# Patient Record
Sex: Male | Born: 1960 | Race: White | Hispanic: No | Marital: Married | Smoking: Former smoker
Health system: Southern US, Community
[De-identification: ages and names within clinical notes are randomized; demographics above are authoritative.]

## PROBLEM LIST (undated history)

## (undated) DIAGNOSIS — Z9889 Other specified postprocedural states: Secondary | ICD-10-CM

## (undated) DIAGNOSIS — L309 Dermatitis, unspecified: Secondary | ICD-10-CM

## (undated) DIAGNOSIS — L409 Psoriasis, unspecified: Secondary | ICD-10-CM

## (undated) DIAGNOSIS — F419 Anxiety disorder, unspecified: Secondary | ICD-10-CM

## (undated) DIAGNOSIS — R112 Nausea with vomiting, unspecified: Secondary | ICD-10-CM

## (undated) DIAGNOSIS — E785 Hyperlipidemia, unspecified: Secondary | ICD-10-CM

## (undated) DIAGNOSIS — K219 Gastro-esophageal reflux disease without esophagitis: Secondary | ICD-10-CM

## (undated) DIAGNOSIS — E119 Type 2 diabetes mellitus without complications: Secondary | ICD-10-CM

## (undated) DIAGNOSIS — E1165 Type 2 diabetes mellitus with hyperglycemia: Secondary | ICD-10-CM

## (undated) DIAGNOSIS — T7840XA Allergy, unspecified, initial encounter: Secondary | ICD-10-CM

## (undated) DIAGNOSIS — J9819 Other pulmonary collapse: Secondary | ICD-10-CM

## (undated) DIAGNOSIS — Z8619 Personal history of other infectious and parasitic diseases: Secondary | ICD-10-CM

## (undated) DIAGNOSIS — M1990 Unspecified osteoarthritis, unspecified site: Secondary | ICD-10-CM

## (undated) DIAGNOSIS — E039 Hypothyroidism, unspecified: Secondary | ICD-10-CM

## (undated) DIAGNOSIS — R011 Cardiac murmur, unspecified: Secondary | ICD-10-CM

## (undated) HISTORY — PX: COLONOSCOPY W/ POLYPECTOMY: SHX1380

## (undated) HISTORY — PX: CYST REMOVAL HAND: SHX6279

## (undated) HISTORY — DX: Dermatitis, unspecified: L30.9

## (undated) HISTORY — PX: SHOULDER ARTHROSCOPY WITH SUBACROMIAL DECOMPRESSION: SHX5684

## (undated) HISTORY — PX: JOINT REPLACEMENT: SHX530

## (undated) HISTORY — PX: INCISION AND DRAINAGE PERIRECTAL ABSCESS: SHX1804

## (undated) HISTORY — DX: Type 2 diabetes mellitus without complications: E11.9

## (undated) HISTORY — PX: WISDOM TOOTH EXTRACTION: SHX21

## (undated) HISTORY — PX: CHEST TUBE INSERTION: SHX231

## (undated) HISTORY — PX: SHOULDER ARTHROSCOPY W/ LABRAL REPAIR: SHX2399

## (undated) HISTORY — DX: Allergy, unspecified, initial encounter: T78.40XA

## (undated) HISTORY — DX: Type 2 diabetes mellitus with hyperglycemia: E11.65

## (undated) HISTORY — DX: Personal history of other infectious and parasitic diseases: Z86.19

## (undated) HISTORY — PX: OTHER SURGICAL HISTORY: SHX169

---

## 2001-01-12 HISTORY — PX: SHOULDER ARTHROSCOPY WITH ROTATOR CUFF REPAIR: SHX5685

## 2007-01-13 HISTORY — PX: ELBOW SURGERY: SHX618

## 2009-01-12 HISTORY — PX: COLONOSCOPY: SHX174

## 2009-01-12 HISTORY — PX: ROTATOR CUFF REPAIR: SHX139

## 2014-01-25 ENCOUNTER — Ambulatory Visit: Payer: BLUE CROSS/BLUE SHIELD | Attending: Orthopaedic Surgery | Admitting: Physical Therapy

## 2014-01-25 DIAGNOSIS — M545 Low back pain: Secondary | ICD-10-CM | POA: Diagnosis not present

## 2014-01-25 DIAGNOSIS — M79604 Pain in right leg: Secondary | ICD-10-CM | POA: Diagnosis not present

## 2014-01-25 DIAGNOSIS — M479 Spondylosis, unspecified: Secondary | ICD-10-CM | POA: Diagnosis not present

## 2014-01-25 DIAGNOSIS — Q057 Lumbar spina bifida without hydrocephalus: Secondary | ICD-10-CM | POA: Diagnosis not present

## 2014-01-26 ENCOUNTER — Ambulatory Visit: Payer: BLUE CROSS/BLUE SHIELD | Admitting: Physical Therapy

## 2014-01-26 DIAGNOSIS — M545 Low back pain: Secondary | ICD-10-CM | POA: Diagnosis not present

## 2014-02-09 ENCOUNTER — Ambulatory Visit: Payer: BLUE CROSS/BLUE SHIELD | Admitting: Physical Therapy

## 2014-02-09 DIAGNOSIS — M545 Low back pain: Secondary | ICD-10-CM | POA: Diagnosis not present

## 2014-02-16 ENCOUNTER — Ambulatory Visit: Payer: BLUE CROSS/BLUE SHIELD | Attending: Orthopaedic Surgery | Admitting: Physical Therapy

## 2014-02-16 DIAGNOSIS — M545 Low back pain: Secondary | ICD-10-CM | POA: Insufficient documentation

## 2014-02-16 DIAGNOSIS — M79604 Pain in right leg: Secondary | ICD-10-CM | POA: Insufficient documentation

## 2014-02-16 DIAGNOSIS — M479 Spondylosis, unspecified: Secondary | ICD-10-CM | POA: Insufficient documentation

## 2014-02-16 DIAGNOSIS — Q057 Lumbar spina bifida without hydrocephalus: Secondary | ICD-10-CM | POA: Insufficient documentation

## 2014-02-19 ENCOUNTER — Ambulatory Visit: Payer: BLUE CROSS/BLUE SHIELD | Admitting: Physical Therapy

## 2014-02-20 ENCOUNTER — Ambulatory Visit: Payer: BLUE CROSS/BLUE SHIELD | Admitting: Physical Therapy

## 2014-02-20 DIAGNOSIS — M545 Low back pain: Secondary | ICD-10-CM | POA: Diagnosis not present

## 2014-02-23 ENCOUNTER — Encounter: Payer: BLUE CROSS/BLUE SHIELD | Admitting: Physical Therapy

## 2014-03-02 ENCOUNTER — Ambulatory Visit: Payer: BLUE CROSS/BLUE SHIELD | Admitting: Physical Therapy

## 2014-03-02 ENCOUNTER — Encounter: Payer: Self-pay | Admitting: Physical Therapy

## 2014-03-02 DIAGNOSIS — M545 Low back pain: Secondary | ICD-10-CM | POA: Diagnosis not present

## 2014-03-02 DIAGNOSIS — M5137 Other intervertebral disc degeneration, lumbosacral region: Secondary | ICD-10-CM

## 2014-03-02 NOTE — Therapy (Signed)
[facility] [address] [place], [place], [place], [address] Phone: [phone]   Fax:  [phone]  Physical Therapy Treatment  Patient Details  Name: Shaun Velasquez MRN: [id number] Date of Birth: 1960/03/21 Referring Provider:  Lauraine Rinne, MD  Encounter Date: 03/02/2014      PT End of Session - 03/02/14 1153    Visit Number 5   Number of Visits 16   Date for PT Re-Evaluation 03/22/14   PT Start Time 1100   PT Stop Time 1146   PT Time Calculation (min) 46 min   Activity Tolerance Patient tolerated treatment well   Behavior During Therapy [facility] for tasks assessed/performed      No past medical history on file.  No past surgical history on file.  There were no vitals taken for this visit.  Visit Diagnosis:  Degeneration of lumbar or lumbosacral intervertebral disc      Subjective Assessment - 03/02/14 1113    Symptoms pain in lower back right side   Limitations --  bending, difficult placing on shoes or socks   How long can you sit comfortably? need to change positions, not more than an hour   How long can you stand comfortably? 20 to 30 min   How long can you walk comfortably? 45 minutes   Currently in Pain? Yes   Pain Score 2    Pain Location Back   Pain Orientation Right;Lower   Pain Descriptors / Indicators Constant;Aching;Radiating   Pain Type Chronic pain   Pain Onset More than a month ago   Pain Frequency Constant   Effect of Pain on Daily Activities limited with his perswonal exercise routine, placing on socks and shoes, bending    Multiple Pain Sites No                    [facility] Adult PT Treatment/Exercise - 03/02/14 0001    Exercises   Exercises Knee/Hip;Lumbar   Lumbar Exercises: Stretches   Active Hamstring Stretch 3 reps;20 seconds   Single Knee to Chest Stretch 3 reps;20 seconds   Lower Trunk Rotation 3 reps;20 seconds   Knee/Hip Exercises: Aerobic   Stationary Bike level 1  x17min                   PT Short Term Goals - 03/02/14 1157    PT SHORT TERM GOAL #1   Title see LTG           PT Long Term Goals - 03/02/14 1157    PT LONG TERM GOAL #1   Title demonstrate and/or verbalize techniques to reduce the risk of re-injury to include info on:  posture, body mechanics, lifting   Time 8   Period Weeks   Status Achieved   PT LONG TERM GOAL #2   Title be independent with advanced HEP   Time 8   Period Weeks   Status On-going   PT LONG TERM GOAL #3   Title sit for 1-2 hour plane ride with pain decreased >/= 75% and is intermittent   Time 8   Period Weeks   Status On-going   PT LONG TERM GOAL #4   Title return to raod bidking with pain reduced >/= 75%   Time 8   Period Weeks   Status On-going   PT LONG TERM GOAL #5   Title return to yardwork with correct body mechanics   Time 8   Period Weeks   Status On-going  Problem List There are no active problems to display for this patient.   NAUMANN-HOUEGNIFIO,Afnan Emberton PTA 03/02/2014, 12:17 PM  [facility] [address] [place], [place], [place], [address] Phone: [phone]   Fax:  [phone]

## 2014-03-02 NOTE — Patient Instructions (Addendum)
  Double Knee to Chest (Flexion)   Gently pull both knees toward chest. Feel stretch in lower back or buttock area. Breathing deeply, Hold _20___ seconds. Repeat __3__ times. Do __2-3__ sessions per day.  http://gt2.exer.us/227   Copyright  VHI. All rights reserved.  Hip Rotation in Supine   Lie on back, legs over ball. Rotate legs from side to side. Do ___ sets of ___ repetitions.  Copyright  VHI. All rights reserved.   Hamstring Step 1   Straighten left knee. Keep knee level with other knee or on bolster. Hold __20_ seconds. Relax knee by returning foot to start. Repeat _3__ times.  Copyright  VHI. All rights reserved.  Supine Knee-to-Chest, Unilateral   Lie on back, hands clasped behind one knee. Pull knee in toward chest until a comfortable stretch is felt in lower back and buttocks. Hold __20_ seconds.  Repeat _3__ times per session. Do _2-3__ sessions per day.  Copyright  VHI. All rights reserved.

## 2014-03-09 ENCOUNTER — Ambulatory Visit: Payer: BLUE CROSS/BLUE SHIELD | Admitting: Physical Therapy

## 2014-03-09 ENCOUNTER — Encounter: Payer: Self-pay | Admitting: Physical Therapy

## 2014-03-09 DIAGNOSIS — M5137 Other intervertebral disc degeneration, lumbosacral region: Secondary | ICD-10-CM

## 2014-03-09 DIAGNOSIS — M545 Low back pain: Secondary | ICD-10-CM | POA: Diagnosis not present

## 2014-03-09 NOTE — Therapy (Signed)
[facility] [address] [place], [place], [address] Phone: [phone]   Fax:  [phone]  Physical Therapy Treatment  Patient Details  Name: Shaun Velasquez MRN: [id number] Date of Birth: 1960/04/03 Referring Provider:  Marybelle Killings, MD  Encounter Date: 03/09/2014      PT End of Session - 03/09/14 1158    Visit Number 6   Number of Visits 16   Date for PT Re-Evaluation 03/22/14   PT Start Time 1100   PT Stop Time 1150   PT Time Calculation (min) 50 min   Activity Tolerance Patient tolerated treatment well   Behavior During Therapy [facility] for tasks assessed/performed      History reviewed. No pertinent past medical history.  History reviewed. No pertinent past surgical history.  There were no vitals taken for this visit.  Visit Diagnosis:  Degeneration of lumbar or lumbosacral intervertebral disc - Plan: CANCELED: PT plan of care cert/re-cert      Subjective Assessment - 03/09/14 1107    Symptoms I am seeing the doctor today. No significant improvement with therapy. Patient is having right lumbar into right posterior thigh.  Right thigh pain is worse.    Limitations House hold activities;Sitting  trouble bending forward to tie shoes   How long can you sit comfortably? freqently changes postion and unable to sit more than an hour.    How long can you stand comfortably? 20-30 minutes   How long can you walk comfortably? 45 minutes   Currently in Pain? Yes   Pain Score 3    Pain Location Back   Pain Orientation Right;Lower   Pain Descriptors / Indicators Constant;Aching;Radiating   Pain Type Chronic pain   Pain Onset More than a month ago   Pain Frequency Constant   Aggravating Factors  sitting and walking.    Effect of Pain on Daily Activities limited with his personal exercise routing, placing on socks and shoes, bending   Multiple Pain Sites Yes   Pain Score 2   Wong-Baker Pain Rating Hurts a little bit    Pain Type Chronic pain   Pain Location Leg   Pain Orientation Right   Pain Radiating Towards towards right calf   Pain Descriptors / Indicators Radiating   Pain Frequency Constant   Pain Onset Gradual          [facility] PT Assessment - 03/09/14 0001    ROM / Strength   AROM / PROM / Strength --  lumbar flexion is full, abdominal strength is 4/5   Straight Leg Raise   Findings Positive   Side  Right   Comment 55 degrees with increased pain 5/10                  [facility] Adult PT Treatment/Exercise - 03/09/14 0001    Lumbar Exercises: Stretches   Active Hamstring Stretch 3 reps;20 seconds  increased pain in right ischial tuberosity area, 5-6/10   Lumbar Exercises: Prone   Opposite Arm/Leg Raise Limitations 5 x  pillow under pelvis with VC to not lift where pain is    Knee/Hip Exercises: Stretches   Piriformis Stretch 2 reps;30 seconds   Modalities   Modalities Ultrasound   Ultrasound   Ultrasound Location right gluteal   Ultrasound Parameters 100%, 77mz, 1.2w/cm2   Ultrasound Goals Pain   Manual Therapy   Manual Therapy Massage   Massage right gluteal area  PT Education - 03/09/14 1158    Education provided Yes   Person(s) Educated Patient   Methods Explanation;Demonstration;Tactile cues   Comprehension Verbalized understanding;Returned demonstration;Verbal cues required          PT Short Term Goals - 03/09/14 1544    PT SHORT TERM GOAL #1   Title see LTG           PT Long Term Goals - 03/09/14 1150    PT LONG TERM GOAL #1   Title demonstrate and/or verbalize techniques to reduce the risk of re-injury to include info on:  posture, body mechanics, lifting   Time 8   Period Weeks   Status Achieved   PT LONG TERM GOAL #2   Title be independent with advanced HEP   Time 8   Period Weeks   Status Achieved   PT LONG TERM GOAL #3   Title sit for 1-2 hour plane ride with pain decreased >/= 75% and is intermittent  only able to  sit for 1 hour   Time 8   Period Weeks   Status Not Met   PT LONG TERM GOAL #4   Title return to road biking with pain reduced >/= 75%  not able to road bike due to pain   Time 8   Status Not Met   PT LONG TERM GOAL #5   Title return to yardwork with correct body mechanics  not met due to pain   Time 8   Period Weeks   Status Not Met               Plan - 03/09/14 1159    Clinical Impression Statement Patient continues to have a spasm in right gluteal.  Patient has made slight improvement in pain but only will last for 1 day.    Rehab Potential Good   PT Treatment/Interventions Therapeutic exercise;Ultrasound;Manual techniques   PT Next Visit Plan Discharge patient to a HEP   Consulted and Agree with Plan of Care Patient        Problem List There are no active problems to display for this patient.   Essa Malachi, PT 03/09/2014, 4:09 PM  [facility] [address], [place], [place], [address] Phone: [phone]   Fax:  [phone]  PHYSICAL THERAPY DISCHARGE SUMMARY  Visits from Start of Care: 6 Current functional level related to goals / functional outcomes: See above note for measurements and goals update.  Patient is not able to sit more than 1 hour.  Patient has not returned to yard work or road bike due to pain.   Remaining deficits: Patient continues to have right lumbar pain that will radiate into the calf.  Patient has not made significant changes with his pain.    Education / Equipment: HEP Plan: Patient agrees to discharge.  Patient goals were partially met. Patient is being discharged due to lack of progress. Thank you for the referral. ?????     Earlie Counts PT

## 2014-03-09 NOTE — Patient Instructions (Signed)
Reviewed patients past HEP.  Patient is able to return demonstration correctly after minimal verbal cues.

## 2014-03-12 ENCOUNTER — Other Ambulatory Visit: Payer: Self-pay | Admitting: Orthopaedic Surgery

## 2014-03-12 DIAGNOSIS — M549 Dorsalgia, unspecified: Secondary | ICD-10-CM

## 2014-03-18 ENCOUNTER — Ambulatory Visit
Admission: RE | Admit: 2014-03-18 | Discharge: 2014-03-18 | Disposition: A | Discharge status: Home / Self Care | Payer: BLUE CROSS/BLUE SHIELD | Source: Ambulatory Visit | Attending: Orthopaedic Surgery | Admitting: Orthopaedic Surgery

## 2014-03-18 DIAGNOSIS — M549 Dorsalgia, unspecified: Secondary | ICD-10-CM

## 2014-03-22 ENCOUNTER — Other Ambulatory Visit: Payer: Self-pay | Admitting: Orthopaedic Surgery

## 2014-03-22 DIAGNOSIS — M79605 Pain in left leg: Secondary | ICD-10-CM

## 2014-03-22 DIAGNOSIS — M545 Low back pain: Secondary | ICD-10-CM

## 2014-03-22 DIAGNOSIS — M79604 Pain in right leg: Secondary | ICD-10-CM

## 2014-03-23 ENCOUNTER — Ambulatory Visit
Admission: RE | Admit: 2014-03-23 | Discharge: 2014-03-23 | Disposition: A | Discharge status: Home / Self Care | Payer: BLUE CROSS/BLUE SHIELD | Source: Ambulatory Visit | Attending: Orthopaedic Surgery | Admitting: Orthopaedic Surgery

## 2014-03-23 DIAGNOSIS — M545 Low back pain: Secondary | ICD-10-CM

## 2014-03-23 DIAGNOSIS — M79604 Pain in right leg: Secondary | ICD-10-CM

## 2014-03-23 MED ORDER — IOHEXOL 180 MG/ML  SOLN
1.0000 mL | Freq: Once | INTRAMUSCULAR | Status: AC | PRN
  Administered 2014-03-23: 1 mL via EPIDURAL

## 2014-03-23 MED ORDER — METHYLPREDNISOLONE ACETATE 40 MG/ML INJ SUSP (RADIOLOG
120.0000 mg | Freq: Once | INTRAMUSCULAR | Status: AC
  Administered 2014-03-23: 120 mg via EPIDURAL

## 2014-03-23 NOTE — Discharge Instructions (Signed)

## 2014-12-13 ENCOUNTER — Encounter (HOSPITAL_COMMUNITY): Payer: Self-pay | Admitting: Pharmacy Technician

## 2014-12-13 NOTE — Pre-Procedure Instructions (Signed)
Shaun Velasquez  12/13/2014     Your procedure is scheduled on : Thursday December 27, 2014 at 7:30 AM.  Report to [facility] Admitting at 5:30 AM.  Call this number if you have problems the morning of surgery: [phone]    Remember:  Do not eat food or drink liquids after midnight.  Take these medicines the morning of surgery with A SIP OF WATER : Levothyroxine (Synthroid), Omeprazole (Prilosec)   Stop taking any vitamins, herbal medications, Ibuprofen, Advil, Motrin, Aleve, etc on Thursday December 8th   Do not wear jewelry.  Do not wear lotions, powders, or cologne.    Men may shave face and neck.  Do not bring valuables to the hospital.  [facility] is not responsible for any belongings or valuables.  Contacts, dentures or bridgework may not be worn into surgery.  Leave your suitcase in the car.  After surgery it may be brought to your room.  For patients admitted to the hospital, discharge time will be determined by your treatment team.  Patients discharged the day of surgery will not be allowed to drive home.   Name and phone number of your driver:    Special instructions:  Shower using CHG soap the night before and the morning of your surgery  Please read over the following fact sheets that you were given. Pain Booklet, Coughing and Deep Breathing, MRSA Information and Surgical Site Infection Prevention

## 2014-12-14 ENCOUNTER — Encounter (HOSPITAL_COMMUNITY): Payer: Self-pay

## 2014-12-14 ENCOUNTER — Encounter (HOSPITAL_COMMUNITY)
Admission: RE | Admit: 2014-12-14 | Discharge: 2014-12-14 | Disposition: A | Discharge status: Home / Self Care | Payer: BLUE CROSS/BLUE SHIELD | Source: Ambulatory Visit | Attending: Orthopedic Surgery | Admitting: Orthopedic Surgery

## 2014-12-14 ENCOUNTER — Other Ambulatory Visit (HOSPITAL_COMMUNITY): Payer: BLUE CROSS/BLUE SHIELD

## 2014-12-14 DIAGNOSIS — Z01812 Encounter for preprocedural laboratory examination: Secondary | ICD-10-CM | POA: Diagnosis not present

## 2014-12-14 DIAGNOSIS — M75101 Unspecified rotator cuff tear or rupture of right shoulder, not specified as traumatic: Secondary | ICD-10-CM | POA: Insufficient documentation

## 2014-12-14 HISTORY — DX: Nausea with vomiting, unspecified: R11.2

## 2014-12-14 HISTORY — DX: Hypothyroidism, unspecified: E03.9

## 2014-12-14 HISTORY — DX: Hyperlipidemia, unspecified: E78.5

## 2014-12-14 HISTORY — DX: Anxiety disorder, unspecified: F41.9

## 2014-12-14 HISTORY — DX: Other pulmonary collapse: J98.19

## 2014-12-14 HISTORY — DX: Psoriasis, unspecified: L40.9

## 2014-12-14 HISTORY — DX: Gastro-esophageal reflux disease without esophagitis: K21.9

## 2014-12-14 HISTORY — DX: Unspecified osteoarthritis, unspecified site: M19.90

## 2014-12-14 HISTORY — DX: Other specified postprocedural states: Z98.890

## 2014-12-14 LAB — COMPREHENSIVE METABOLIC PANEL
ALT: 53 U/L (ref 17–63)
AST: 34 U/L (ref 15–41)
Albumin: 4.3 g/dL (ref 3.5–5.0)
Alkaline Phosphatase: 68 U/L (ref 38–126)
Anion gap: 10 (ref 5–15)
BUN: 15 mg/dL (ref 6–20)
CO2: 24 mmol/L (ref 22–32)
Calcium: 9.5 mg/dL (ref 8.9–10.3)
Chloride: 101 mmol/L (ref 101–111)
Creatinine, Ser: 0.92 mg/dL (ref 0.61–1.24)
GFR calc Af Amer: >60 mL/min (ref >60)
GFR calc non Af Amer: >60 mL/min (ref >60)
Glucose, Bld: 134 mg/dL — ABNORMAL HIGH (ref 65–99)
Potassium: 4.3 mmol/L (ref 3.5–5.1)
Sodium: 135 mmol/L (ref 135–145)
Total Bilirubin: 0.9 mg/dL (ref 0.3–1.2)
Total Protein: 7.2 g/dL (ref 6.5–8.1)

## 2014-12-14 LAB — CBC WITH DIFFERENTIAL/PLATELET
Basophils Absolute: 0.1 10*3/uL (ref 0.0–0.1)
Basophils Relative: 1 %
Eosinophils Absolute: 0.2 10*3/uL (ref 0.0–0.7)
Eosinophils Relative: 3 %
HCT: 44.6 % (ref 39.0–52.0)
Hemoglobin: 14.9 g/dL (ref 13.0–17.0)
Lymphocytes Relative: 25 %
Lymphs Abs: 1.7 10*3/uL (ref 0.7–4.0)
MCH: 29.3 pg (ref 26.0–34.0)
MCHC: 33.4 g/dL (ref 30.0–36.0)
MCV: 87.6 fL (ref 78.0–100.0)
Monocytes Absolute: 0.4 10*3/uL (ref 0.1–1.0)
Monocytes Relative: 6 %
Neutro Abs: 4.5 10*3/uL (ref 1.7–7.7)
Neutrophils Relative %: 65 %
Platelets: 189 10*3/uL (ref 150–400)
RBC: 5.09 MIL/uL (ref 4.22–5.81)
RDW: 12.6 % (ref 11.5–15.5)
WBC: 6.8 10*3/uL (ref 4.0–10.5)

## 2014-12-14 LAB — SURGICAL PCR SCREEN
MRSA, PCR: NEGATIVE
Staphylococcus aureus: POSITIVE — AB

## 2014-12-14 LAB — PROTIME-INR
INR: 0.98 (ref 0.00–1.49)
Prothrombin Time: 13.2 seconds (ref 11.6–15.2)

## 2014-12-14 LAB — APTT: aPTT: 32 seconds (ref 24–37)

## 2014-12-14 NOTE — Progress Notes (Signed)
PCP is [name]  Patient denied having any cardiac or pulmonary issues, but did inform Nurse that he developed a "head cold" and a cough over Thanksgiving. Patient informed Nurse that he is coughing up some clear sputum, but he feels ok. Patient stated he had the same thing last year and went to his PCP office where he had a chest xray, and everything was fine. Nurse instructed patient to return to his PCP IF symptoms persist or became worse. Patient verbalized understanding.   Patients wife at chair side during PAT visit.

## 2014-12-14 NOTE — Progress Notes (Signed)
   12/14/14 0924  OBSTRUCTIVE SLEEP APNEA  Have you ever been diagnosed with sleep apnea through a sleep study? No  Do you snore loudly (loud enough to be heard through closed doors)?  1  Do you often feel tired, fatigued, or sleepy during the daytime (such as falling asleep during driving or talking to someone)? 1  Has anyone observed you stop breathing during your sleep? 0  Do you have, or are you being treated for high blood pressure? 0  BMI more than 35 kg/m2? 0  Age > 50 (1-yes) 1  Neck circumference greater than:Male 16 inches or larger, Male 17inches or larger? 1  Male Gender (Yes=1) 1  Obstructive Sleep Apnea Score 5  Score 5 or greater  Results sent to PCP   This patient has screened at risk for sleep apnea using the STOP Bang tool used during a pre-surgical visit. A score of 4 or greater is at risk for sleep apnea.

## 2014-12-14 NOTE — Progress Notes (Signed)
Nurse called prescription for Mupirocin ointment into [facility] pharmacy and then called patient and informed him of positive PCR results. Nurse informed patient to pick up ointment and began treatment. Patient verbalized understanding.

## 2014-12-27 ENCOUNTER — Observation Stay (HOSPITAL_COMMUNITY)
Admission: RE | Admit: 2014-12-27 | Discharge: 2014-12-28 | Disposition: A | Discharge status: Home / Self Care | Payer: BLUE CROSS/BLUE SHIELD | Source: Ambulatory Visit | Attending: Orthopedic Surgery | Admitting: Orthopedic Surgery

## 2014-12-27 ENCOUNTER — Ambulatory Visit (HOSPITAL_COMMUNITY): Payer: BLUE CROSS/BLUE SHIELD | Admitting: Certified Registered Nurse Anesthetist

## 2014-12-27 ENCOUNTER — Encounter (HOSPITAL_COMMUNITY)
Admission: RE | Disposition: A | Discharge status: Home / Self Care | Payer: Self-pay | Source: Ambulatory Visit | Attending: Orthopedic Surgery

## 2014-12-27 ENCOUNTER — Encounter (HOSPITAL_COMMUNITY): Payer: Self-pay | Admitting: Surgery

## 2014-12-27 DIAGNOSIS — Z96619 Presence of unspecified artificial shoulder joint: Secondary | ICD-10-CM

## 2014-12-27 DIAGNOSIS — M75101 Unspecified rotator cuff tear or rupture of right shoulder, not specified as traumatic: Secondary | ICD-10-CM | POA: Diagnosis not present

## 2014-12-27 DIAGNOSIS — E785 Hyperlipidemia, unspecified: Secondary | ICD-10-CM | POA: Diagnosis not present

## 2014-12-27 DIAGNOSIS — L409 Psoriasis, unspecified: Secondary | ICD-10-CM | POA: Diagnosis not present

## 2014-12-27 DIAGNOSIS — E039 Hypothyroidism, unspecified: Secondary | ICD-10-CM | POA: Insufficient documentation

## 2014-12-27 DIAGNOSIS — F419 Anxiety disorder, unspecified: Secondary | ICD-10-CM | POA: Diagnosis not present

## 2014-12-27 DIAGNOSIS — Z96611 Presence of right artificial shoulder joint: Secondary | ICD-10-CM

## 2014-12-27 DIAGNOSIS — K219 Gastro-esophageal reflux disease without esophagitis: Secondary | ICD-10-CM | POA: Diagnosis not present

## 2014-12-27 DIAGNOSIS — Z87891 Personal history of nicotine dependence: Secondary | ICD-10-CM | POA: Diagnosis not present

## 2014-12-27 DIAGNOSIS — M19011 Primary osteoarthritis, right shoulder: Secondary | ICD-10-CM | POA: Insufficient documentation

## 2014-12-27 DIAGNOSIS — Z79899 Other long term (current) drug therapy: Secondary | ICD-10-CM | POA: Insufficient documentation

## 2014-12-27 HISTORY — PX: REVERSE SHOULDER ARTHROPLASTY: SHX5054

## 2014-12-27 SURGERY — ARTHROPLASTY, SHOULDER, TOTAL, REVERSE
Anesthesia: Regional | Site: Shoulder | Laterality: Right

## 2014-12-27 MED ORDER — METHOCARBAMOL 1000 MG/10ML IJ SOLN
500.0000 mg | Freq: Four times a day (QID) | INTRAVENOUS | Status: DC | PRN
  Filled 2014-12-27: qty 5

## 2014-12-27 MED ORDER — SUCCINYLCHOLINE CHLORIDE 20 MG/ML IJ SOLN
INTRAMUSCULAR | Status: AC
  Filled 2014-12-27: qty 1

## 2014-12-27 MED ORDER — BISACODYL 5 MG PO TBEC: 5.0000 mg | DELAYED_RELEASE_TABLET | Freq: Every day | ORAL | Status: DC | PRN

## 2014-12-27 MED ORDER — GLYCOPYRROLATE 0.2 MG/ML IJ SOLN
INTRAMUSCULAR | Status: DC | PRN
  Administered 2014-12-27: 0.2 mg via INTRAVENOUS

## 2014-12-27 MED ORDER — HYDROMORPHONE HCL 1 MG/ML IJ SOLN
1.0000 mg | INTRAMUSCULAR | Status: DC | PRN
  Administered 2014-12-27: 1 mg via INTRAVENOUS
  Filled 2014-12-27: qty 1

## 2014-12-27 MED ORDER — FENTANYL CITRATE (PF) 250 MCG/5ML IJ SOLN
INTRAMUSCULAR | Status: AC
  Filled 2014-12-27: qty 5

## 2014-12-27 MED ORDER — ONDANSETRON HCL 4 MG/2ML IJ SOLN: 4.0000 mg | Freq: Four times a day (QID) | INTRAMUSCULAR | Status: DC | PRN

## 2014-12-27 MED ORDER — ALUM & MAG HYDROXIDE-SIMETH 200-200-20 MG/5ML PO SUSP: 30.0000 mL | ORAL | Status: DC | PRN

## 2014-12-27 MED ORDER — LACTATED RINGERS IV SOLN
INTRAVENOUS | Status: DC
  Administered 2014-12-27: 15:00:00 via INTRAVENOUS

## 2014-12-27 MED ORDER — CEFAZOLIN SODIUM-DEXTROSE 2-3 GM-% IV SOLR
2.0000 g | INTRAVENOUS | Status: AC
  Administered 2014-12-27: 2 g via INTRAVENOUS

## 2014-12-27 MED ORDER — ONDANSETRON HCL 4 MG/2ML IJ SOLN
INTRAMUSCULAR | Status: DC | PRN
  Administered 2014-12-27: 4 mg via INTRAVENOUS

## 2014-12-27 MED ORDER — KETOROLAC TROMETHAMINE 15 MG/ML IJ SOLN
INTRAMUSCULAR | Status: AC
  Filled 2014-12-27: qty 1

## 2014-12-27 MED ORDER — LACTATED RINGERS IV SOLN
INTRAVENOUS | Status: DC | PRN
  Administered 2014-12-27 (×2): via INTRAVENOUS

## 2014-12-27 MED ORDER — OXYCODONE HCL 5 MG PO TABS
ORAL_TABLET | ORAL | Status: AC
  Administered 2014-12-27: 10 mg via ORAL
  Filled 2014-12-27: qty 2

## 2014-12-27 MED ORDER — CEFAZOLIN SODIUM-DEXTROSE 2-3 GM-% IV SOLR
2.0000 g | Freq: Four times a day (QID) | INTRAVENOUS | Status: AC
  Administered 2014-12-27 – 2014-12-28 (×3): 2 g via INTRAVENOUS
  Filled 2014-12-27 (×4): qty 50

## 2014-12-27 MED ORDER — LACTATED RINGERS IV SOLN: INTRAVENOUS | Status: DC

## 2014-12-27 MED ORDER — HYDROMORPHONE HCL 1 MG/ML IJ SOLN
0.5000 mg | INTRAMUSCULAR | Status: DC | PRN
  Administered 2014-12-27 (×2): 0.5 mg via INTRAVENOUS

## 2014-12-27 MED ORDER — CEFAZOLIN SODIUM-DEXTROSE 2-3 GM-% IV SOLR
INTRAVENOUS | Status: AC
  Filled 2014-12-27: qty 50

## 2014-12-27 MED ORDER — PROPOFOL 10 MG/ML IV BOLUS
INTRAVENOUS | Status: DC | PRN
  Administered 2014-12-27: 150 mg via INTRAVENOUS

## 2014-12-27 MED ORDER — ATORVASTATIN CALCIUM 20 MG PO TABS
20.0000 mg | ORAL_TABLET | Freq: Every day | ORAL | Status: DC
  Administered 2014-12-27: 20 mg via ORAL
  Filled 2014-12-27: qty 1

## 2014-12-27 MED ORDER — PANTOPRAZOLE SODIUM 40 MG PO TBEC
40.0000 mg | DELAYED_RELEASE_TABLET | Freq: Every day | ORAL | Status: DC
  Administered 2014-12-27 – 2014-12-28 (×2): 40 mg via ORAL
  Filled 2014-12-27 (×2): qty 1

## 2014-12-27 MED ORDER — KETOROLAC TROMETHAMINE 15 MG/ML IJ SOLN
15.0000 mg | Freq: Four times a day (QID) | INTRAMUSCULAR | Status: AC
  Administered 2014-12-27 – 2014-12-28 (×4): 15 mg via INTRAVENOUS
  Filled 2014-12-27 (×3): qty 1

## 2014-12-27 MED ORDER — MIDAZOLAM HCL 2 MG/2ML IJ SOLN
INTRAMUSCULAR | Status: AC
  Filled 2014-12-27: qty 2

## 2014-12-27 MED ORDER — PHENYLEPHRINE 40 MCG/ML (10ML) SYRINGE FOR IV PUSH (FOR BLOOD PRESSURE SUPPORT)
PREFILLED_SYRINGE | INTRAVENOUS | Status: AC
  Filled 2014-12-27: qty 10

## 2014-12-27 MED ORDER — LIDOCAINE HCL (CARDIAC) 20 MG/ML IV SOLN
INTRAVENOUS | Status: DC | PRN
  Administered 2014-12-27: 40 mg via INTRAVENOUS

## 2014-12-27 MED ORDER — METHOCARBAMOL 500 MG PO TABS
500.0000 mg | ORAL_TABLET | Freq: Four times a day (QID) | ORAL | Status: DC | PRN
Start: 2014-12-27 — End: 2014-12-28
  Administered 2014-12-27 (×2): 500 mg via ORAL
  Filled 2014-12-27: qty 1

## 2014-12-27 MED ORDER — ONDANSETRON HCL 4 MG PO TABS: 4.0000 mg | ORAL_TABLET | Freq: Four times a day (QID) | ORAL | Status: DC | PRN

## 2014-12-27 MED ORDER — SUGAMMADEX SODIUM 200 MG/2ML IV SOLN
INTRAVENOUS | Status: DC | PRN
  Administered 2014-12-27: 200 mg via INTRAVENOUS

## 2014-12-27 MED ORDER — PROPOFOL 10 MG/ML IV BOLUS
INTRAVENOUS | Status: AC
  Filled 2014-12-27: qty 20

## 2014-12-27 MED ORDER — SUGAMMADEX SODIUM 200 MG/2ML IV SOLN
INTRAVENOUS | Status: AC
  Filled 2014-12-27: qty 2

## 2014-12-27 MED ORDER — MIDAZOLAM HCL 5 MG/5ML IJ SOLN
INTRAMUSCULAR | Status: DC | PRN
  Administered 2014-12-27: 2 mg via INTRAVENOUS

## 2014-12-27 MED ORDER — SODIUM CHLORIDE 0.9 % IR SOLN
Status: DC | PRN
  Administered 2014-12-27: 1000 mL

## 2014-12-27 MED ORDER — ROCURONIUM BROMIDE 100 MG/10ML IV SOLN
INTRAVENOUS | Status: DC | PRN
  Administered 2014-12-27: 50 mg via INTRAVENOUS
  Administered 2014-12-27 (×2): 10 mg via INTRAVENOUS

## 2014-12-27 MED ORDER — MAGNESIUM CITRATE PO SOLN: 1.0000 | Freq: Once | ORAL | Status: DC | PRN

## 2014-12-27 MED ORDER — POLYETHYLENE GLYCOL 3350 17 G PO PACK: 17.0000 g | PACK | Freq: Every day | ORAL | Status: DC | PRN

## 2014-12-27 MED ORDER — OXYCODONE HCL 5 MG PO TABS
5.0000 mg | ORAL_TABLET | ORAL | Status: DC | PRN
  Administered 2014-12-27 – 2014-12-28 (×6): 10 mg via ORAL
  Filled 2014-12-27 (×4): qty 2

## 2014-12-27 MED ORDER — TRAZODONE HCL 50 MG PO TABS
50.0000 mg | ORAL_TABLET | Freq: Every evening | ORAL | Status: DC | PRN
  Administered 2014-12-27: 50 mg via ORAL
  Filled 2014-12-27: qty 1

## 2014-12-27 MED ORDER — PHENYLEPHRINE HCL 10 MG/ML IJ SOLN
10.0000 mg | INTRAVENOUS | Status: DC | PRN
  Administered 2014-12-27: 40 ug/min via INTRAVENOUS

## 2014-12-27 MED ORDER — HYDROMORPHONE HCL 1 MG/ML IJ SOLN
INTRAMUSCULAR | Status: AC
  Administered 2014-12-27: 0.5 mg via INTRAVENOUS
  Filled 2014-12-27: qty 1

## 2014-12-27 MED ORDER — ACETAMINOPHEN 650 MG RE SUPP: 650.0000 mg | Freq: Four times a day (QID) | RECTAL | Status: DC | PRN

## 2014-12-27 MED ORDER — METOCLOPRAMIDE HCL 5 MG/ML IJ SOLN: 5.0000 mg | Freq: Three times a day (TID) | INTRAMUSCULAR | Status: DC | PRN

## 2014-12-27 MED ORDER — ACETAMINOPHEN 325 MG PO TABS: 650.0000 mg | ORAL_TABLET | Freq: Four times a day (QID) | ORAL | Status: DC | PRN

## 2014-12-27 MED ORDER — METOCLOPRAMIDE HCL 5 MG PO TABS: 5.0000 mg | ORAL_TABLET | Freq: Three times a day (TID) | ORAL | Status: DC | PRN

## 2014-12-27 MED ORDER — PHENOL 1.4 % MT LIQD: 1.0000 | OROMUCOSAL | Status: DC | PRN

## 2014-12-27 MED ORDER — FENTANYL CITRATE (PF) 100 MCG/2ML IJ SOLN
INTRAMUSCULAR | Status: DC | PRN
  Administered 2014-12-27 (×2): 100 ug via INTRAVENOUS

## 2014-12-27 MED ORDER — DOCUSATE SODIUM 100 MG PO CAPS
100.0000 mg | ORAL_CAPSULE | Freq: Two times a day (BID) | ORAL | Status: DC
  Administered 2014-12-27 – 2014-12-28 (×2): 100 mg via ORAL
  Filled 2014-12-27 (×2): qty 1

## 2014-12-27 MED ORDER — CHLORHEXIDINE GLUCONATE 4 % EX LIQD: 60.0000 mL | Freq: Once | CUTANEOUS | Status: DC

## 2014-12-27 MED ORDER — LEVOTHYROXINE SODIUM 200 MCG PO TABS
200.0000 ug | ORAL_TABLET | Freq: Every day | ORAL | Status: DC
  Administered 2014-12-28: 200 ug via ORAL
  Filled 2014-12-27: qty 1

## 2014-12-27 MED ORDER — DEXAMETHASONE SODIUM PHOSPHATE 10 MG/ML IJ SOLN
INTRAMUSCULAR | Status: DC | PRN
  Administered 2014-12-27: 10 mg via INTRAVENOUS

## 2014-12-27 MED ORDER — MENTHOL 3 MG MT LOZG: 1.0000 | LOZENGE | OROMUCOSAL | Status: DC | PRN

## 2014-12-27 MED ORDER — DIPHENHYDRAMINE HCL 12.5 MG/5ML PO ELIX: 12.5000 mg | ORAL_SOLUTION | ORAL | Status: DC | PRN

## 2014-12-27 MED ORDER — EPHEDRINE SULFATE 50 MG/ML IJ SOLN
INTRAMUSCULAR | Status: AC
  Filled 2014-12-27: qty 1

## 2014-12-27 MED ORDER — METHOCARBAMOL 500 MG PO TABS
ORAL_TABLET | ORAL | Status: AC
  Administered 2014-12-27: 500 mg via ORAL
  Filled 2014-12-27: qty 1

## 2014-12-27 MED ORDER — SODIUM CHLORIDE 0.9 % IJ SOLN
INTRAMUSCULAR | Status: AC
  Filled 2014-12-27: qty 10

## 2014-12-27 MED ORDER — TRANEXAMIC ACID 1000 MG/10ML IV SOLN
1000.0000 mg | INTRAVENOUS | Status: DC
  Filled 2014-12-27: qty 10

## 2014-12-27 MED ORDER — ONDANSETRON HCL 4 MG/2ML IJ SOLN
INTRAMUSCULAR | Status: AC
  Filled 2014-12-27: qty 2

## 2014-12-27 MED ORDER — ADULT MULTIVITAMIN W/MINERALS CH
1.0000 | ORAL_TABLET | Freq: Every day | ORAL | Status: DC
Start: 2014-12-27 — End: 2014-12-28
  Administered 2014-12-27 – 2014-12-28 (×2): 1 via ORAL
  Filled 2014-12-27 (×2): qty 1

## 2014-12-27 MED ORDER — LIDOCAINE HCL (CARDIAC) 20 MG/ML IV SOLN
INTRAVENOUS | Status: AC
  Filled 2014-12-27: qty 5

## 2014-12-27 MED ORDER — ROCURONIUM BROMIDE 50 MG/5ML IV SOLN
INTRAVENOUS | Status: AC
  Filled 2014-12-27: qty 1

## 2014-12-27 SURGICAL SUPPLY — 75 items
BASEPLATE GLENOID SHLDR SM (Shoulder) ×2 IMPLANT
BLADE SAW SGTL 83.5X18.5 (BLADE) ×2 IMPLANT
BRUSH FEMORAL CANAL (MISCELLANEOUS) IMPLANT
COVER SURGICAL LIGHT HANDLE (MISCELLANEOUS) ×2 IMPLANT
CUP SUT UNIV REVERS 39 +2 (Shoulder) ×2 IMPLANT
DERMABOND ADVANCED (GAUZE/BANDAGES/DRESSINGS) ×1
DERMABOND ADVANCED .7 DNX12 (GAUZE/BANDAGES/DRESSINGS) ×1 IMPLANT
DRAPE ORTHO SPLIT 77X108 STRL (DRAPES) ×2
DRAPE SURG 17X11 SM STRL (DRAPES) ×2 IMPLANT
DRAPE SURG ORHT 6 SPLT 77X108 (DRAPES) ×2 IMPLANT
DRAPE U-SHAPE 47X51 STRL (DRAPES) ×2 IMPLANT
DRILL BIT 7/64X5 (BIT) ×2 IMPLANT
DRSG AQUACEL AG ADV 3.5X10 (GAUZE/BANDAGES/DRESSINGS) ×2 IMPLANT
DRSG MEPILEX BORDER 4X8 (GAUZE/BANDAGES/DRESSINGS) IMPLANT
DURAPREP 26ML APPLICATOR (WOUND CARE) ×2 IMPLANT
ELECT BLADE 4.0 EZ CLEAN MEGAD (MISCELLANEOUS) ×2
ELECT CAUTERY BLADE 6.4 (BLADE) ×2 IMPLANT
ELECT REM PT RETURN 9FT ADLT (ELECTROSURGICAL) ×2
ELECTRODE BLDE 4.0 EZ CLN MEGD (MISCELLANEOUS) ×1 IMPLANT
ELECTRODE REM PT RTRN 9FT ADLT (ELECTROSURGICAL) ×1 IMPLANT
FACESHIELD WRAPAROUND (MASK) ×6 IMPLANT
GLENOSPHERE LAT 39+4 SHOULDER (Shoulder) ×2 IMPLANT
GLOVE BIO SURGEON STRL SZ7.5 (GLOVE) ×2 IMPLANT
GLOVE BIO SURGEON STRL SZ8 (GLOVE) ×2 IMPLANT
GLOVE EUDERMIC 7 POWDERFREE (GLOVE) ×2 IMPLANT
GLOVE SS BIOGEL STRL SZ 7.5 (GLOVE) ×1 IMPLANT
GLOVE SUPERSENSE BIOGEL SZ 7.5 (GLOVE) ×1
GOWN STRL REUS W/ TWL LRG LVL3 (GOWN DISPOSABLE) IMPLANT
GOWN STRL REUS W/ TWL XL LVL3 (GOWN DISPOSABLE) ×2 IMPLANT
GOWN STRL REUS W/TWL LRG LVL3 (GOWN DISPOSABLE)
GOWN STRL REUS W/TWL XL LVL3 (GOWN DISPOSABLE) ×2
HANDPIECE INTERPULSE COAX TIP (DISPOSABLE)
INSERT HUMERAL MED 39/ +3 (Shoulder) ×1 IMPLANT
INSERT MEDIUM HUMERAL 39/ +3 (Shoulder) ×1 IMPLANT
KIT BASIN OR (CUSTOM PROCEDURE TRAY) ×2 IMPLANT
KIT ROOM TURNOVER OR (KITS) ×2 IMPLANT
MANIFOLD NEPTUNE II (INSTRUMENTS) ×2 IMPLANT
NDL SUT 6 .5 CRC .975X.05 MAYO (NEEDLE) IMPLANT
NEEDLE 1/2 CIR MAYO (NEEDLE) ×2 IMPLANT
NEEDLE HYPO 25GX1X1/2 BEV (NEEDLE) IMPLANT
NEEDLE MAYO TAPER (NEEDLE)
NS IRRIG 1000ML POUR BTL (IV SOLUTION) ×2 IMPLANT
PACK SHOULDER (CUSTOM PROCEDURE TRAY) ×2 IMPLANT
PAD ARMBOARD 7.5X6 YLW CONV (MISCELLANEOUS) ×4 IMPLANT
PASSER SUT SWANSON 36MM LOOP (INSTRUMENTS) IMPLANT
PRESSURIZER FEMORAL UNIV (MISCELLANEOUS) IMPLANT
RESTRAINT HEAD UNIVERSAL NS (MISCELLANEOUS) ×2 IMPLANT
SCREW CENTRAL NONLOCK 25MM (Screw) ×2 IMPLANT
SCREW LOCK PERIPHERAL 30MM (Shoulder) ×2 IMPLANT
SCREW LOCK PERIPHERAL 42MM (Screw) ×2 IMPLANT
SET HNDPC FAN SPRY TIP SCT (DISPOSABLE) IMPLANT
SET PIN UNIVERSAL REVERSE (SET/KITS/TRAYS/PACK) ×2 IMPLANT
SLING ARM FOAM STRAP LRG (SOFTGOODS) ×2 IMPLANT
SPONGE LAP 18X18 X RAY DECT (DISPOSABLE) ×6 IMPLANT
SPONGE LAP 4X18 X RAY DECT (DISPOSABLE) ×2 IMPLANT
STEM UNI REVERS SZ 10 CAP COAT (Shoulder) ×2 IMPLANT
SUCTION FRAZIER TIP 10 FR DISP (SUCTIONS) ×2 IMPLANT
SUT BONE WAX W31G (SUTURE) IMPLANT
SUT FIBERWIRE #2 38 T-5 BLUE (SUTURE) ×6
SUT MNCRL AB 3-0 PS2 18 (SUTURE) ×2 IMPLANT
SUT MON AB 2-0 CT1 36 (SUTURE) ×2 IMPLANT
SUT VIC AB 1 CT1 27 (SUTURE) ×1
SUT VIC AB 1 CT1 27XBRD ANBCTR (SUTURE) ×1 IMPLANT
SUT VIC AB 2-0 CT1 27 (SUTURE) ×1
SUT VIC AB 2-0 CT1 TAPERPNT 27 (SUTURE) ×1 IMPLANT
SUT VIC AB 2-0 SH 27 (SUTURE)
SUT VIC AB 2-0 SH 27X BRD (SUTURE) IMPLANT
SUTURE FIBERWR #2 38 T-5 BLUE (SUTURE) ×3 IMPLANT
SYR 30ML SLIP (SYRINGE) ×2 IMPLANT
SYR CONTROL 10ML LL (SYRINGE) IMPLANT
TOWEL OR 17X24 6PK STRL BLUE (TOWEL DISPOSABLE) ×2 IMPLANT
TOWEL OR 17X26 10 PK STRL BLUE (TOWEL DISPOSABLE) ×2 IMPLANT
TOWER CARTRIDGE SMART MIX (DISPOSABLE) IMPLANT
TRAY FOLEY CATH 16FRSI W/METER (SET/KITS/TRAYS/PACK) IMPLANT
WATER STERILE IRR 1000ML POUR (IV SOLUTION) ×2 IMPLANT

## 2014-12-27 NOTE — Anesthesia Procedure Notes (Addendum)
Procedure Name: Intubation Date/Time: 12/27/2014 8:03 AM Performed by: Carney Living Pre-anesthesia Checklist: Patient identified, Emergency Drugs available, Suction available, Patient being monitored and Timeout performed Patient Re-evaluated:Patient Re-evaluated prior to inductionOxygen Delivery Method: Circle system utilized Preoxygenation: Pre-oxygenation with 100% oxygen Intubation Type: IV induction Ventilation: Mask ventilation without difficulty and Oral airway inserted - appropriate to patient size Laryngoscope Size: Mac and 4 Grade View: Grade II Tube type: Oral Tube size: 7.5 mm Number of attempts: 1 Airway Equipment and Method: Stylet Placement Confirmation: ETT inserted through vocal cords under direct vision,  positive ETCO2 and breath sounds checked- equal and bilateral Secured at: 22 cm Tube secured with: Tape Dental Injury: Teeth and Oropharynx as per pre-operative assessment    Anesthesia Regional Block:  Interscalene brachial plexus block  Pre-Anesthetic Checklist: ,, timeout performed, Correct Patient, Correct Site, Correct Laterality, Correct Procedure, Correct Position, site marked, Risks and benefits discussed,  Surgical consent,  Pre-op evaluation,  At surgeon's request and post-op pain management  Laterality: Right  Prep: chloraprep       Needles:  Injection technique: Single-shot  Needle Type: Echogenic Stimulator Needle     Needle Length: 9cm 9 cm Needle Gauge: 21 and 21 G    Additional Needles:  Procedures: ultrasound guided (picture in chart) and nerve stimulator Interscalene brachial plexus block  Nerve Stimulator or Paresthesia:  Response: 0.4 mA,   Additional Responses:   Narrative:  Start time: 12/28/2014 7:30 AM End time: 12/28/2014 7:40 AM Injection made incrementally with aspirations every 5 mL.  Performed by: Personally  Anesthesiologist: Lillia Abed  Additional Notes: Monitors applied. Patient sedated. Sterile prep  and drape,hand hygiene and sterile gloves were used. Relevant anatomy identified.Needle position confirmed.Local anesthetic injected incrementally after negative aspiration. Local anesthetic spread visualized around nerve(s). Vascular puncture avoided. No complications. Image printed for medical record.The patient tolerated the procedure well.

## 2014-12-27 NOTE — Anesthesia Postprocedure Evaluation (Signed)
Anesthesia Post Note  Patient: Shaun Velasquez  Procedure(s) Performed: Procedure(s) (LRB): RIGHT REVERSE SHOULDER ARTHROPLASTY (Right)  Patient location during evaluation: PACU Anesthesia Type: General Level of consciousness: awake and alert Pain management: pain level controlled Vital Signs Assessment: post-procedure vital signs reviewed and stable Respiratory status: spontaneous breathing, nonlabored ventilation, respiratory function stable and patient connected to nasal cannula oxygen Cardiovascular status: blood pressure returned to baseline and stable Postop Assessment: no signs of nausea or vomiting Anesthetic complications: no    Last Vitals:  Filed Vitals:   12/27/14 1345 12/27/14 1414  BP:  124/86  Pulse: 82 85  Temp: 36.7 C 36.4 C  Resp: 15 16    Last Pain:  Filed Vitals:   12/27/14 1436  PainSc: 3                  Maxime Beckner DAVID

## 2014-12-27 NOTE — Anesthesia Preprocedure Evaluation (Addendum)
Anesthesia Evaluation  Patient identified by MRN, date of birth, ID band Patient awake    Reviewed: Allergy & Precautions, NPO status , Patient's Chart, lab work & pertinent test results  History of Anesthesia Complications (+) PONV  Airway Mallampati: II  TM Distance: >3 FB Neck ROM: Full    Dental  (+) Teeth Intact, Dental Advisory Given   Pulmonary former smoker,    Pulmonary exam normal        Cardiovascular Normal cardiovascular exam     Neuro/Psych    GI/Hepatic GERD  Medicated and Controlled,  Endo/Other  Hypothyroidism   Renal/GU      Musculoskeletal  (+) Arthritis , Osteoarthritis,    Abdominal   Peds  Hematology   Anesthesia Other Findings   Reproductive/Obstetrics                            Anesthesia Physical Anesthesia Plan  ASA: II  Anesthesia Plan: General and Regional   Post-op Pain Management: MAC Combined w/ Regional for Post-op pain and GA combined w/ Regional for post-op pain   Induction: Intravenous  Airway Management Planned: Oral ETT  Additional Equipment:   Intra-op Plan:   Post-operative Plan: Extubation in OR  Informed Consent: I have reviewed the patients History and Physical, chart, labs and discussed the procedure including the risks, benefits and alternatives for the proposed anesthesia with the patient or authorized representative who has indicated his/her understanding and acceptance.   Dental advisory given  Plan Discussed with: CRNA and Surgeon  Anesthesia Plan Comments:        Anesthesia Quick Evaluation

## 2014-12-27 NOTE — Op Note (Signed)
Shaun Velasquez, Shaun Velasquez NO.:  [id number]  MEDICAL RECORD NO.:  [id number]  LOCATION:  5N30C                        FACILITY:  [facility]  PHYSICIAN:  Metta Clines. Sallie Staron, M.D.  DATE OF BIRTH:  10/01/1960  DATE OF PROCEDURE:  12/27/2014 DATE OF DISCHARGE:                              OPERATIVE REPORT   PREOPERATIVE DIAGNOSIS:  End-stage right shoulder rotator cuff tear arthropathy.  POSTOPERATIVE DIAGNOSIS:  End-stage right shoulder rotator cuff tear arthropathy.  PROCEDURE:  Right shoulder reverse arthroplasty utilizing an Arthrex reverse implant system a size small base plate with screw fixation.  A 39+ 4 Glenosphere.  A +10 press-fit stem and a +3 polyethylene insert.  SURGEON:  Metta Clines. Yajaira Doffing, M.D.  Terrence DupontOlivia Mackie A. Shuford, P.A.-C.  ANESTHESIA:  General endotracheal as well as an interscalene block.  ESTIMATED BLOOD LOSS:  200 mL.  DRAINS:  None.  HISTORY:  Shaun Velasquez is a 54 year old gentleman, who has had chronic and progressive increasing right shoulder pain related to rotator cuff tear arthropathy with a significant glenohumeral arthritis complement.  His shoulders are now causing severe pain, functional limitations, deterioration in quality of life.  He has failed all attempts at conservative management, and he is brought to the operating room at this time for planned right shoulder reverse arthroplasty.  Preoperatively, I counseled Shaun Velasquez regarding treatment options and potential risks versus benefits thereof.  Possible surgical complications were all reviewed including bleeding, infection, neurovascular injury, persistent pain, loss of motion, anesthetic complication, failure of the implant, and possible need for additional surgery.  He understands and accepts and agrees with our planned procedure.  PROCEDURE IN DETAIL:  After undergoing routine preop evaluation, the patient did receive prophylactic antibiotics.  An interscalene block  was established in holding area by the Anesthesia Department.  Placed supine on the operating table, underwent smooth induction of a general endotracheal anesthesia.  Placed in beach-chair position and appropriately padded and protected.  The right shoulder girdle region was sterilely prepped and draped in standard fashion.  The patient did receive tranexamic acid.  Time-out was called.  An anterior approach to the right shoulder was made through deltopectoral incision approximately 10 cm in length.  Skin flaps were elevated.  Electrocautery was used for hemostasis.  The deltopectoral interval was identified, cephalic vein taken laterally with the deltoid.  Upper 1 cm pec major was tenotomized to enhance exposure.  We mobilized the conjoint tendon, retracted medially, and then divided adhesions from beneath the deltoid.  We then unroofed the biceps tendon, tenotomized this for later tenodesis, and then we split the rotator cuff along the rotator interval from the apex of the bicipital groove to the base of the coracoid, and then we performed a peel off the subscapularis tendon from the lesser tuberosity and tagged the free margin with a series of #2 FiberWire sutures, grasping suture technique.  We then divided the capsular tissues at the anteroinferior and inferior aspects of the humeral head.  Care taken to confirm that we completely released the capsular tissues back well past the 6 o'clock position.  We then delivered the humeral head through the wound which showed marked  osteophytic spurring.  We outlined our proposed humeral head resection at approximately 20 degrees retroversion, performed this with an oscillating saw, care taken to protect the posterior cuff that was still intact.  We then removed the osteophytes on the anterior and inferior aspects of the humeral head and placed a metal cap over the cut metaphyseal surface to protect the area. At this point, we then proceeded  with additional soft tissue releases such that we could gain exposure of the glenoid, and this did show significant peripheral osteophytic spurring with some retroversion.  I performed a circumferential labral resection and then mobilized the subscap and gained complete exposure of the periphery of the glenoid. Used a rongeur to remove some osteophytes on the inferior and anterior aspects of the glenoid.  A guide pin was then placed into the center of the glenoid, and then we did the appropriate central peg ream followed by the peripheral reamer, and we repeated this twice such that we had corrected the retroversion, we were able to gain complete seating of the reamer.  The glenoid was then irrigated.  We seated the size small base plate, applied our central 6.5 transfixing screws with excellent fixation and compression.  We then placed the superior and inferior peripheral screws in the locking technique with again excellent fit and fixation.  We then confirmed proper release of the surrounding soft tissues with no bony impingement for later application of the Glenosphere.  Once the base plate was firmly seated, we then returned our attention to the proximal humerus where we performed hand reaming up to size 7 and then broached up to size 10 mimicking approximately 20 degrees of retroversion.  Once we were satisfied with the fixation of our stem, we then reamed the metaphyseal region with the posterior offset.  At this point, we then assembled our final implant the size 10 stem with the posterior offset to accept a 39 Glenosphere.  The final implant was assembled and impacted into the humerus with excellent fit and fixation.  At this point, then we applied the 39+ 4 Glenosphere to the base plate and impacted this and confirmed that it had stable fixation.  We then performed a series of trial reductions and ultimately felt that the +3 polyethylene provided the best soft tissue balance  with excellent shoulder motion and stability.  The final +3 polyethylene was impacted into position.  Final reduction was performed and again soft tissue balance and overall stability was much to our satisfaction.  At this point, we then irrigated the wound.  Hemostasis was obtained.  The subscapularis was then repaired back to the lesser tuberosity region using combination of FiberWires through an eyelet on our humeral stem as well as through bone tunnels in the tuberosity and excellent reapposition was achieved, and it did confirm there was proper elasticity and mobility of the subscapularis for this repair he easily achieved 30 degrees of external rotation with no excessive tension on the repair.  We then performed a biceps tenodesis at the margin of the pec major.  The wound was then again copiously irrigated.  Hemostasis was obtained.  The deltopectoral interval was then reapproximated with a series of figure-of-eight #1 Vicryl sutures, 2-0 Monocryl used for the subcu layer, intracuticular 3-0 Monocryl for the skin followed by Dermabond and an Aquacel dressing.  Right arm was placed in a sling. The patient was then awakened, extubated, and taken to recovery room in stable condition.  Jenetta Loges, P.A.-C., was used as an Environmental consultant  throughout this case essential for help with positioning the patient, positioning of the extremity, management of the retractors, tissue manipulation, implantation of prosthesis, wound closer, and intraoperative decision making.     Metta Clines. Bree Heinzelman, M.D.    KMS/MEDQ  D:  12/27/2014  T:  12/27/2014  Job:  [id number]

## 2014-12-27 NOTE — Transfer of Care (Signed)
Immediate Anesthesia Transfer of Care Note  Patient: Shaun Velasquez  Procedure(s) Performed: Procedure(s): RIGHT REVERSE SHOULDER ARTHROPLASTY (Right)  Patient Location: PACU  Anesthesia Type:GA combined with regional for post-op pain  Level of Consciousness: awake, alert , oriented and patient cooperative  Airway & Oxygen Therapy: Patient Spontanous Breathing and Patient connected to nasal cannula oxygen  Post-op Assessment: Report given to RN, Post -op Vital signs reviewed and stable and Patient moving all extremities  Post vital signs: Reviewed and stable  Last Vitals:  Filed Vitals:   12/27/14 0611 12/27/14 1120  BP: 138/91   Pulse: 71   Temp: 36.4 C 36.5 C  Resp: 20     Complications: No apparent anesthesia complications

## 2014-12-27 NOTE — H&P (Signed)
Shaun Velasquez    Chief Complaint: RIGHT SHOULDER ROTATOR CUFF TEAR ARTHROPATHY  HPI: The patient is a 54 y.o. male with end stage right shoulder rotator cuff tear arthropathy  Past Medical History  Diagnosis Date  . Collapsed lung   . PONV (postoperative nausea and vomiting)   . Anxiety   . Hypothyroidism   . GERD (gastroesophageal reflux disease)   . Arthritis   . Hyperlipemia   . Psoriasis     Past Surgical History  Procedure Laterality Date  . Incision and drainage perirectal abscess    . Shoulder arthroscopy with rotator cuff repair Left 2003  . Shoulder arthroscopy with subacromial decompression Left   . Shoulder arthroscopy w/ labral repair Right   . Rotator cuff repair Right 2011  . Elbow surgery Right 2009  . Chest tube insertion      pt had collapsed lung  . Colonoscopy w/ polypectomy      History reviewed. No pertinent family history.  Social History:  reports that he has quit smoking. He does not have any smokeless tobacco history on file. He reports that he does not drink alcohol or use illicit drugs.  Allergies: No Known Allergies  Medications Prior to Admission  Medication Sig Dispense Refill  . atorvastatin (LIPITOR) 20 MG tablet Take 20 mg by mouth daily.    [name] HYDROcodone-acetaminophen (NORCO/VICODIN) 5-325 MG tablet Take 1-2 tablets by mouth at bedtime as needed. For pain  0  . levothyroxine (SYNTHROID, LEVOTHROID) 200 MCG tablet Take 200 mcg by mouth daily before breakfast.     . Multiple Vitamin (MULTIVITAMIN WITH MINERALS) TABS tablet Take 1 tablet by mouth daily.    [name] omeprazole (PRILOSEC) 20 MG capsule Take 20 mg by mouth daily as needed (acid reflux).   5  . traZODone (DESYREL) 50 MG tablet Take by mouth at bedtime as needed. for sleep  5     Physical Exam: right shoulder with painful and restricted motion as noted at recent office visits  Vitals  Temp:  [97.6 F (36.4 C)] 97.6 F (36.4 C) (12/15 0611) Pulse Rate:  [71] 71 (12/15 0611) Resp:   [20] 20 (12/15 0611) BP: (138)/(91) 138/91 mmHg (12/15 0611) SpO2:  [97 %] 97 % (12/15 0611)  Assessment/Plan  Impression: RIGHT SHOULDER ROTATOR CUFF TEAR ARTHROPATHY   Plan of Action: Procedure(s): RIGHT REVERSE SHOULDER ARTHROPLASTY  Shaun Velasquez M Uday Jantz 12/27/2014, 7:24 AM Contact # [phone]

## 2014-12-27 NOTE — Op Note (Signed)
12/27/2014  11:21 AM  PATIENT:   Shaun Velasquez  54 y.o. male  PRE-OPERATIVE DIAGNOSIS:  RIGHT SHOULDER ROTATOR CUFF TEAR ARTHROPATHY   POST-OPERATIVE DIAGNOSIS:  same  PROCEDURE:  R reverse shoulder arthroplasty. Small Arthrex baseplate, 39+4 glenosphere, #10 stem, +3 poly  SURGEON:  Rajendra Spiller, Metta Clines. M.D.  ASSISTANTS: Shuford pac   ANESTHESIA:   GET + ISB  EBL: 200  SPECIMEN:  none  Drains: none   PATIENT DISPOSITION:  PACU - hemodynamically stable.    PLAN OF CARE: Admit for overnight observation  Dictation# [id number]   Contact # [phone]

## 2014-12-28 ENCOUNTER — Encounter (HOSPITAL_COMMUNITY): Payer: Self-pay | Admitting: Orthopedic Surgery

## 2014-12-28 DIAGNOSIS — M75101 Unspecified rotator cuff tear or rupture of right shoulder, not specified as traumatic: Secondary | ICD-10-CM | POA: Diagnosis not present

## 2014-12-28 MED ORDER — METHOCARBAMOL 500 MG PO TABS: 500.0000 mg | ORAL_TABLET | Freq: Three times a day (TID) | ORAL | Status: DC | PRN

## 2014-12-28 MED ORDER — TEMAZEPAM 30 MG PO CAPS: 30.0000 mg | ORAL_CAPSULE | Freq: Every evening | ORAL | Status: DC | PRN

## 2014-12-28 MED ORDER — OXYCODONE-ACETAMINOPHEN 5-325 MG PO TABS: 1.0000 | ORAL_TABLET | ORAL | Status: DC | PRN

## 2014-12-28 NOTE — Discharge Instructions (Signed)

## 2014-12-28 NOTE — Discharge Summary (Signed)
PATIENT ID:      Shaun Velasquez  MRN:     [id number] DOB/AGE:    1960-03-05 / 54 y.o.     DISCHARGE SUMMARY  ADMISSION DATE:    12/27/2014 DISCHARGE DATE:    ADMISSION DIAGNOSIS: RIGHT SHOULDER ROTATOR CUFF TEAR ARTHROPATHY  Past Medical History  Diagnosis Date  . Collapsed lung   . PONV (postoperative nausea and vomiting)   . Anxiety   . Hypothyroidism   . GERD (gastroesophageal reflux disease)   . Arthritis   . Hyperlipemia   . Psoriasis     DISCHARGE DIAGNOSIS:   Active Problems:   S/P shoulder replacement   PROCEDURE: Procedure(s): RIGHT REVERSE SHOULDER ARTHROPLASTY on 12/27/2014  CONSULTS:   none  HISTORY:  See H&P in chart.  HOSPITAL COURSE:  Shaun Velasquez is a 54 y.o. admitted on 12/27/2014 with a chief complaint of severe right shoulder pain failing outpatient conservative measures, and found to have a diagnosis of [place] .  They were brought to the operating room on 12/27/2014 and underwent Procedure(s): RIGHT REVERSE SHOULDER ARTHROPLASTY.    They were given perioperative antibiotics: Anti-infectives    Start     Dose/Rate Route Frequency Ordered Stop   12/27/14 1430  ceFAZolin (ANCEF) IVPB 2 g/50 mL premix     2 g 100 mL/hr over 30 Minutes Intravenous Every 6 hours 12/27/14 1406 12/28/14 0347   12/27/14 0559  ceFAZolin (ANCEF) 2-3 GM-% IVPB SOLR    Comments:  Ara Kussmaul   : cabinet override      12/27/14 0559 12/27/14 1814   12/27/14 0553  ceFAZolin (ANCEF) IVPB 2 g/50 mL premix     2 g 100 mL/hr over 30 Minutes Intravenous On call to O.R. 12/27/14 [id number] 12/27/14 0815    .  Patient underwent the above named procedure and tolerated it well. The following day they were hemodynamically stable and pain was controlled on oral analgesics. They were neurovascularly intact to the operative extremity. OT was ordered and worked with patient per protocol. They were medically and orthopaedically stable for discharge on day1.      DIAGNOSTIC STUDIES:  RECENT RADIOGRAPHIC STUDIES :  No results found.  RECENT VITAL SIGNS:  Patient Vitals for the past 24 hrs:  BP Temp Temp src Pulse Resp SpO2 Height Weight  12/28/14 0442 101/75 mmHg - - - - - - -  12/28/14 0437 95/61 mmHg 98.7 F (37.1 C) Oral 82 16 94 % - -  12/28/14 0108 107/63 mmHg 98.6 F (37 C) Oral 90 18 91 % - -  12/27/14 2007 122/76 mmHg 97.9 F (36.6 C) Oral 93 18 95 % - -  12/27/14 1532 - - - - - - 5\' 10"  (1.778 m) 105.416 kg (232 lb 6.4 oz)  12/27/14 1414 124/86 mmHg 97.5 F (36.4 C) Oral 85 16 94 % - -  12/27/14 1345 - 98.1 F (36.7 C) - 82 15 95 % - -  12/27/14 1330 113/81 mmHg - - 86 14 94 % - -  12/27/14 1315 - - - 80 18 95 % - -  12/27/14 1300 - - - 81 11 94 % - -  12/27/14 1245 (!) 119/91 mmHg - - 88 13 93 % - -  12/27/14 1230 (!) 115/91 mmHg - - 83 19 93 % - -  12/27/14 1215 113/88 mmHg - - 89 20 92 % - -  12/27/14 1200 124/90 mmHg - - 85 19  94 % - -  12/27/14 1145 (!) 128/99 mmHg - - 90 14 94 % - -  12/27/14 1130 124/83 mmHg - - 90 14 94 % - -  12/27/14 1120 (!) 141/87 mmHg 97.7 F (36.5 C) - - 17 96 % - -  .  RECENT EKG RESULTS:   No orders found for this or any previous visit.  DISCHARGE INSTRUCTIONS:  Discharge Instructions    Discontinue IV    Complete by:  As directed            DISCHARGE MEDICATIONS:     Medication List    STOP taking these medications        HYDROcodone-acetaminophen 5-325 MG tablet  Commonly known as:  NORCO/VICODIN      TAKE these medications        atorvastatin 20 MG tablet  Commonly known as:  LIPITOR  Take 20 mg by mouth daily.     levothyroxine 200 MCG tablet  Commonly known as:  SYNTHROID, LEVOTHROID  Take 200 mcg by mouth daily before breakfast.     methocarbamol 500 MG tablet  Commonly known as:  ROBAXIN  Take 1 tablet (500 mg total) by mouth every 8 (eight) hours as needed for muscle spasms.     multivitamin with minerals Tabs tablet  Take 1 tablet by mouth daily.      omeprazole 20 MG capsule  Commonly known as:  PRILOSEC  Take 20 mg by mouth daily as needed (acid reflux).     oxyCODONE-acetaminophen 5-325 MG tablet  Commonly known as:  PERCOCET  Take 1-2 tablets by mouth every 4 (four) hours as needed.     temazepam 30 MG capsule  Commonly known as:  RESTORIL  Take 1 capsule (30 mg total) by mouth at bedtime as needed for sleep.     traZODone 50 MG tablet  Commonly known as:  DESYREL  Take by mouth at bedtime as needed. for sleep        FOLLOW UP VISIT:       Follow-up Information    Follow up with Metta Clines SUPPLE, MD.   Specialty:  Orthopedic Surgery   Why:  call to be seen in 10-14 days   Contact information:   [address] [place] [address] [phone]       DISCHARGE TO: home  DISPOSITION: good  DISCHARGE CONDITION:  Festus Barren for Dr. Justice Britain 12/28/2014, 7:54 AM

## 2014-12-28 NOTE — Progress Notes (Signed)
Occupational Therapy Treatment Patient Details Name: Shaun Velasquez MRN: [id number] DOB: 26-Oct-1960 Today's Date: 12/28/2014    History of present illness RIGHT REVERSE SHOULDER ARTHROPLASTY   OT comments  Pt making great progress toward OT goals. Pt tolerated R UE ROM exercises; instructed in self ROM, pt was able to return demonstrate understanding. Pts wife able to assist with ADLs this session. Currently pt is min assist for UB dressing and supervision for LB dressing and toilet transfers. Wife able to demonstrate donning sling independently. Pt ready to d/c from OT standpoint but will continue to follow pt acutely.    Follow Up Recommendations  Other (comment);Supervision - Intermittent (follow up per MD)    Equipment Recommendations  None recommended by OT    Recommendations for Other Services      Precautions / Restrictions Precautions Precautions: Shoulder;Fall Type of Shoulder Precautions: Active protocol: AROM elbow, wrist, hand OK. AROM/PROM shoulder FF 90, ABD 60. NO IR/ER. Shoulder Interventions: Shoulder sling/immobilizer;At all times;Off for dressing/bathing/exercises Precaution Booklet Issued: Yes (comment) Precaution Comments: Reviewed precautions Required Braces or Orthoses: Sling Restrictions Weight Bearing Restrictions: Yes RUE Weight Bearing: Non weight bearing       Mobility Bed Mobility Overal bed mobility: Modified Independent                Transfers Overall transfer level: Needs assistance Equipment used: None Transfers: Sit to/from Stand Sit to Stand: Supervision         General transfer comment: Supervision for safety. Good hand placement and technique    Balance Overall balance assessment: No apparent balance deficits (not formally assessed)                                 ADL Overall ADL's : Needs assistance/impaired Eating/Feeding: Set up;Sitting   Grooming: Wash/dry hands;Supervision/safety;Standing   Upper  Body Bathing: Minimal assitance;Standing Upper Body Bathing Details (indicate cue type and reason): Pt able to teach back UB bathing technique. Lower Body Bathing: Supervison/ safety;Sit to/from stand   Upper Body Dressing : Minimal assistance;Standing Upper Body Dressing Details (indicate cue type and reason): Pt able to teach back and demonstrate UB dressing technique. Lower Body Dressing: Supervision/safety;Sit to/from stand   Toilet Transfer: Supervision/safety;Ambulation;Regular Toilet   Toileting- Water quality scientist and Hygiene: Supervision/safety       Functional mobility during ADLs: Supervision/safety General ADL Comments: Pts wife present for OT session. Pt able to teach back sling management and wear schedule, ice for edema and pain, precautions. Wife able to demostrate ability to assist with UB ADLs and sling management. Pt able to perform all RUE ROM exercises with supervision for safety.      Vision                     Perception     Praxis      Cognition   Behavior During Therapy: WFL for tasks assessed/performed Overall Cognitive Status: Within Functional Limits for tasks assessed                       Extremity/Trunk Assessment  Upper Extremity Assessment Upper Extremity Assessment: RUE deficits/detail RUE Deficits / Details: Elbow, wrist, hand AROM WFL. RUE: Unable to fully assess due to pain;Unable to fully assess due to immobilization   Lower Extremity Assessment Lower Extremity Assessment: Overall WFL for tasks assessed   Cervical / Trunk Assessment Cervical / Trunk Assessment: Normal  Exercises Shoulder Exercises Shoulder Flexion: Self ROM;Right;10 reps;Seated (0-90 degrees) Shoulder ABduction: Self ROM;Right;10 reps;Seated (0-60 degrees) Elbow Flexion: AROM;Right;10 reps;Standing (full ROM) Wrist Flexion: AROM;Right;10 reps;Seated (full ROM) Digit Composite Flexion: AROM;Right;10 reps;Seated (full ROM) Donning/doffing  shirt without moving shoulder: Minimal assistance;Caregiver independent with task;Patient able to independently direct caregiver Method for sponge bathing under operated UE: Minimal assistance;Caregiver independent with task;Patient able to independently direct caregiver Donning/doffing sling/immobilizer: Moderate assistance;Caregiver independent with task;Patient able to independently direct caregiver Correct positioning of sling/immobilizer: Minimal assistance;Caregiver independent with task;Patient able to independently direct caregiver ROM for elbow, wrist and digits of operated UE: Modified independent Sling wearing schedule (on at all times/off for ADL's): Modified independent Proper positioning of operated UE when showering: Supervision/safety;Caregiver independent with task;Patient able to independently direct caregiver Positioning of UE while sleeping: Minimal assistance;Caregiver independent with task;Patient able to independently direct caregiver   Shoulder Instructions Shoulder Instructions Donning/doffing shirt without moving shoulder: Minimal assistance;Caregiver independent with task;Patient able to independently direct caregiver Method for sponge bathing under operated UE: Minimal assistance;Caregiver independent with task;Patient able to independently direct caregiver Donning/doffing sling/immobilizer: Moderate assistance;Caregiver independent with task;Patient able to independently direct caregiver Correct positioning of sling/immobilizer: Minimal assistance;Caregiver independent with task;Patient able to independently direct caregiver ROM for elbow, wrist and digits of operated UE: Modified independent Sling wearing schedule (on at all times/off for ADL's): Modified independent Proper positioning of operated UE when showering: Supervision/safety;Caregiver independent with task;Patient able to independently direct caregiver Positioning of UE while sleeping: Minimal  assistance;Caregiver independent with task;Patient able to independently direct caregiver     General Comments      Pertinent Vitals/ Pain       Pain Assessment: Faces Pain Score: 5  Faces Pain Scale: Hurts a little bit Pain Location: R shoulder Pain Descriptors / Indicators: Aching;Sore Pain Intervention(s): Limited activity within patient's tolerance;Monitored during session;Repositioned;Ice applied  Home Living Family/patient expects to be discharged to:: Private residence Living Arrangements: Spouse/significant other;Children Available Help at Discharge: Family;Available 24 hours/day Type of Home: House       Home Layout: Two level;Bed/bath upstairs Alternate Level Stairs-Number of Steps: flight Alternate Level Stairs-Rails: Right Bathroom Shower/Tub: Occupational psychologist: Standard     Home Equipment: None          Prior Functioning/Environment Level of Independence: Independent            Frequency Min 2X/week     Progress Toward Goals  OT Goals(current goals can now be found in the care plan section)  Progress towards OT goals: Progressing toward goals  Acute Rehab OT Goals Patient Stated Goal: return to independence OT Goal Formulation: With patient Time For Goal Achievement: 01/11/15 Potential to Achieve Goals: Good ADL Goals Pt Will Perform Upper Body Bathing: with supervision;standing Pt Will Perform Upper Body Dressing: with supervision;sitting Pt/caregiver will Perform Home Exercise Program: Increased ROM;Right Upper extremity;Independently;With written HEP provided  Plan Discharge plan remains appropriate    Co-evaluation                 End of Session Equipment Utilized During Treatment: Other (comment) (sling)   Activity Tolerance Patient tolerated treatment well   Patient Left in bed;with call bell/phone within reach;with family/visitor present   Nurse Communication Other (comment) (Pt ready for d/c from OT  standpoint)        Time: 1114-1140 OT Time Calculation (min): 26 min  Charges: OT General Charges $OT Visit: 1 Procedure OT Evaluation $Initial OT Evaluation Tier I: 1 Procedure OT  Treatments $Self Care/Home Management : 8-22 mins $Therapeutic Exercise: 8-22 mins  Binnie Kand M.S., OTR/L Pager: [phone]  12/28/2014, 11:50 AM

## 2014-12-28 NOTE — Evaluation (Signed)
Occupational Therapy Evaluation Patient Details Name: Shaun Velasquez MRN: [id number] DOB: 27-Jun-1960 Today's Date: 12/28/2014    History of Present Illness RIGHT REVERSE SHOULDER ARTHROPLASTY   Clinical Impression   Pt reports he was independent with ADLs PTA. Currently pt is overall supervision for ADLs and functional mobility with the exception of min assist for UB ADLs. Began shoulder and safety education with pt; he verbalized understanding. Pt planning to d/c home with 24/7 supervision from family. Pt would benefit from continued skilled OT services in order to maximize independence and safety with UB ADLs and HEP.     Follow Up Recommendations  Other (comment);Supervision - Intermittent (follow up per MD)    Equipment Recommendations  None recommended by OT    Recommendations for Other Services       Precautions / Restrictions Precautions Precautions: Shoulder;Fall Type of Shoulder Precautions: Active protocol: AROM elbow, wrist, hand OK. AROM/PROM shoulder FF 90, ABD 60. NO IR/ER. Shoulder Interventions: Shoulder sling/immobilizer;At all times;Off for dressing/bathing/exercises Precaution Booklet Issued: Yes (comment) Precaution Comments: Reviewed precautions Required Braces or Orthoses: Sling Restrictions Weight Bearing Restrictions: Yes RUE Weight Bearing: Non weight bearing      Mobility Bed Mobility Overal bed mobility: Modified Independent                Transfers Overall transfer level: Needs assistance Equipment used: None Transfers: Sit to/from Stand Sit to Stand: Supervision         General transfer comment: Supervision for safety. Good hand placement. Sit to stand from EOB x 1.    Balance Overall balance assessment: No apparent balance deficits (not formally assessed)                                          ADL Overall ADL's : Needs assistance/impaired Eating/Feeding: Set up;Sitting   Grooming:  Supervision/safety;Standing   Upper Body Bathing: Minimal assitance;Standing   Lower Body Bathing: Supervison/ safety;Sit to/from stand   Upper Body Dressing : Minimal assistance;Standing;Sitting   Lower Body Dressing: Supervision/safety;Sit to/from stand   Toilet Transfer: Supervision/safety;Ambulation;Regular Toilet   Toileting- Water quality scientist and Hygiene: Supervision/safety;Sit to/from stand       Functional mobility during ADLs: Supervision/safety General ADL Comments: No family present for OT eval. Educated on sling management and wear schedule, positioning of RUE in bed, ice for edema and pain, UB bathing and dressing techniques, precautions, RUE exercises, need for supervision for safety with ADLs and mobility; pt verbalized understanding.     Vision     Perception     Praxis      Pertinent Vitals/Pain Pain Assessment: 0-10 Pain Score: 5  Pain Location: R shoulder Pain Descriptors / Indicators: Aching;Sore Pain Intervention(s): Limited activity within patient's tolerance;Monitored during session;Repositioned;Ice applied;Patient requesting pain meds-RN notified;RN gave pain meds during session     Hand Dominance Right   Extremity/Trunk Assessment Upper Extremity Assessment Upper Extremity Assessment: RUE deficits/detail RUE Deficits / Details: Elbow, wrist, hand AROM WFL. RUE: Unable to fully assess due to pain;Unable to fully assess due to immobilization   Lower Extremity Assessment Lower Extremity Assessment: Overall WFL for tasks assessed   Cervical / Trunk Assessment Cervical / Trunk Assessment: Normal   Communication Communication Communication: No difficulties   Cognition Arousal/Alertness: Awake/alert Behavior During Therapy: WFL for tasks assessed/performed Overall Cognitive Status: Within Functional Limits for tasks assessed  General Comments       Exercises Exercises: Shoulder     Shoulder Instructions  Shoulder Instructions Donning/doffing shirt without moving shoulder: Minimal assistance (education completed) Method for sponge bathing under operated UE: Minimal assistance (education completed) Donning/doffing sling/immobilizer: Minimal assistance (education completed) Correct positioning of sling/immobilizer: Minimal assistance (education completed) Sling wearing schedule (on at all times/off for ADL's): Modified independent (education completed) Proper positioning of operated UE when showering: Supervision/safety (education completed) Positioning of UE while sleeping: Minimal assistance (education completed)    Home Living Family/patient expects to be discharged to:: Private residence Living Arrangements: Spouse/significant other;Children Available Help at Discharge: Family;Available 24 hours/day Type of Home: House       Home Layout: Two level;Bed/bath upstairs Alternate Level Stairs-Number of Steps: flight Alternate Level Stairs-Rails: Right Bathroom Shower/Tub: Occupational psychologist: Standard     Home Equipment: None          Prior Functioning/Environment Level of Independence: Independent             OT Diagnosis: Acute pain   OT Problem List: Decreased range of motion;Decreased knowledge of precautions;Pain;Impaired UE functional use   OT Treatment/Interventions: Self-care/ADL training;Therapeutic exercise;Patient/family education    OT Goals(Current goals can be found in the care plan section) Acute Rehab OT Goals Patient Stated Goal: return to independence OT Goal Formulation: With patient Time For Goal Achievement: 01/11/15 Potential to Achieve Goals: Good ADL Goals Pt Will Perform Upper Body Bathing: with supervision;standing Pt Will Perform Upper Body Dressing: with supervision;sitting Pt/caregiver will Perform Home Exercise Program: Increased ROM;Right Upper extremity;Independently;With written HEP provided  OT Frequency: Min 2X/week    Barriers to D/C:            Co-evaluation              End of Session Equipment Utilized During Treatment: Other (comment) (sling) Nurse Communication: Mobility status;Other (comment);Patient requests pain meds (pt needs to be seen by OT one more time before d/c)  Activity Tolerance: Patient tolerated treatment well Patient left: in chair;with call bell/phone within reach   Time: [id number] OT Time Calculation (min): 18 min Charges:  OT General Charges $OT Visit: 1 Procedure OT Evaluation $Initial OT Evaluation Tier I: 1 Procedure G-Codes:     Binnie Kand M.S., OTR/L [phone]  12/28/2014, 10:04 AM

## 2014-12-31 NOTE — Progress Notes (Signed)
Late entry due to missed g-code     2015-01-14 1300  OT G-codes **NOT FOR INPATIENT CLASS**  Functional Assessment Tool Used Clinical judgement  Functional Limitation Self care  Self Care Current Status [phone]) CI  Self Care Goal Status [id number]) CI  Self Care Discharge Status [id number]) CI   Truman Hayward M.S., OTR/L Pager: [phone]

## 2014-12-31 NOTE — Addendum Note (Signed)
Addendum  created 12/31/14 1651 by Lillia Abed, MD   Modules edited: Anesthesia Blocks and Procedures, Clinical Notes   Clinical Notes:  File: [id number]

## 2015-01-09 ENCOUNTER — Encounter (HOSPITAL_COMMUNITY): Payer: Self-pay | Admitting: Orthopedic Surgery

## 2015-05-19 DIAGNOSIS — IMO0001 Reserved for inherently not codable concepts without codable children: Secondary | ICD-10-CM

## 2015-05-19 DIAGNOSIS — K219 Gastro-esophageal reflux disease without esophagitis: Secondary | ICD-10-CM | POA: Insufficient documentation

## 2015-05-19 DIAGNOSIS — E119 Type 2 diabetes mellitus without complications: Secondary | ICD-10-CM | POA: Insufficient documentation

## 2015-05-19 DIAGNOSIS — E039 Hypothyroidism, unspecified: Secondary | ICD-10-CM | POA: Insufficient documentation

## 2015-05-19 DIAGNOSIS — F419 Anxiety disorder, unspecified: Secondary | ICD-10-CM | POA: Insufficient documentation

## 2015-05-19 DIAGNOSIS — E785 Hyperlipidemia, unspecified: Secondary | ICD-10-CM | POA: Insufficient documentation

## 2015-05-19 HISTORY — DX: Reserved for inherently not codable concepts without codable children: IMO0001

## 2015-05-28 LAB — HEPATIC FUNCTION PANEL
ALT: 23 U/L (ref 10–40)
AST: 13 U/L — AB (ref 14–40)
Alkaline Phosphatase: 95 U/L (ref 25–125)
Bilirubin, Total: 0.6 mg/dL

## 2015-05-28 LAB — LIPID PANEL
Cholesterol: 207 mg/dL — AB (ref 0–200)
HDL: 44 mg/dL (ref 35–70)
LDL Cholesterol: 155 mg/dL
Triglycerides: 84 mg/dL (ref 40–160)

## 2015-05-28 LAB — BASIC METABOLIC PANEL
BUN: 16 mg/dL (ref 4–21)
Creatinine: 0.9 mg/dL (ref <=1.3)
Glucose: 142 mg/dL
Potassium: 4.4 mmol/L (ref 3.4–5.3)
Sodium: 137 mmol/L (ref 137–147)

## 2015-05-28 LAB — HEMOGLOBIN A1C: Hemoglobin A1C: 7.2

## 2015-05-28 LAB — TSH: TSH: 1.49 u[IU]/mL (ref <=5.90)

## 2015-05-30 DIAGNOSIS — E663 Overweight: Secondary | ICD-10-CM | POA: Insufficient documentation

## 2015-05-30 DIAGNOSIS — E6609 Other obesity due to excess calories: Secondary | ICD-10-CM | POA: Insufficient documentation

## 2015-07-31 LAB — CBC AND DIFFERENTIAL
HCT: 40 % — AB (ref 41–53)
Hemoglobin: 13.7 g/dL (ref 13.5–17.5)
Neutrophils Absolute: 5 /uL
WBC: 7.9 10^3/mL

## 2015-08-27 ENCOUNTER — Encounter: Payer: Self-pay | Admitting: Allergy and Immunology

## 2015-08-27 ENCOUNTER — Ambulatory Visit (INDEPENDENT_AMBULATORY_CARE_PROVIDER_SITE_OTHER): Payer: BLUE CROSS/BLUE SHIELD | Admitting: Allergy and Immunology

## 2015-08-27 VITALS — BP 126/84 | HR 78 | Temp 98.1°F | Resp 16 | Ht 68.5 in | Wt 213.6 lb

## 2015-08-27 DIAGNOSIS — R05 Cough: Secondary | ICD-10-CM | POA: Diagnosis not present

## 2015-08-27 DIAGNOSIS — R053 Chronic cough: Secondary | ICD-10-CM | POA: Insufficient documentation

## 2015-08-27 DIAGNOSIS — J3089 Other allergic rhinitis: Secondary | ICD-10-CM | POA: Diagnosis not present

## 2015-08-27 DIAGNOSIS — K219 Gastro-esophageal reflux disease without esophagitis: Secondary | ICD-10-CM | POA: Insufficient documentation

## 2015-08-27 DIAGNOSIS — J4530 Mild persistent asthma, uncomplicated: Secondary | ICD-10-CM | POA: Diagnosis not present

## 2015-08-27 MED ORDER — ALBUTEROL SULFATE 108 (90 BASE) MCG/ACT IN AEPB: 2.0000 | INHALATION_SPRAY | Freq: Four times a day (QID) | RESPIRATORY_TRACT | 3 refills | Status: DC | PRN

## 2015-08-27 MED ORDER — BECLOMETHASONE DIPROPIONATE 40 MCG/ACT IN AERS: 2.0000 | INHALATION_SPRAY | Freq: Two times a day (BID) | RESPIRATORY_TRACT | 2 refills | Status: DC

## 2015-08-27 MED ORDER — MONTELUKAST SODIUM 10 MG PO TABS: 10.0000 mg | ORAL_TABLET | Freq: Every day | ORAL | 5 refills | Status: DC

## 2015-08-27 MED ORDER — MOMETASONE FUROATE 50 MCG/ACT NA SUSP
1.0000 | Freq: Every day | NASAL | 5 refills | Status: DC
Start: 2015-08-27 — End: 2016-03-27

## 2015-08-27 NOTE — Assessment & Plan Note (Signed)
   Appropriate reflux lifestyle modifications have been discussed and provided in written form.  A prescription has been provided for Dexilant 30 mg daily, 30 minutes prior to breakfast.

## 2015-08-27 NOTE — Assessment & Plan Note (Addendum)
   Aeroallergen avoidance measures have been discussed and provided in written form.  A prescription has been provided for Nasonex nasal spray, one spray per nostril 1-2 times daily as needed. Proper nasal spray technique has been discussed and demonstrated.   Nasal saline lavage (NeilMed) as needed has been recommended along with instructions for proper administration.  For thick postnasal drainage, add Mucinex maximum strength (guaifenesin 1200 mg) twice daily as needed with adequate hydration as discussed.   If allergen avoidance measures and medications fail to adequately relieve symptoms, aeroallergen immunotherapy will be considered.

## 2015-08-27 NOTE — Assessment & Plan Note (Signed)
Today's spirometry results, assessed while asymptomatic, suggest under-perception of bronchoconstriction.  A prescription has been provided for Qvar (beclomethasone) 40 g, 2 inhalations twice a day.  To maximize pulmonary deposition, a spacer has been provided along with instructions for its proper administration with an HFA inhaler.  A prescription has been provided for montelukast 10 mg daily at bedtime.  A prescription has been provided for ProAir Respiclick, 1-2 inhalations every 4-6 hours as needed.

## 2015-08-27 NOTE — Progress Notes (Signed)
New Patient Note  RE: Shaun Velasquez MRN: [id number] DOB: 12/02/1960 Date of Office Visit: 08/27/2015  Referring provider: Lauraine Rinne, MD Primary care provider: [place]  Chief Complaint: Cough and Allergic Rhinitis    History of present illness: Shaun Velasquez is a 55 y.o. male presenting today for consultation of coughing and rhinitis.  He reports that over the past 6 years he has experienced a persistent/recurrent cough.   The onset of symptoms occurred not long after having moved from [place] to [place].  The cough   Has no significant diurnal pattern and tends to be increased with prolonged talking and possibly after meals.  In addition, the cough is exacerbated by postnasal drainage as well.  The cough becomes severe and lingers with increased frequency and severity during/after upper respiratory tract infections.  Often times, the cough originates as a "tickle" at the base of the throat.  [name] experiences occasional nasal congestion, sinus pressure cheekbones, and as mentioned previously thick postnasal drainage.  He denies dyspnea or wheezing.  The cough, as well as his nasal symptoms, occur year around but seem to occur more frequently in the springtime and in the fall.  He has been treated in the past with proton pump inhibitors, cough suppressants, and antibiotics without adequate symptom relief.     Assessment and plan: Persistent cough The most common causes of chronic cough include the following: upper airway cough syndrome (UACS) which is caused by variety of rhinosinus conditions; asthma; gastroesophageal reflux disease (GERD); chronic bronchitis from cigarette smoking or other inhaled environmental irritants; non-asthmatic eosinophilic bronchitis; and bronchiectasis. In prospective studies, these conditions have accounted for up to 94% of the causes of chronic cough in immunocompetent adults. The history and physical examination  suggest that his cough is multifactorial with contribution from bronchial hyper-responsiveness and viscous postnasal drainage. We will address these issues at this time.   Plan as outlined below.    We will regroup in 6 weeks to assess treatment response and adjust therapy accordingly.  If symptoms persist or progress, we will readdress the possibility of contribution from laryngopharyngeal reflux.  Perennial and seasonal allergic rhinitis  Aeroallergen avoidance measures have been discussed and provided in written form.  A prescription has been provided for Nasonex nasal spray, one spray per nostril 1-2 times daily as needed. Proper nasal spray technique has been discussed and demonstrated.   Nasal saline lavage (NeilMed) as needed has been recommended along with instructions for proper administration.  For thick postnasal drainage, add Mucinex maximum strength (guaifenesin 1200 mg) twice daily as needed with adequate hydration as discussed.   If allergen avoidance measures and medications fail to adequately relieve symptoms, aeroallergen immunotherapy will be considered.  Cough variant asthma Today's spirometry results, assessed while asymptomatic, suggest under-perception of bronchoconstriction.  A prescription has been provided for Qvar (beclomethasone) 40 g, 2 inhalations twice a day.  To maximize pulmonary deposition, a spacer has been provided along with instructions for its proper administration with an HFA inhaler.  A prescription has been provided for montelukast 10 mg daily at bedtime.  A prescription has been provided for ProAir Respiclick, 1-2 inhalations every 4-6 hours as needed.   Meds ordered this encounter  Medications  . beclomethasone (QVAR) 40 MCG/ACT inhaler    Sig: Inhale 2 puffs into the lungs 2 (two) times daily. With spacer    Dispense:  8.7 g    Refill:  2    Place on hold  until patient needs.  . montelukast (SINGULAIR) 10 MG tablet    Sig: Take 1  tablet (10 mg total) by mouth at bedtime.    Dispense:  30 tablet    Refill:  5  . mometasone (NASONEX) 50 MCG/ACT nasal spray    Sig: Place 1-2 sprays into the nose daily.    Dispense:  17 g    Refill:  5  . Albuterol Sulfate (PROAIR RESPICLICK) [id number] (90 Base) MCG/ACT AEPB    Sig: Inhale 2 puffs into the lungs every 6 (six) hours as needed.    Dispense:  1 each    Refill:  3    Diagnositics: Spirometry:  FVC was 3.76 L and FEV1 was 2.94 L with an FEV1 ratio of 78%.  There was significant (1 L, 25%) postbronchodilator improvement. Epicutaneous testing:   Positive to grass pollens, ragweed pollen, weed pollens, and tree pollens. Intradermal testing:   Positive to molds, cat hair, and dog epithelia.    Physical examination: Blood pressure 126/84, pulse 78, temperature 98.1 F (36.7 C), temperature source Oral, resp. rate 16, height 5' 8.5" (1.74 m), weight 213 lb 9.6 oz (96.9 kg).  General: Alert, interactive, in no acute distress. HEENT: TMs pearly gray, turbinates moderately edematous without discharge, post-pharynx moderately erythematous. Neck: Supple without lymphadenopathy. Lungs: Clear to auscultation without wheezing, rhonchi or rales. CV: Normal S1, S2 without murmurs. Abdomen: Nondistended, nontender. Skin: Warm and dry, without lesions or rashes. Extremities:  No clubbing, cyanosis or edema. Neuro:   Grossly intact.  Review of systems:  Review of systems negative except as noted in HPI / PMHx or noted below: Review of Systems  Constitutional: Negative.   HENT: Negative.   Eyes: Negative.   Respiratory: Negative.   Cardiovascular: Negative.   Gastrointestinal: Negative.   Genitourinary: Negative.   Musculoskeletal: Negative.   Skin: Negative.   Neurological: Negative.   Endo/Heme/Allergies: Negative.   Psychiatric/Behavioral: Negative.     Past medical history:  Past Medical History:  Diagnosis Date  . Anxiety   . Arthritis   . Collapsed lung   . Eczema    . GERD (gastroesophageal reflux disease)   . Hyperlipemia   . Hypothyroidism   . PONV (postoperative nausea and vomiting)   . Psoriasis     Past surgical history:  Past Surgical History:  Procedure Laterality Date  . CHEST TUBE INSERTION     pt had collapsed lung  . COLONOSCOPY W/ POLYPECTOMY    . ELBOW SURGERY Right 2009  . INCISION AND DRAINAGE PERIRECTAL ABSCESS    . REVERSE SHOULDER ARTHROPLASTY Right 12/27/2014   Procedure: RIGHT REVERSE SHOULDER ARTHROPLASTY;  Surgeon: Justice Britain, MD;  Location: [facility];  Service: Orthopedics;  Laterality: Right;  . ROTATOR CUFF REPAIR Right 2011  . SHOULDER ARTHROSCOPY W/ LABRAL REPAIR Right   . SHOULDER ARTHROSCOPY WITH ROTATOR CUFF REPAIR Left 2003  . SHOULDER ARTHROSCOPY WITH SUBACROMIAL DECOMPRESSION Left     Family history: Family History  Problem Relation Age of Onset  . Allergic rhinitis Mother   . Lung cancer Mother   . Asthma Sister     Social history: Social History   Social History  . Marital status: Married    Spouse name: N/A  . Number of children: N/A  . Years of education: N/A   Occupational History  . Not on file.   Social History Main Topics  . Smoking status: Former Research scientist (life sciences)  . Smokeless tobacco: Former Systems developer  . Alcohol use No  .  Drug use: No  . Sexual activity: Not on file   Other Topics Concern  . Not on file   Social History Narrative  . No narrative on file   Environmental History: The patient lives in a 56 year old house with hardwood floors throughout and central air/heat.  There are 2 dogs and 3 cats in the house which have access to his bedroom.  He is a nonsmoker.    Medication List       Accurate as of 08/27/15 12:23 PM. Always use your most recent med list.          Albuterol Sulfate 108 (90 Base) MCG/ACT Aepb Commonly known as:  PROAIR RESPICLICK Inhale 2 puffs into the lungs every 6 (six) hours as needed.   atorvastatin 20 MG tablet Commonly known as:  LIPITOR Take 20 mg by  mouth daily.   beclomethasone 40 MCG/ACT inhaler Commonly known as:  QVAR Inhale 2 puffs into the lungs 2 (two) times daily. With spacer   hydrOXYzine 25 MG tablet Commonly known as:  ATARAX/VISTARIL Take 25 mg by mouth 3 (three) times daily as needed.   levothyroxine 200 MCG tablet Commonly known as:  SYNTHROID, LEVOTHROID Take 200 mcg by mouth daily before breakfast.   metFORMIN 500 MG tablet Commonly known as:  GLUCOPHAGE Take 1,000 mg by mouth 2 (two) times daily with a meal.   methocarbamol 500 MG tablet Commonly known as:  ROBAXIN Take 1 tablet (500 mg total) by mouth every 8 (eight) hours as needed for muscle spasms.   mometasone 50 MCG/ACT nasal spray Commonly known as:  NASONEX Place 1-2 sprays into the nose daily.   montelukast 10 MG tablet Commonly known as:  SINGULAIR Take 1 tablet (10 mg total) by mouth at bedtime.   multivitamin with minerals Tabs tablet Take 1 tablet by mouth daily.   omeprazole 20 MG capsule Commonly known as:  PRILOSEC Take 20 mg by mouth daily as needed (acid reflux).   oxyCODONE-acetaminophen 5-325 MG tablet Commonly known as:  PERCOCET Take 1-2 tablets by mouth every 4 (four) hours as needed.   rosuvastatin 40 MG tablet Commonly known as:  CRESTOR Take 40 mg by mouth daily.   temazepam 30 MG capsule Commonly known as:  RESTORIL Take 1 capsule (30 mg total) by mouth at bedtime as needed for sleep.       Known medication allergies: No Known Allergies  I appreciate the opportunity to take part in Shaun Velasquez's care. Please do not hesitate to contact me with questions.  Sincerely,   R. Edgar Frisk, MD

## 2015-08-27 NOTE — Patient Instructions (Addendum)
Persistent cough The most common causes of chronic cough include the following: upper airway cough syndrome (UACS) which is caused by variety of rhinosinus conditions; asthma; gastroesophageal reflux disease (GERD); chronic bronchitis from cigarette smoking or other inhaled environmental irritants; non-asthmatic eosinophilic bronchitis; and bronchiectasis. In prospective studies, these conditions have accounted for up to 94% of the causes of chronic cough in immunocompetent adults. The history and physical examination suggest that his cough is multifactorial with contribution from bronchial hyper-responsiveness and viscous postnasal drainage. We will address these issues at this time.   Plan as outlined below.    We will regroup in 6 weeks to assess treatment response and adjust therapy accordingly.  If symptoms persist or progress, we will readdress the possibility of contribution from laryngopharyngeal reflux.  Other allergic rhinitis  Aeroallergen avoidance measures have been discussed and provided in written form.  A prescription has been provided for Nasonex nasal spray, one spray per nostril 1-2 times daily as needed. Proper nasal spray technique has been discussed and demonstrated.   Nasal saline lavage (NeilMed) as needed has been recommended along with instructions for proper administration.  For thick postnasal drainage, add Mucinex maximum strength (guaifenesin 1200 mg) twice daily as needed with adequate hydration as discussed.   If allergen avoidance measures and medications fail to adequately relieve symptoms, aeroallergen immunotherapy will be considered.  Cough variant asthma Today's spirometry results, assessed while asymptomatic, suggest under-perception of bronchoconstriction.  A prescription has been provided for Qvar (beclomethasone) 40 g, 2 inhalations twice a day.  To maximize pulmonary deposition, a spacer has been provided along with instructions for its proper  administration with an HFA inhaler.  A prescription has been provided for montelukast 10 mg daily at bedtime.  A prescription has been provided for ProAir Respiclick, 1-2 inhalations every 4-6 hours as needed.   Return in about 6 weeks (around 10/08/2015), or if symptoms worsen or fail to improve.     Reducing Pollen Exposure  The American Academy of Allergy, Asthma and Immunology suggests the following steps to reduce your exposure to pollen during allergy seasons.    1. Do not hang sheets or clothing out to dry; pollen may collect on these items. 2. Do not mow lawns or spend time around freshly cut grass; mowing stirs up pollen. 3. Keep windows closed at night.  Keep car windows closed while driving. 4. Minimize morning activities outdoors, a time when pollen counts are usually at their highest. 5. Stay indoors as much as possible when pollen counts or humidity is high and on windy days when pollen tends to remain in the air longer. 6. Use air conditioning when possible.  Many air conditioners have filters that trap the pollen spores. 7. Use a HEPA room air filter to remove pollen form the indoor air you breathe.  Control of Mold Allergen  Mold and fungi can grow on a variety of surfaces provided certain temperature and moisture conditions exist.  Outdoor molds grow on plants, decaying vegetation and soil.  The major outdoor mold, Alternaria and Cladosporium, are found in very high numbers during hot and dry conditions.  Generally, a late Summer - Fall peak is seen for common outdoor fungal spores.  Rain will temporarily lower outdoor mold spore count, but counts rise rapidly when the rainy period ends.  The most important indoor molds are Aspergillus and Penicillium.  Dark, humid and poorly ventilated basements are ideal sites for mold growth.  The next most common sites of mold growth are  the bathroom and the kitchen.  Outdoor [employer] 2. Use air conditioning and keep windows  closed 3. Avoid exposure to decaying vegetation. 4. Avoid leaf raking. 5. Avoid grain handling. 6. Consider wearing a face mask if working in moldy areas.  Indoor Mold Control 1. Maintain humidity below 50%. 2. Clean washable surfaces with 5% bleach solution. 3. Remove sources e.g. Contaminated carpets.  Control of Dog or Cat Allergen  Avoidance is the best way to manage a dog or cat allergy. If you have a dog or cat and are allergic to dog or cats, consider removing the dog or cat from the home. If you have a dog or cat but don't want to find it a new home, or if your family wants a pet even though someone in the household is allergic, here are some strategies that may help keep symptoms at bay:  1. Keep the pet out of your bedroom and restrict it to only a few rooms. Be advised that keeping the dog or cat in only one room will not limit the allergens to that room. 2. Don't pet, hug or kiss the dog or cat; if you do, wash your hands with soap and water. 3. High-efficiency particulate air (HEPA) cleaners run continuously in a bedroom or living room can reduce allergen levels over time. 4. Regular use of a high-efficiency vacuum cleaner or a central vacuum can reduce allergen levels. 5. Giving your dog or cat a bath at least once a week can reduce airborne allergen.

## 2015-08-27 NOTE — Assessment & Plan Note (Addendum)
The most common causes of chronic cough include the following: upper airway cough syndrome (UACS) which is caused by variety of rhinosinus conditions; asthma; gastroesophageal reflux disease (GERD); chronic bronchitis from cigarette smoking or other inhaled environmental irritants; non-asthmatic eosinophilic bronchitis; and bronchiectasis. In prospective studies, these conditions have accounted for up to 94% of the causes of chronic cough in immunocompetent adults. The history and physical examination suggest that his cough is multifactorial with contribution from bronchial hyper-responsiveness and viscous postnasal drainage. We will address these issues at this time.   Plan as outlined below.    We will regroup in 6 weeks to assess treatment response and adjust therapy accordingly.  If symptoms persist or progress, we will readdress the possibility of contribution from laryngopharyngeal reflux.

## 2015-08-28 MED ORDER — MOMETASONE FUROATE 50 MCG/ACT NA SUSP: 2.0000 | Freq: Every day | NASAL | 5 refills | Status: DC

## 2015-08-28 NOTE — Addendum Note (Signed)
Addended by: Angelica Ran on: 08/28/2015 03:59 PM   Modules accepted: Orders

## 2015-08-29 ENCOUNTER — Telehealth: Payer: Self-pay | Admitting: *Deleted

## 2015-08-29 ENCOUNTER — Other Ambulatory Visit: Payer: Self-pay

## 2015-08-29 MED ORDER — FLUTICASONE PROPIONATE 50 MCG/ACT NA SUSP: 2.0000 | Freq: Every day | NASAL | 5 refills | Status: DC

## 2015-08-29 NOTE — Telephone Encounter (Signed)
Insurance will no cover generic Nasonex. Can we switch to Fluticasone. Please advise.

## 2015-08-29 NOTE — Telephone Encounter (Signed)
Sent in fluticasone ns and lm for pt to call us back about the change.

## 2015-08-29 NOTE — Telephone Encounter (Signed)
Yes, switch to fluticasone nasal spray.  Thanks.

## 2015-09-03 NOTE — Telephone Encounter (Signed)
Spoke with pt and informed him on change to meds

## 2015-09-17 ENCOUNTER — Other Ambulatory Visit (HOSPITAL_COMMUNITY): Payer: Self-pay | Admitting: Orthopedic Surgery

## 2015-09-17 ENCOUNTER — Other Ambulatory Visit: Payer: Self-pay | Admitting: Orthopedic Surgery

## 2015-09-17 DIAGNOSIS — M25511 Pain in right shoulder: Secondary | ICD-10-CM

## 2015-09-27 ENCOUNTER — Ambulatory Visit (HOSPITAL_COMMUNITY)
Admission: RE | Admit: 2015-09-27 | Discharge: 2015-09-27 | Disposition: A | Discharge status: Home / Self Care | Payer: BLUE CROSS/BLUE SHIELD | Source: Ambulatory Visit | Attending: Orthopedic Surgery | Admitting: Orthopedic Surgery

## 2015-09-27 DIAGNOSIS — M25511 Pain in right shoulder: Secondary | ICD-10-CM | POA: Diagnosis not present

## 2015-09-27 MED ORDER — TECHNETIUM TC 99M MEDRONATE IV KIT
21.0000 | PACK | Freq: Once | INTRAVENOUS | Status: AC | PRN
  Administered 2015-09-27: 21 via INTRAVENOUS

## 2015-10-14 ENCOUNTER — Encounter: Payer: Self-pay | Admitting: Allergy and Immunology

## 2015-10-14 ENCOUNTER — Ambulatory Visit (INDEPENDENT_AMBULATORY_CARE_PROVIDER_SITE_OTHER): Payer: BLUE CROSS/BLUE SHIELD | Admitting: Allergy and Immunology

## 2015-10-14 VITALS — BP 128/78 | HR 80 | Temp 98.1°F | Resp 16

## 2015-10-14 DIAGNOSIS — J4530 Mild persistent asthma, uncomplicated: Secondary | ICD-10-CM

## 2015-10-14 DIAGNOSIS — J3089 Other allergic rhinitis: Secondary | ICD-10-CM

## 2015-10-14 NOTE — Assessment & Plan Note (Signed)
   Continue appropriate allergen avoidance measures, Nasonex as needed, and nasal saline irrigation as needed.  The risks and benefits of aeroallergen immunotherapy have been discussed. The patient is motivated to initiate immunotherapy if insurance coverage is favorable. He will let us know how he would like to proceed.

## 2015-10-14 NOTE — Progress Notes (Addendum)
Follow-up Note  RE: Shaun Velasquez MRN: [id number] DOB: 1960-06-29 Date of Office Visit: 10/14/2015  Primary care provider: [place] Referring provider: Josefa Half*  History of present illness: Shaun Velasquez is a 55 y.o. male with cough variant asthma and allergic rhinitis presenting today for follow up.  He was most recently seen in this clinic on 08/27/2015 for his initial evaluation.  He reports that his coughing has significantly improved and is currently well controlled taking Qvar 40 g, 2 inhalations via spacer device daily, and montelukast 10 mg daily bedtime.  His nasal symptoms are currently well controlled with allergen avoidance measures, Nasonex, and nasal saline irrigation as needed.  He is interested in the possibility of starting aeroallergen immunotherapy to reduce symptoms and decrease medication requirement.    Assessment and plan: Perennial and seasonal allergic rhinitis  Continue appropriate allergen avoidance measures, Nasonex as needed, and nasal saline irrigation as needed.  The risks and benefits of aeroallergen immunotherapy have been discussed. The patient is motivated to initiate immunotherapy if insurance coverage is favorable. He will let us know how he would like to proceed.  Cough variant asthma Improved and well-controlled.  For now, continue Qvar 40 g, 2 inhalations via spacer device daily, montelukast 10 mg daily bedtime, and albuterol HFA every 4-6 hours as needed.  During respiratory tract infections or asthma flares, increase Qvar 40 g to 3 inhalations 3 times per day until symptoms have returned to baseline.  Subjective and objective measures of pulmonary function will be followed and the treatment plan will be adjusted accordingly.  Diagnostics: Spirometry:  Normal with an FEV1 of 89% predicted.  Please see scanned spirometry results for details.    Physical examination: Blood pressure 128/78, pulse  80, temperature 98.1 F (36.7 C), temperature source Oral, resp. rate 16.  General: Alert, interactive, in no acute distress. HEENT: TMs pearly gray, turbinates mildly edematous without discharge, post-pharynx mildly erythematous. Neck: Supple without lymphadenopathy. Lungs: Clear to auscultation without wheezing, rhonchi or rales. CV: Normal S1, S2 without murmurs. Skin: Warm and dry, without lesions or rashes.  The following portions of the patient's history were reviewed and updated as appropriate: allergies, current medications, past family history, past medical history, past social history, past surgical history and problem list.    Medication List       Accurate as of 10/14/15 11:38 AM. Always use your most recent med list.          Albuterol Sulfate 108 (90 Base) MCG/ACT Aepb Commonly known as:  PROAIR RESPICLICK Inhale 2 puffs into the lungs every 6 (six) hours as needed.   atorvastatin 20 MG tablet Commonly known as:  LIPITOR Take 20 mg by mouth daily.   beclomethasone 40 MCG/ACT inhaler Commonly known as:  QVAR Inhale 2 puffs into the lungs 2 (two) times daily. With spacer   fluticasone 50 MCG/ACT nasal spray Commonly known as:  FLONASE Place 2 sprays into both nostrils daily.   hydrOXYzine 25 MG tablet Commonly known as:  ATARAX/VISTARIL Take 25 mg by mouth 3 (three) times daily as needed.   levothyroxine 200 MCG tablet Commonly known as:  SYNTHROID, LEVOTHROID Take 200 mcg by mouth daily before breakfast.   metFORMIN 500 MG tablet Commonly known as:  GLUCOPHAGE Take 1,000 mg by mouth 2 (two) times daily with a meal.   methocarbamol 500 MG tablet Commonly known as:  ROBAXIN Take 1 tablet (500 mg total) by mouth every 8 (eight) hours as  needed for muscle spasms.   mometasone 50 MCG/ACT nasal spray Commonly known as:  NASONEX Place 1-2 sprays into the nose daily.   mometasone 50 MCG/ACT nasal spray Commonly known as:  NASONEX Place 2 sprays into the  nose daily.   montelukast 10 MG tablet Commonly known as:  SINGULAIR Take 1 tablet (10 mg total) by mouth at bedtime.   multivitamin with minerals Tabs tablet Take 1 tablet by mouth daily.   omeprazole 20 MG capsule Commonly known as:  PRILOSEC Take 20 mg by mouth daily as needed (acid reflux).   oxyCODONE-acetaminophen 5-325 MG tablet Commonly known as:  PERCOCET Take 1-2 tablets by mouth every 4 (four) hours as needed.   rosuvastatin 40 MG tablet Commonly known as:  CRESTOR Take 40 mg by mouth daily.   temazepam 30 MG capsule Commonly known as:  RESTORIL Take 1 capsule (30 mg total) by mouth at bedtime as needed for sleep.       No Known Allergies  I appreciate the opportunity to take part in Shaun Velasquez's care. Please do not hesitate to contact me with questions.  Sincerely,   R. Edgar Frisk, MD   Addendum:  The patient has informed us that he would like to proceed with aeroallergen immunotherapy. Risks and benefits have been discussed and consent has been signed. Immunotherapy orders have been written and submitted.

## 2015-10-14 NOTE — Patient Instructions (Addendum)
Perennial and seasonal allergic rhinitis  Continue appropriate allergen avoidance measures, Nasonex as needed, and nasal saline irrigation as needed.  The risks and benefits of aeroallergen immunotherapy have been discussed. The patient is motivated to initiate immunotherapy if insurance coverage is favorable. He will let us know how he would like to proceed.  Cough variant asthma Improved and well-controlled.  For now, continue Qvar 40 g, 2 inhalations via spacer device daily, montelukast 10 mg daily bedtime, and albuterol HFA every 4-6 hours as needed.  During respiratory tract infections or asthma flares, increase Qvar 40 g to 3 inhalations 3 times per day until symptoms have returned to baseline.  Subjective and objective measures of pulmonary function will be followed and the treatment plan will be adjusted accordingly.   Return in about 4 months (around 02/14/2016), or if symptoms worsen or fail to improve.

## 2015-10-14 NOTE — Assessment & Plan Note (Signed)
Improved and well-controlled.  For now, continue Qvar 40 g, 2 inhalations via spacer device daily, montelukast 10 mg daily bedtime, and albuterol HFA every 4-6 hours as needed.  During respiratory tract infections or asthma flares, increase Qvar 40 g to 3 inhalations 3 times per day until symptoms have returned to baseline.  Subjective and objective measures of pulmonary function will be followed and the treatment plan will be adjusted accordingly.

## 2015-10-23 NOTE — Progress Notes (Signed)
Vials made 10-23-15.  JM

## 2015-10-23 NOTE — Addendum Note (Signed)
Addended by: Golda Acre C on: 10/23/2015 01:45 PM   Modules accepted: Orders

## 2015-10-28 ENCOUNTER — Ambulatory Visit (INDEPENDENT_AMBULATORY_CARE_PROVIDER_SITE_OTHER): Payer: BLUE CROSS/BLUE SHIELD | Admitting: *Deleted

## 2015-10-28 DIAGNOSIS — J3089 Other allergic rhinitis: Secondary | ICD-10-CM

## 2015-10-28 MED ORDER — EPINEPHRINE 0.3 MG/0.3ML IJ SOAJ: 0.3000 mg | Freq: Once | INTRAMUSCULAR | 1 refills | Status: AC

## 2015-10-29 DIAGNOSIS — J301 Allergic rhinitis due to pollen: Secondary | ICD-10-CM | POA: Diagnosis not present

## 2015-10-29 NOTE — Progress Notes (Signed)
Immunotherapy   Patient Details  Name: Shaun Velasquez MRN: [id number] Date of Birth: Oct 01, 1960  10/29/2015  Quin Hoop Balon started injections for  [facility] [place].  Following schedule: A  Frequency:2 times per week Epi-Pen:Epi-Pen Available  Consent signed and patient instructions given. No problems after 30 minutes in office.    Constance Holster 10/29/2015, 4:12 PM

## 2015-10-30 DIAGNOSIS — J3089 Other allergic rhinitis: Secondary | ICD-10-CM | POA: Diagnosis not present

## 2015-10-31 ENCOUNTER — Ambulatory Visit (INDEPENDENT_AMBULATORY_CARE_PROVIDER_SITE_OTHER): Payer: BLUE CROSS/BLUE SHIELD | Admitting: *Deleted

## 2015-10-31 DIAGNOSIS — J3089 Other allergic rhinitis: Secondary | ICD-10-CM | POA: Diagnosis not present

## 2015-11-04 ENCOUNTER — Ambulatory Visit (INDEPENDENT_AMBULATORY_CARE_PROVIDER_SITE_OTHER): Payer: BLUE CROSS/BLUE SHIELD | Admitting: *Deleted

## 2015-11-04 DIAGNOSIS — J3089 Other allergic rhinitis: Secondary | ICD-10-CM | POA: Diagnosis not present

## 2015-11-04 MED ORDER — EPINEPHRINE 0.3 MG/0.3ML IJ SOAJ: 0.3000 mg | Freq: Once | INTRAMUSCULAR | 1 refills | Status: AC

## 2015-11-07 ENCOUNTER — Ambulatory Visit (INDEPENDENT_AMBULATORY_CARE_PROVIDER_SITE_OTHER): Payer: BLUE CROSS/BLUE SHIELD | Admitting: *Deleted

## 2015-11-07 DIAGNOSIS — J309 Allergic rhinitis, unspecified: Secondary | ICD-10-CM | POA: Diagnosis not present

## 2015-11-07 DIAGNOSIS — J3089 Other allergic rhinitis: Secondary | ICD-10-CM

## 2015-11-13 ENCOUNTER — Ambulatory Visit (INDEPENDENT_AMBULATORY_CARE_PROVIDER_SITE_OTHER): Payer: BLUE CROSS/BLUE SHIELD

## 2015-11-13 DIAGNOSIS — J309 Allergic rhinitis, unspecified: Secondary | ICD-10-CM | POA: Diagnosis not present

## 2015-11-18 ENCOUNTER — Ambulatory Visit (INDEPENDENT_AMBULATORY_CARE_PROVIDER_SITE_OTHER): Payer: BLUE CROSS/BLUE SHIELD | Admitting: *Deleted

## 2015-11-18 DIAGNOSIS — J309 Allergic rhinitis, unspecified: Secondary | ICD-10-CM

## 2015-11-21 ENCOUNTER — Ambulatory Visit (INDEPENDENT_AMBULATORY_CARE_PROVIDER_SITE_OTHER): Payer: BLUE CROSS/BLUE SHIELD | Admitting: *Deleted

## 2015-11-21 DIAGNOSIS — J309 Allergic rhinitis, unspecified: Secondary | ICD-10-CM

## 2015-11-25 ENCOUNTER — Ambulatory Visit (INDEPENDENT_AMBULATORY_CARE_PROVIDER_SITE_OTHER): Payer: BLUE CROSS/BLUE SHIELD | Admitting: *Deleted

## 2015-11-25 DIAGNOSIS — J309 Allergic rhinitis, unspecified: Secondary | ICD-10-CM | POA: Diagnosis not present

## 2015-11-28 ENCOUNTER — Ambulatory Visit (INDEPENDENT_AMBULATORY_CARE_PROVIDER_SITE_OTHER): Payer: BLUE CROSS/BLUE SHIELD | Admitting: *Deleted

## 2015-11-28 DIAGNOSIS — J309 Allergic rhinitis, unspecified: Secondary | ICD-10-CM

## 2015-12-02 ENCOUNTER — Ambulatory Visit (INDEPENDENT_AMBULATORY_CARE_PROVIDER_SITE_OTHER): Payer: BLUE CROSS/BLUE SHIELD | Admitting: *Deleted

## 2015-12-02 DIAGNOSIS — J309 Allergic rhinitis, unspecified: Secondary | ICD-10-CM | POA: Diagnosis not present

## 2015-12-04 ENCOUNTER — Ambulatory Visit (INDEPENDENT_AMBULATORY_CARE_PROVIDER_SITE_OTHER): Payer: BLUE CROSS/BLUE SHIELD | Admitting: *Deleted

## 2015-12-04 DIAGNOSIS — J309 Allergic rhinitis, unspecified: Secondary | ICD-10-CM | POA: Diagnosis not present

## 2015-12-09 ENCOUNTER — Ambulatory Visit (INDEPENDENT_AMBULATORY_CARE_PROVIDER_SITE_OTHER): Payer: BLUE CROSS/BLUE SHIELD | Admitting: *Deleted

## 2015-12-09 DIAGNOSIS — J309 Allergic rhinitis, unspecified: Secondary | ICD-10-CM | POA: Diagnosis not present

## 2015-12-13 ENCOUNTER — Ambulatory Visit (INDEPENDENT_AMBULATORY_CARE_PROVIDER_SITE_OTHER): Payer: BLUE CROSS/BLUE SHIELD

## 2015-12-13 DIAGNOSIS — J309 Allergic rhinitis, unspecified: Secondary | ICD-10-CM

## 2015-12-17 ENCOUNTER — Ambulatory Visit (INDEPENDENT_AMBULATORY_CARE_PROVIDER_SITE_OTHER): Payer: BLUE CROSS/BLUE SHIELD | Admitting: *Deleted

## 2015-12-17 DIAGNOSIS — J309 Allergic rhinitis, unspecified: Secondary | ICD-10-CM | POA: Diagnosis not present

## 2015-12-20 ENCOUNTER — Ambulatory Visit (INDEPENDENT_AMBULATORY_CARE_PROVIDER_SITE_OTHER): Payer: BLUE CROSS/BLUE SHIELD | Admitting: *Deleted

## 2015-12-20 DIAGNOSIS — J309 Allergic rhinitis, unspecified: Secondary | ICD-10-CM

## 2015-12-23 ENCOUNTER — Ambulatory Visit (INDEPENDENT_AMBULATORY_CARE_PROVIDER_SITE_OTHER): Payer: BLUE CROSS/BLUE SHIELD | Admitting: *Deleted

## 2015-12-23 DIAGNOSIS — J309 Allergic rhinitis, unspecified: Secondary | ICD-10-CM | POA: Diagnosis not present

## 2015-12-25 NOTE — Progress Notes (Signed)
Vials to be made 12-25-15  jm

## 2015-12-26 ENCOUNTER — Ambulatory Visit (INDEPENDENT_AMBULATORY_CARE_PROVIDER_SITE_OTHER): Payer: BLUE CROSS/BLUE SHIELD

## 2015-12-26 DIAGNOSIS — J309 Allergic rhinitis, unspecified: Secondary | ICD-10-CM | POA: Diagnosis not present

## 2015-12-31 ENCOUNTER — Ambulatory Visit (INDEPENDENT_AMBULATORY_CARE_PROVIDER_SITE_OTHER): Payer: BLUE CROSS/BLUE SHIELD | Admitting: *Deleted

## 2015-12-31 DIAGNOSIS — J309 Allergic rhinitis, unspecified: Secondary | ICD-10-CM

## 2016-01-01 DIAGNOSIS — J301 Allergic rhinitis due to pollen: Secondary | ICD-10-CM

## 2016-01-02 DIAGNOSIS — J3089 Other allergic rhinitis: Secondary | ICD-10-CM

## 2016-01-02 NOTE — Progress Notes (Signed)
Mt vials made.  jm

## 2016-01-03 ENCOUNTER — Ambulatory Visit (INDEPENDENT_AMBULATORY_CARE_PROVIDER_SITE_OTHER): Payer: BLUE CROSS/BLUE SHIELD

## 2016-01-03 DIAGNOSIS — J309 Allergic rhinitis, unspecified: Secondary | ICD-10-CM | POA: Diagnosis not present

## 2016-01-08 ENCOUNTER — Ambulatory Visit (INDEPENDENT_AMBULATORY_CARE_PROVIDER_SITE_OTHER): Payer: BLUE CROSS/BLUE SHIELD | Admitting: *Deleted

## 2016-01-08 DIAGNOSIS — J309 Allergic rhinitis, unspecified: Secondary | ICD-10-CM

## 2016-01-10 ENCOUNTER — Ambulatory Visit (INDEPENDENT_AMBULATORY_CARE_PROVIDER_SITE_OTHER): Payer: BLUE CROSS/BLUE SHIELD

## 2016-01-10 DIAGNOSIS — J309 Allergic rhinitis, unspecified: Secondary | ICD-10-CM

## 2016-01-14 ENCOUNTER — Ambulatory Visit (INDEPENDENT_AMBULATORY_CARE_PROVIDER_SITE_OTHER): Payer: BLUE CROSS/BLUE SHIELD | Admitting: *Deleted

## 2016-01-14 DIAGNOSIS — J309 Allergic rhinitis, unspecified: Secondary | ICD-10-CM

## 2016-01-15 ENCOUNTER — Other Ambulatory Visit: Payer: Self-pay | Admitting: Allergy and Immunology

## 2016-01-17 ENCOUNTER — Ambulatory Visit (INDEPENDENT_AMBULATORY_CARE_PROVIDER_SITE_OTHER): Payer: BLUE CROSS/BLUE SHIELD

## 2016-01-17 DIAGNOSIS — J309 Allergic rhinitis, unspecified: Secondary | ICD-10-CM | POA: Diagnosis not present

## 2016-01-20 ENCOUNTER — Ambulatory Visit (INDEPENDENT_AMBULATORY_CARE_PROVIDER_SITE_OTHER): Payer: BLUE CROSS/BLUE SHIELD | Admitting: *Deleted

## 2016-01-20 DIAGNOSIS — J309 Allergic rhinitis, unspecified: Secondary | ICD-10-CM

## 2016-01-22 NOTE — Addendum Note (Signed)
Addended by: Felipa Emory on: 01/22/2016 12:20 PM   Modules accepted: Orders

## 2016-01-23 ENCOUNTER — Ambulatory Visit (INDEPENDENT_AMBULATORY_CARE_PROVIDER_SITE_OTHER): Payer: BLUE CROSS/BLUE SHIELD

## 2016-01-23 DIAGNOSIS — J309 Allergic rhinitis, unspecified: Secondary | ICD-10-CM | POA: Diagnosis not present

## 2016-01-27 ENCOUNTER — Ambulatory Visit (INDEPENDENT_AMBULATORY_CARE_PROVIDER_SITE_OTHER): Payer: BLUE CROSS/BLUE SHIELD | Admitting: *Deleted

## 2016-01-27 DIAGNOSIS — J309 Allergic rhinitis, unspecified: Secondary | ICD-10-CM | POA: Diagnosis not present

## 2016-02-03 ENCOUNTER — Ambulatory Visit (INDEPENDENT_AMBULATORY_CARE_PROVIDER_SITE_OTHER): Payer: BLUE CROSS/BLUE SHIELD

## 2016-02-03 DIAGNOSIS — J309 Allergic rhinitis, unspecified: Secondary | ICD-10-CM

## 2016-02-06 ENCOUNTER — Ambulatory Visit (INDEPENDENT_AMBULATORY_CARE_PROVIDER_SITE_OTHER): Payer: BLUE CROSS/BLUE SHIELD

## 2016-02-06 DIAGNOSIS — J309 Allergic rhinitis, unspecified: Secondary | ICD-10-CM

## 2016-02-11 ENCOUNTER — Ambulatory Visit (INDEPENDENT_AMBULATORY_CARE_PROVIDER_SITE_OTHER): Payer: BLUE CROSS/BLUE SHIELD

## 2016-02-11 DIAGNOSIS — J309 Allergic rhinitis, unspecified: Secondary | ICD-10-CM | POA: Diagnosis not present

## 2016-02-14 ENCOUNTER — Ambulatory Visit (INDEPENDENT_AMBULATORY_CARE_PROVIDER_SITE_OTHER): Payer: BLUE CROSS/BLUE SHIELD

## 2016-02-14 DIAGNOSIS — J309 Allergic rhinitis, unspecified: Secondary | ICD-10-CM | POA: Diagnosis not present

## 2016-02-21 ENCOUNTER — Ambulatory Visit (INDEPENDENT_AMBULATORY_CARE_PROVIDER_SITE_OTHER): Payer: BLUE CROSS/BLUE SHIELD | Admitting: *Deleted

## 2016-02-21 DIAGNOSIS — J309 Allergic rhinitis, unspecified: Secondary | ICD-10-CM | POA: Diagnosis not present

## 2016-03-02 ENCOUNTER — Ambulatory Visit (INDEPENDENT_AMBULATORY_CARE_PROVIDER_SITE_OTHER): Payer: BLUE CROSS/BLUE SHIELD

## 2016-03-02 DIAGNOSIS — J309 Allergic rhinitis, unspecified: Secondary | ICD-10-CM

## 2016-03-09 ENCOUNTER — Ambulatory Visit (INDEPENDENT_AMBULATORY_CARE_PROVIDER_SITE_OTHER): Payer: BLUE CROSS/BLUE SHIELD | Admitting: *Deleted

## 2016-03-09 DIAGNOSIS — J309 Allergic rhinitis, unspecified: Secondary | ICD-10-CM

## 2016-03-16 ENCOUNTER — Ambulatory Visit (INDEPENDENT_AMBULATORY_CARE_PROVIDER_SITE_OTHER): Payer: BLUE CROSS/BLUE SHIELD

## 2016-03-16 DIAGNOSIS — J309 Allergic rhinitis, unspecified: Secondary | ICD-10-CM

## 2016-03-23 ENCOUNTER — Ambulatory Visit (INDEPENDENT_AMBULATORY_CARE_PROVIDER_SITE_OTHER): Payer: BLUE CROSS/BLUE SHIELD | Admitting: *Deleted

## 2016-03-23 DIAGNOSIS — J309 Allergic rhinitis, unspecified: Secondary | ICD-10-CM

## 2016-03-27 ENCOUNTER — Encounter: Payer: Self-pay | Admitting: Physician Assistant

## 2016-03-27 ENCOUNTER — Ambulatory Visit (INDEPENDENT_AMBULATORY_CARE_PROVIDER_SITE_OTHER): Payer: BLUE CROSS/BLUE SHIELD | Admitting: Physician Assistant

## 2016-03-27 VITALS — BP 122/80 | HR 78 | Temp 98.1°F | Resp 14 | Ht 70.0 in | Wt 209.0 lb

## 2016-03-27 DIAGNOSIS — E039 Hypothyroidism, unspecified: Secondary | ICD-10-CM

## 2016-03-27 DIAGNOSIS — M25512 Pain in left shoulder: Secondary | ICD-10-CM

## 2016-03-27 DIAGNOSIS — E785 Hyperlipidemia, unspecified: Secondary | ICD-10-CM | POA: Diagnosis not present

## 2016-03-27 DIAGNOSIS — M25511 Pain in right shoulder: Secondary | ICD-10-CM

## 2016-03-27 DIAGNOSIS — J3089 Other allergic rhinitis: Secondary | ICD-10-CM

## 2016-03-27 DIAGNOSIS — IMO0001 Reserved for inherently not codable concepts without codable children: Secondary | ICD-10-CM

## 2016-03-27 DIAGNOSIS — E1165 Type 2 diabetes mellitus with hyperglycemia: Secondary | ICD-10-CM | POA: Diagnosis not present

## 2016-03-27 NOTE — Progress Notes (Signed)
Patient presents to clinic today to establish care.  Acute Concerns: Patient endorses chronic pain of shoulders bilaterally due to significant OA. Was evaluation by Orthopedics previously and was told his options were limited. Would like assessment by an Integrative pain specialist.   Chronic Issues: Hyperlipidemia -- Is currently on rosuvastatin 40 mg daily. Endorses taking as directed. Is tolerating well without side effects. Endorses he could have a better diet but tries to make good decisions. No regular exercise at present. Denies history of stroke or heart attack.  Environmental Allergies -- followed by Allergist (Dr. Starling Manns). Has albuterol, Singulair and gets frequent injections. Endorses well-controlled with this regimen.    Hypertension -- Mild per patient. Is controlled with Lisinopril 5 mg daily. Patient denies chest pain, palpitations, lightheadedness, dizziness, vision changes or frequent headaches. Denies history of stroke or heart attack.  Diabetes Mellitus II -- Followed by Endocrinology (Dr. Chalmers Cater). Is currently on Metformin 1000 mg BID and Januvia 100 mg daily. Is taking as directed. Is not checking sugars at present. Endorses last A1C was in the low 7s. No records for review today. Denies history of neuropathy. Was sent to Nephrology at one point due to proteinuria. Was assessed and told everything looked good. Was placed on ACEI. Last eye examination  01/12/16 per patient. Denies history of retinopathy.  Hypothyroidism -- Currently on levothyroxine 200 mcg daily. Is taking as directed without side effect. Last TSH was last summer per patient. Was getting checked once yearly as had been stable for quite some time. Will need records.  Health Maintenance: Colonoscopy -- up-to-date.  Past Medical History:  Diagnosis Date  . Allergy   . Anxiety   . Arthritis   . Collapsed lung   . Diabetes mellitus without complication ([place])   . Eczema   . GERD (gastroesophageal reflux  disease)   . History of chickenpox   . Hyperlipemia   . Hypothyroidism   . PONV (postoperative nausea and vomiting)   . Psoriasis     Past Surgical History:  Procedure Laterality Date  . CHEST TUBE INSERTION     pt had collapsed lung  . COLONOSCOPY W/ POLYPECTOMY    . ELBOW SURGERY Right 2009  . INCISION AND DRAINAGE PERIRECTAL ABSCESS    . REVERSE SHOULDER ARTHROPLASTY Right 12/27/2014   Procedure: RIGHT REVERSE SHOULDER ARTHROPLASTY;  Surgeon: Justice Britain, MD;  Location: [facility];  Service: Orthopedics;  Laterality: Right;  . ROTATOR CUFF REPAIR Right 2011  . SHOULDER ARTHROSCOPY W/ LABRAL REPAIR Right   . SHOULDER ARTHROSCOPY WITH ROTATOR CUFF REPAIR Left 2003  . SHOULDER ARTHROSCOPY WITH SUBACROMIAL DECOMPRESSION Left     Current Outpatient Prescriptions on File Prior to Visit  Medication Sig Dispense Refill  . Albuterol Sulfate (PROAIR RESPICLICK) 154 (90 Base) MCG/ACT AEPB Inhale 2 puffs into the lungs every 6 (six) hours as needed. 1 each 3  . fluticasone (FLONASE) 50 MCG/ACT nasal spray Place 2 sprays into both nostrils daily. 16 g 5  . levothyroxine (SYNTHROID, LEVOTHROID) 200 MCG tablet Take 200 mcg by mouth daily before breakfast.     . metFORMIN (GLUCOPHAGE) 500 MG tablet Take 1,000 mg by mouth 2 (two) times daily with a meal.    . montelukast (SINGULAIR) 10 MG tablet Take 1 tablet (10 mg total) by mouth at bedtime. 30 tablet 5  . Multiple Vitamin (MULTIVITAMIN WITH MINERALS) TABS tablet Take 1 tablet by mouth daily.    [name] omeprazole (PRILOSEC) 20 MG capsule Take 20 mg by mouth  daily as needed (acid reflux).   5  . rosuvastatin (CRESTOR) 40 MG tablet Take 40 mg by mouth daily.     No current facility-administered medications on file prior to visit.     No Known Allergies  Family History  Problem Relation Age of Onset  . Allergic rhinitis Mother   . Lung cancer Mother   . Asthma Sister   . Cancer Sister     Skin    Social History   Social History  . Marital  status: Married    Spouse name: N/A  . Number of children: N/A  . Years of education: N/A   Occupational History  . Not on file.   Social History Main Topics  . Smoking status: Former Smoker    Years: 15.00    Types: Cigarettes    Quit date: 1996  . Smokeless tobacco: Former Systems developer  . Alcohol use No  . Drug use: No  . Sexual activity: Not on file   Other Topics Concern  . Not on file   Social History Narrative  . No narrative on file   Review of Systems  Constitutional: Negative for fever and weight loss.  HENT: Negative for ear discharge, ear pain, hearing loss and tinnitus.   Eyes: Negative for blurred vision, double vision, photophobia and pain.  Respiratory: Negative for cough and shortness of breath.   Cardiovascular: Negative for chest pain and palpitations.  Gastrointestinal: Negative for abdominal pain, blood in stool, constipation, diarrhea, heartburn, melena, nausea and vomiting.  Genitourinary: Negative for dysuria, flank pain, frequency, hematuria and urgency.  Musculoskeletal: Positive for joint pain. Negative for falls.  Neurological: Negative for dizziness, loss of consciousness and headaches.  Endo/Heme/Allergies: Negative for environmental allergies.  Psychiatric/Behavioral: Negative for depression, hallucinations, substance abuse and suicidal ideas. The patient is not nervous/anxious and does not have insomnia.     BP 122/80   Pulse 78   Temp 98.1 F (36.7 C) (Oral)   Resp 14   Ht 5\' 10"  (1.778 m)   Wt 209 lb (94.8 kg)   SpO2 98%   BMI 29.99 kg/m   Physical Exam  Constitutional: He is oriented to person, place, and time and well-developed, well-nourished, and in no distress. No distress.  HENT:  Right Ear: Tympanic membrane, external ear and ear canal normal.  Left Ear: Tympanic membrane, external ear and ear canal normal.  Eyes: Conjunctivae and EOM are normal. Pupils are equal, round, and reactive to light. Right eye exhibits no discharge. Left  eye exhibits no discharge. No scleral icterus.  Neck: Neck supple.  Cardiovascular: Normal rate, regular rhythm and normal heart sounds.  Exam reveals no gallop and no friction rub.   No murmur heard. Pulmonary/Chest: Effort normal and breath sounds normal. No respiratory distress. He has no wheezes. He has no rales.  Lymphadenopathy:    He has no cervical adenopathy.  Neurological: He is alert and oriented to person, place, and time. He has normal strength.  Reflex Scores:      Patellar reflexes are 2+ on the right side and 2+ on the left side. Skin: Skin is warm and dry. He is not diaphoretic.  Psychiatric: Affect normal.   Assessment/Plan: Perennial and seasonal allergic rhinitis Followed by Allergy and Asthma. Continue care as directed by specialist.  Hypothyroidism Will obtain records from old PCP to review lab results. CPE scheduled. Will update labs at that time. Continue current regimen for now.  Uncontrolled diabetes mellitus type 2 without complications ([place])  Followed by Endocrinology. Will request records from them for review so we can further assess glycemic control. Eye examination up-to-date. Is on ACEI. Followed by Nephrology due to some transient proteinuria which patient notes had resolved itself. Will get records from there as well.    Hyperlipidemia Tolerating statin well. Dietary and exercise recommendations reviewed with patient. CPE scheduled.   Bilateral shoulder pain Referral to pain medicine placed for assessment and other options to control pain.     Leeanne Rio, PA-C

## 2016-03-27 NOTE — Progress Notes (Signed)
Pre visit review using our clinic review tool, if applicable. No additional management support is needed unless otherwise documented below in the visit note. 

## 2016-03-27 NOTE — Patient Instructions (Signed)
Please continue current medication regimen. Follow-up with your new Endocrinologist as scheduled today. We will let them continue Diabetes management.   I will call you once I have received and reviewed your records so we can determine next steps.  Follow-up with me in May for your physical.  You will be contacted for assessment by Pain Management.

## 2016-03-29 DIAGNOSIS — M25512 Pain in left shoulder: Secondary | ICD-10-CM | POA: Insufficient documentation

## 2016-03-29 DIAGNOSIS — M25511 Pain in right shoulder: Secondary | ICD-10-CM | POA: Insufficient documentation

## 2016-03-29 NOTE — Assessment & Plan Note (Signed)
Tolerating statin well. Dietary and exercise recommendations reviewed with patient. CPE scheduled.

## 2016-03-29 NOTE — Assessment & Plan Note (Signed)
Will obtain records from old PCP to review lab results. CPE scheduled. Will update labs at that time. Continue current regimen for now.

## 2016-03-29 NOTE — Assessment & Plan Note (Signed)
Followed by Endocrinology. Will request records from them for review so we can further assess glycemic control. Eye examination up-to-date. Is on ACEI. Followed by Nephrology due to some transient proteinuria which patient notes had resolved itself. Will get records from there as well.

## 2016-03-29 NOTE — Assessment & Plan Note (Signed)
Referral to pain medicine placed for assessment and other options to control pain.

## 2016-03-29 NOTE — Assessment & Plan Note (Signed)
Followed by Allergy and Asthma. Continue care as directed by specialist.

## 2016-03-30 ENCOUNTER — Ambulatory Visit (INDEPENDENT_AMBULATORY_CARE_PROVIDER_SITE_OTHER): Payer: BLUE CROSS/BLUE SHIELD | Admitting: *Deleted

## 2016-03-30 DIAGNOSIS — J309 Allergic rhinitis, unspecified: Secondary | ICD-10-CM

## 2016-04-04 ENCOUNTER — Other Ambulatory Visit: Payer: Self-pay | Admitting: Allergy and Immunology

## 2016-04-04 DIAGNOSIS — J4530 Mild persistent asthma, uncomplicated: Secondary | ICD-10-CM

## 2016-04-04 DIAGNOSIS — J3089 Other allergic rhinitis: Secondary | ICD-10-CM

## 2016-04-06 ENCOUNTER — Other Ambulatory Visit: Payer: Self-pay | Admitting: Allergy and Immunology

## 2016-04-06 ENCOUNTER — Ambulatory Visit (INDEPENDENT_AMBULATORY_CARE_PROVIDER_SITE_OTHER): Payer: BLUE CROSS/BLUE SHIELD

## 2016-04-06 DIAGNOSIS — J309 Allergic rhinitis, unspecified: Secondary | ICD-10-CM

## 2016-04-13 ENCOUNTER — Ambulatory Visit (INDEPENDENT_AMBULATORY_CARE_PROVIDER_SITE_OTHER): Payer: BLUE CROSS/BLUE SHIELD | Admitting: *Deleted

## 2016-04-13 DIAGNOSIS — J309 Allergic rhinitis, unspecified: Secondary | ICD-10-CM | POA: Diagnosis not present

## 2016-04-13 LAB — TSH: TSH: 0.76 u[IU]/mL (ref 0.41–5.90)

## 2016-04-15 ENCOUNTER — Encounter: Payer: Self-pay | Admitting: Emergency Medicine

## 2016-04-20 ENCOUNTER — Ambulatory Visit (INDEPENDENT_AMBULATORY_CARE_PROVIDER_SITE_OTHER): Payer: BLUE CROSS/BLUE SHIELD | Admitting: *Deleted

## 2016-04-20 DIAGNOSIS — J309 Allergic rhinitis, unspecified: Secondary | ICD-10-CM

## 2016-04-27 ENCOUNTER — Ambulatory Visit (INDEPENDENT_AMBULATORY_CARE_PROVIDER_SITE_OTHER): Payer: BLUE CROSS/BLUE SHIELD | Admitting: *Deleted

## 2016-04-27 DIAGNOSIS — J309 Allergic rhinitis, unspecified: Secondary | ICD-10-CM

## 2016-05-01 LAB — CBC AND DIFFERENTIAL
HCT: 43 % (ref 41–53)
Hemoglobin: 14.6 g/dL (ref 13.5–17.5)
WBC: 5.4 10^3/mL

## 2016-05-01 LAB — HEPATIC FUNCTION PANEL
ALT: 25 U/L (ref 10–40)
AST: 19 U/L (ref 14–40)
Alkaline Phosphatase: 67 U/L (ref 25–125)
Bilirubin, Total: 0.5 mg/dL

## 2016-05-01 LAB — BASIC METABOLIC PANEL
BUN: 15 mg/dL (ref 4–21)
Creatinine: 1 mg/dL (ref 0.6–1.3)
Glucose: 117 mg/dL

## 2016-05-05 ENCOUNTER — Ambulatory Visit (INDEPENDENT_AMBULATORY_CARE_PROVIDER_SITE_OTHER): Payer: BLUE CROSS/BLUE SHIELD | Admitting: *Deleted

## 2016-05-05 DIAGNOSIS — J309 Allergic rhinitis, unspecified: Secondary | ICD-10-CM

## 2016-05-10 ENCOUNTER — Other Ambulatory Visit: Payer: Self-pay | Admitting: Allergy and Immunology

## 2016-05-10 DIAGNOSIS — J3089 Other allergic rhinitis: Secondary | ICD-10-CM

## 2016-05-10 DIAGNOSIS — J4530 Mild persistent asthma, uncomplicated: Secondary | ICD-10-CM

## 2016-05-11 NOTE — Telephone Encounter (Signed)
I denied refill for Montelukast. Patient received 1 courtesy refill on 04/06/2016. Patient was last seen 10/14/2015. Patient was asked to follow up in 4 months. Patient needs office visit for further refills.

## 2016-05-12 ENCOUNTER — Ambulatory Visit (INDEPENDENT_AMBULATORY_CARE_PROVIDER_SITE_OTHER): Payer: BLUE CROSS/BLUE SHIELD | Admitting: *Deleted

## 2016-05-12 DIAGNOSIS — J309 Allergic rhinitis, unspecified: Secondary | ICD-10-CM

## 2016-05-21 ENCOUNTER — Ambulatory Visit (INDEPENDENT_AMBULATORY_CARE_PROVIDER_SITE_OTHER): Payer: BLUE CROSS/BLUE SHIELD | Admitting: *Deleted

## 2016-05-21 DIAGNOSIS — J309 Allergic rhinitis, unspecified: Secondary | ICD-10-CM | POA: Diagnosis not present

## 2016-05-21 NOTE — Progress Notes (Signed)
Patient presents to clinic today for annual exam.  Patient is fasting for labs. Body mass index is 30.71 kg/m. Diet -- well-balanced diet overall. Is watching portion sizes.  Exercise -- does yard work but no regular exercise regimen.   Acute Concerns: Denies acute concerns at today's visit.   Chronic Issues: Reviewed at new patient appointment on 03/27/16. Is continuing to take all chronic medications as directed. Followed by Endocrinology for hypothyroidism, DM II and Hypogonadism. Is getting injections at Endo's office starting 2 weeks ago. Crestor recently stopped due to elevated enzyme drawn by specialist. Is unsure of what enzyme this was.  Health Maintenance: Colonoscopy -- up-to-date. 2011 per patient.   Past Medical History:  Diagnosis Date  . Allergy   . Anxiety   . Arthritis   . Collapsed lung   . Diabetes mellitus without complication ([place])   . Eczema   . GERD (gastroesophageal reflux disease)   . History of chickenpox   . Hyperlipemia   . Hypothyroidism   . PONV (postoperative nausea and vomiting)   . Psoriasis     Past Surgical History:  Procedure Laterality Date  . CHEST TUBE INSERTION     pt had collapsed lung  . COLONOSCOPY W/ POLYPECTOMY    . ELBOW SURGERY Right 2009  . INCISION AND DRAINAGE PERIRECTAL ABSCESS    . REVERSE SHOULDER ARTHROPLASTY Right 12/27/2014   Procedure: RIGHT REVERSE SHOULDER ARTHROPLASTY;  Surgeon: Justice Britain, MD;  Location: [facility];  Service: Orthopedics;  Laterality: Right;  . ROTATOR CUFF REPAIR Right 2011  . SHOULDER ARTHROSCOPY W/ LABRAL REPAIR Right   . SHOULDER ARTHROSCOPY WITH ROTATOR CUFF REPAIR Left 2003  . SHOULDER ARTHROSCOPY WITH SUBACROMIAL DECOMPRESSION Left     Current Outpatient Prescriptions on File Prior to Visit  Medication Sig Dispense Refill  . Albuterol Sulfate (PROAIR RESPICLICK) 741 (90 Base) MCG/ACT AEPB Inhale 2 puffs into the lungs every 6 (six) hours as needed. 1 each 3  . fluticasone (FLONASE)  50 MCG/ACT nasal spray Place 2 sprays into both nostrils daily. 16 g 5  . hydrOXYzine (ATARAX/VISTARIL) 25 MG tablet TAKE 1 TABLET BY MOUTH 3 TIMES A DAY AS NEEDED 90 tablet 2  . JANUVIA 100 MG tablet Take 100 mg by mouth daily.  11  . levothyroxine (SYNTHROID, LEVOTHROID) 200 MCG tablet Take 200 mcg by mouth daily before breakfast.     . lisinopril (PRINIVIL,ZESTRIL) 5 MG tablet Take 5 mg by mouth daily.  6  . metFORMIN (GLUCOPHAGE) 500 MG tablet Take 1,000 mg by mouth 2 (two) times daily with a meal.    . montelukast (SINGULAIR) 10 MG tablet TAKE 1 TABLET (10 MG TOTAL) BY MOUTH AT BEDTIME. 30 tablet 0  . Multiple Vitamin (MULTIVITAMIN WITH MINERALS) TABS tablet Take 1 tablet by mouth daily.    [name] omeprazole (PRILOSEC) 20 MG capsule Take 20 mg by mouth daily as needed (acid reflux).   5  . rosuvastatin (CRESTOR) 40 MG tablet Take 40 mg by mouth daily.     No current facility-administered medications on file prior to visit.     No Known Allergies  Family History  Problem Relation Age of Onset  . Allergic rhinitis Mother   . Lung cancer Mother   . Asthma Sister   . Cancer Sister        Skin    Social History   Social History  . Marital status: Married    Spouse name: N/A  . Number of children:  N/A  . Years of education: N/A   Occupational History  . Not on file.   Social History Main Topics  . Smoking status: Former Smoker    Years: 15.00    Types: Cigarettes    Quit date: 1996  . Smokeless tobacco: Former Systems developer  . Alcohol use No  . Drug use: No  . Sexual activity: Not on file   Other Topics Concern  . Not on file   Social History Narrative  . No narrative on file    Review of Systems  Constitutional: Negative for fever and weight loss.  HENT: Negative for ear discharge, ear pain, hearing loss and tinnitus.   Eyes: Negative for blurred vision, double vision, photophobia and pain.  Respiratory: Negative for cough and shortness of breath.   Cardiovascular:  Negative for chest pain and palpitations.  Gastrointestinal: Negative for abdominal pain, blood in stool, constipation, diarrhea, heartburn, melena, nausea and vomiting.  Genitourinary: Negative for dysuria, flank pain, frequency, hematuria and urgency.  Musculoskeletal: Negative for falls.  Neurological: Negative for dizziness, loss of consciousness and headaches.  Endo/Heme/Allergies: Negative for environmental allergies.  Psychiatric/Behavioral: Negative for depression, hallucinations, substance abuse and suicidal ideas. The patient is not nervous/anxious and does not have insomnia.    BP 116/74   Pulse 75   Temp 98.3 F (36.8 C) (Oral)   Resp 14   Ht 5\' 10"  (1.778 m)   Wt 214 lb (97.1 kg)   SpO2 98%   BMI 30.71 kg/m   Physical Exam  Constitutional: He is oriented to person, place, and time and well-developed, well-nourished, and in no distress.  HENT:  Head: Normocephalic and atraumatic.  Right Ear: External ear normal.  Left Ear: External ear normal.  Nose: Nose normal.  Mouth/Throat: Oropharynx is clear and moist. No oropharyngeal exudate.  Eyes: Conjunctivae and EOM are normal. Pupils are equal, round, and reactive to light.  Neck: Neck supple. No thyromegaly present.  Cardiovascular: Normal rate, regular rhythm, normal heart sounds and intact distal pulses.   Pulmonary/Chest: Effort normal and breath sounds normal. No respiratory distress. He has no wheezes. He has no rales. He exhibits no tenderness.  Abdominal: Soft. Bowel sounds are normal. He exhibits no distension and no mass. There is no tenderness. There is no rebound and no guarding.  Genitourinary: Testes/scrotum normal and penis normal. No discharge found.  Lymphadenopathy:    He has no cervical adenopathy.  Neurological: He is alert and oriented to person, place, and time.  Skin: Skin is warm and dry. No rash noted.  Psychiatric: Affect normal.  Vitals reviewed.  Assessment/Plan: Visit for preventive health  examination Depression screen negative. Health Maintenance reviewed. Preventive schedule discussed and handout given in AVS. Will obtain fasting labs today.   Uncontrolled diabetes mellitus type 2 without complications ([place]) Followed by Endocrinology.  Has appointment scheduled in 2 weeks for repeat A1C.  He is to have lab results sent to [place] from Endo for abstraction.   Hypothyroidism Continue care as directed by Endocrinology.  Hypogonadism in male Followed by Endocrinology.  PSA level today.  Hyperlipidemia Crestor on hold. Will get lab results from specialist for review so we can determine restarting medication.    Leeanne Rio, PA-C

## 2016-05-22 ENCOUNTER — Encounter: Payer: Self-pay | Admitting: Physician Assistant

## 2016-05-22 ENCOUNTER — Ambulatory Visit (INDEPENDENT_AMBULATORY_CARE_PROVIDER_SITE_OTHER): Payer: BLUE CROSS/BLUE SHIELD | Admitting: Physician Assistant

## 2016-05-22 VITALS — BP 116/74 | HR 75 | Temp 98.3°F | Resp 14 | Ht 70.0 in | Wt 214.0 lb

## 2016-05-22 DIAGNOSIS — Z0000 Encounter for general adult medical examination without abnormal findings: Secondary | ICD-10-CM | POA: Diagnosis not present

## 2016-05-22 DIAGNOSIS — E039 Hypothyroidism, unspecified: Secondary | ICD-10-CM | POA: Diagnosis not present

## 2016-05-22 DIAGNOSIS — E785 Hyperlipidemia, unspecified: Secondary | ICD-10-CM | POA: Diagnosis not present

## 2016-05-22 DIAGNOSIS — E1165 Type 2 diabetes mellitus with hyperglycemia: Secondary | ICD-10-CM | POA: Diagnosis not present

## 2016-05-22 DIAGNOSIS — IMO0001 Reserved for inherently not codable concepts without codable children: Secondary | ICD-10-CM

## 2016-05-22 DIAGNOSIS — E291 Testicular hypofunction: Secondary | ICD-10-CM | POA: Insufficient documentation

## 2016-05-22 LAB — LIPID PANEL
Cholesterol: 323 mg/dL — ABNORMAL HIGH (ref 0–200)
HDL: 54.1 mg/dL (ref >39.00)
LDL Cholesterol: 234 mg/dL — ABNORMAL HIGH (ref 0–99)
NonHDL: 268.42
Total CHOL/HDL Ratio: 6
Triglycerides: 172 mg/dL — ABNORMAL HIGH (ref 0.0–149.0)
VLDL: 34.4 mg/dL (ref 0.0–40.0)

## 2016-05-22 LAB — PSA: PSA: 1.2 ng/mL (ref 0.10–4.00)

## 2016-05-22 NOTE — Patient Instructions (Signed)
Please go to the lab for blood work.   Our office will call you with your results unless you have chosen to receive results via MyChart.  If your blood work is normal we will follow-up each year for physicals and as scheduled for chronic medical problems.  If anything is abnormal we will treat accordingly and get you in for a follow-up.  I am checking your PSA level today to make sure this is stable for being on testosterone injections     Preventive Care 40-64 Years, Male Preventive care refers to lifestyle choices and visits with your health care provider that can promote health and wellness. What does preventive care include?  A yearly physical exam. This is also called an annual well check.  Dental exams once or twice a year.  Routine eye exams. Ask your health care provider how often you should have your eyes checked.  Personal lifestyle choices, including:  Daily care of your teeth and gums.  Regular physical activity.  Eating a healthy diet.  Avoiding tobacco and drug use.  Limiting alcohol use.  Practicing safe sex.  Taking low-dose aspirin every day starting at age 45. What happens during an annual well check? The services and screenings done by your health care provider during your annual well check will depend on your age, overall health, lifestyle risk factors, and family history of disease. Counseling  Your health care provider may ask you questions about your:  Alcohol use.  Tobacco use.  Drug use.  Emotional well-being.  Home and relationship well-being.  Sexual activity.  Eating habits.  Work and work Statistician. Screening  You may have the following tests or measurements:  Height, weight, and BMI.  Blood pressure.  Lipid and cholesterol levels. These may be checked every 5 years, or more frequently if you are over 9 years old.  Skin check.  Lung cancer screening. You may have this screening every year starting at age 90 if you  have a 30-pack-year history of smoking and currently smoke or have quit within the past 15 years.  Fecal occult blood test (FOBT) of the stool. You may have this test every year starting at age 17.  Flexible sigmoidoscopy or colonoscopy. You may have a sigmoidoscopy every 5 years or a colonoscopy every 10 years starting at age 25.  Prostate cancer screening. Recommendations will vary depending on your family history and other risks.  Hepatitis C blood test.  Hepatitis B blood test.  Sexually transmitted disease (STD) testing.  Diabetes screening. This is done by checking your blood sugar (glucose) after you have not eaten for a while (fasting). You may have this done every 1-3 years. Discuss your test results, treatment options, and if necessary, the need for more tests with your health care provider. Vaccines  Your health care provider may recommend certain vaccines, such as:  Influenza vaccine. This is recommended every year.  Tetanus, diphtheria, and acellular pertussis (Tdap, Td) vaccine. You may need a Td booster every 10 years.  Varicella vaccine. You may need this if you have not been vaccinated.  Zoster vaccine. You may need this after age 64.  Measles, mumps, and rubella (MMR) vaccine. You may need at least one dose of MMR if you were born in 1957 or later. You may also need a second dose.  Pneumococcal 13-valent conjugate (PCV13) vaccine. You may need this if you have certain conditions and have not been vaccinated.  Pneumococcal polysaccharide (PPSV23) vaccine. You may need one or two  doses if you smoke cigarettes or if you have certain conditions.  Meningococcal vaccine. You may need this if you have certain conditions.  Hepatitis A vaccine. You may need this if you have certain conditions or if you travel or work in places where you may be exposed to hepatitis A.  Hepatitis B vaccine. You may need this if you have certain conditions or if you travel or work in places  where you may be exposed to hepatitis B.  Haemophilus influenzae type b (Hib) vaccine. You may need this if you have certain risk factors. Talk to your health care provider about which screenings and vaccines you need and how often you need them. This information is not intended to replace advice given to you by your health care provider. Make sure you discuss any questions you have with your health care provider. Document Released: 01/25/2015 Document Revised: 09/18/2015 Document Reviewed: 10/30/2014 Elsevier Interactive Patient Education  2017 [employer].

## 2016-05-22 NOTE — Progress Notes (Signed)
Pre visit review using our clinic review tool, if applicable. No additional management support is needed unless otherwise documented below in the visit note. 

## 2016-05-22 NOTE — Assessment & Plan Note (Signed)
Continue care as directed by Endocrinology.

## 2016-05-22 NOTE — Assessment & Plan Note (Signed)
Crestor on hold. Will get lab results from specialist for review so we can determine restarting medication.

## 2016-05-22 NOTE — Assessment & Plan Note (Signed)
Depression screen negative. Health Maintenance reviewed. Preventive schedule discussed and handout given in AVS. Will obtain fasting labs today.  

## 2016-05-22 NOTE — Assessment & Plan Note (Signed)
Followed by Endocrinology.  PSA level today.

## 2016-05-22 NOTE — Assessment & Plan Note (Signed)
Followed by Endocrinology.  Has appointment scheduled in 2 weeks for repeat A1C.  He is to have lab results sent to [place] from Endo for abstraction.

## 2016-05-26 ENCOUNTER — Encounter: Payer: Self-pay | Admitting: Physician Assistant

## 2016-05-26 DIAGNOSIS — E039 Hypothyroidism, unspecified: Secondary | ICD-10-CM

## 2016-05-26 MED ORDER — LEVOTHYROXINE SODIUM 200 MCG PO TABS: 200.0000 ug | ORAL_TABLET | Freq: Every day | ORAL | 1 refills | Status: DC

## 2016-05-29 ENCOUNTER — Ambulatory Visit (INDEPENDENT_AMBULATORY_CARE_PROVIDER_SITE_OTHER): Payer: BLUE CROSS/BLUE SHIELD

## 2016-05-29 DIAGNOSIS — J309 Allergic rhinitis, unspecified: Secondary | ICD-10-CM | POA: Diagnosis not present

## 2016-06-05 ENCOUNTER — Ambulatory Visit (INDEPENDENT_AMBULATORY_CARE_PROVIDER_SITE_OTHER): Payer: BLUE CROSS/BLUE SHIELD

## 2016-06-05 DIAGNOSIS — J309 Allergic rhinitis, unspecified: Secondary | ICD-10-CM

## 2016-06-12 ENCOUNTER — Ambulatory Visit (INDEPENDENT_AMBULATORY_CARE_PROVIDER_SITE_OTHER): Payer: BLUE CROSS/BLUE SHIELD

## 2016-06-12 DIAGNOSIS — J309 Allergic rhinitis, unspecified: Secondary | ICD-10-CM

## 2016-06-22 ENCOUNTER — Ambulatory Visit (INDEPENDENT_AMBULATORY_CARE_PROVIDER_SITE_OTHER): Payer: BLUE CROSS/BLUE SHIELD

## 2016-06-22 DIAGNOSIS — J309 Allergic rhinitis, unspecified: Secondary | ICD-10-CM | POA: Diagnosis not present

## 2016-07-03 ENCOUNTER — Ambulatory Visit (INDEPENDENT_AMBULATORY_CARE_PROVIDER_SITE_OTHER): Payer: BLUE CROSS/BLUE SHIELD

## 2016-07-03 DIAGNOSIS — J309 Allergic rhinitis, unspecified: Secondary | ICD-10-CM | POA: Diagnosis not present

## 2016-07-08 ENCOUNTER — Encounter: Payer: Self-pay | Admitting: Physical Medicine & Rehabilitation

## 2016-07-14 ENCOUNTER — Ambulatory Visit (INDEPENDENT_AMBULATORY_CARE_PROVIDER_SITE_OTHER): Payer: BLUE CROSS/BLUE SHIELD | Admitting: *Deleted

## 2016-07-14 DIAGNOSIS — J309 Allergic rhinitis, unspecified: Secondary | ICD-10-CM

## 2016-07-27 ENCOUNTER — Ambulatory Visit (INDEPENDENT_AMBULATORY_CARE_PROVIDER_SITE_OTHER): Payer: BLUE CROSS/BLUE SHIELD | Admitting: *Deleted

## 2016-07-27 DIAGNOSIS — J309 Allergic rhinitis, unspecified: Secondary | ICD-10-CM

## 2016-08-03 ENCOUNTER — Other Ambulatory Visit: Payer: Self-pay | Admitting: Physician Assistant

## 2016-08-04 ENCOUNTER — Ambulatory Visit (INDEPENDENT_AMBULATORY_CARE_PROVIDER_SITE_OTHER): Payer: BLUE CROSS/BLUE SHIELD | Admitting: *Deleted

## 2016-08-04 DIAGNOSIS — J309 Allergic rhinitis, unspecified: Secondary | ICD-10-CM

## 2016-08-04 NOTE — Telephone Encounter (Signed)
LMOVM advising patient to schedule a lab visit to recheck his thyroid level. Orders in chart.

## 2016-08-10 ENCOUNTER — Ambulatory Visit: Payer: BLUE CROSS/BLUE SHIELD | Admitting: Physical Medicine & Rehabilitation

## 2016-08-10 ENCOUNTER — Encounter: Payer: BLUE CROSS/BLUE SHIELD | Attending: Physical Medicine & Rehabilitation

## 2016-08-13 ENCOUNTER — Ambulatory Visit (INDEPENDENT_AMBULATORY_CARE_PROVIDER_SITE_OTHER): Payer: BLUE CROSS/BLUE SHIELD | Admitting: *Deleted

## 2016-08-13 DIAGNOSIS — J309 Allergic rhinitis, unspecified: Secondary | ICD-10-CM

## 2016-08-21 ENCOUNTER — Ambulatory Visit (INDEPENDENT_AMBULATORY_CARE_PROVIDER_SITE_OTHER): Payer: BLUE CROSS/BLUE SHIELD

## 2016-08-21 DIAGNOSIS — J309 Allergic rhinitis, unspecified: Secondary | ICD-10-CM

## 2016-08-28 DIAGNOSIS — J301 Allergic rhinitis due to pollen: Secondary | ICD-10-CM | POA: Diagnosis not present

## 2016-09-01 ENCOUNTER — Ambulatory Visit (INDEPENDENT_AMBULATORY_CARE_PROVIDER_SITE_OTHER): Payer: BLUE CROSS/BLUE SHIELD | Admitting: *Deleted

## 2016-09-01 DIAGNOSIS — J309 Allergic rhinitis, unspecified: Secondary | ICD-10-CM | POA: Diagnosis not present

## 2016-09-10 ENCOUNTER — Ambulatory Visit (INDEPENDENT_AMBULATORY_CARE_PROVIDER_SITE_OTHER): Payer: BLUE CROSS/BLUE SHIELD | Admitting: *Deleted

## 2016-09-10 DIAGNOSIS — J309 Allergic rhinitis, unspecified: Secondary | ICD-10-CM

## 2016-09-17 ENCOUNTER — Ambulatory Visit (INDEPENDENT_AMBULATORY_CARE_PROVIDER_SITE_OTHER): Payer: BLUE CROSS/BLUE SHIELD | Admitting: *Deleted

## 2016-09-17 DIAGNOSIS — J309 Allergic rhinitis, unspecified: Secondary | ICD-10-CM

## 2016-09-24 ENCOUNTER — Ambulatory Visit (INDEPENDENT_AMBULATORY_CARE_PROVIDER_SITE_OTHER): Payer: BLUE CROSS/BLUE SHIELD | Admitting: *Deleted

## 2016-09-24 DIAGNOSIS — J309 Allergic rhinitis, unspecified: Secondary | ICD-10-CM

## 2016-10-02 ENCOUNTER — Ambulatory Visit (INDEPENDENT_AMBULATORY_CARE_PROVIDER_SITE_OTHER): Payer: BLUE CROSS/BLUE SHIELD

## 2016-10-02 DIAGNOSIS — J309 Allergic rhinitis, unspecified: Secondary | ICD-10-CM | POA: Diagnosis not present

## 2016-10-09 ENCOUNTER — Ambulatory Visit (INDEPENDENT_AMBULATORY_CARE_PROVIDER_SITE_OTHER): Payer: BLUE CROSS/BLUE SHIELD | Admitting: Physician Assistant

## 2016-10-09 ENCOUNTER — Ambulatory Visit (INDEPENDENT_AMBULATORY_CARE_PROVIDER_SITE_OTHER): Payer: Self-pay

## 2016-10-09 ENCOUNTER — Encounter: Payer: Self-pay | Admitting: Physician Assistant

## 2016-10-09 VITALS — BP 120/82 | HR 70 | Temp 98.5°F | Resp 14 | Ht 70.0 in | Wt 222.0 lb

## 2016-10-09 DIAGNOSIS — Z23 Encounter for immunization: Secondary | ICD-10-CM | POA: Diagnosis not present

## 2016-10-09 DIAGNOSIS — L409 Psoriasis, unspecified: Secondary | ICD-10-CM | POA: Diagnosis not present

## 2016-10-09 DIAGNOSIS — E785 Hyperlipidemia, unspecified: Secondary | ICD-10-CM

## 2016-10-09 DIAGNOSIS — IMO0001 Reserved for inherently not codable concepts without codable children: Secondary | ICD-10-CM

## 2016-10-09 DIAGNOSIS — J309 Allergic rhinitis, unspecified: Secondary | ICD-10-CM

## 2016-10-09 DIAGNOSIS — E1165 Type 2 diabetes mellitus with hyperglycemia: Secondary | ICD-10-CM

## 2016-10-09 DIAGNOSIS — E039 Hypothyroidism, unspecified: Secondary | ICD-10-CM | POA: Diagnosis not present

## 2016-10-09 NOTE — Assessment & Plan Note (Signed)
Was taken off of Januvia by Endocrinology. Has not followed up as scheduled. Is taking Metformin as directed. Foot exam updated today. Pneumovax and flu shot updated today. Eye exam up-to-date. Continue current regimen and ACEI. Repeat labs today. Will alter regimen accordingly.

## 2016-10-09 NOTE — Patient Instructions (Signed)
Please go to the lab for blood work. I will call you with your results.  Please continue chronic medications as directed for now. We will be altering based on lab results.   Increase aerobic exercise to at least 150 minutes per week.   We will plan on a routine follow-up in 6 months. This may change based on lab results.    Diabetes Mellitus and Exercise Exercising regularly is important for your overall health, especially when you have diabetes (diabetes mellitus). Exercising is not only about losing weight. It has many health benefits, such as increasing muscle strength and bone density and reducing body fat and stress. This leads to improved fitness, flexibility, and endurance, all of which result in better overall health. Exercise has additional benefits for people with diabetes, including:  Reducing appetite.  Helping to lower and control blood glucose.  Lowering blood pressure.  Helping to control amounts of fatty substances (lipids) in the blood, such as cholesterol and triglycerides.  Helping the body to respond better to insulin (improving insulin sensitivity).  Reducing how much insulin the body needs.  Decreasing the risk for heart disease by: ? Lowering cholesterol and triglyceride levels. ? Increasing the levels of good cholesterol. ? Lowering blood glucose levels.  What is my activity plan? Your health care provider or certified diabetes educator can help you make a plan for the type and frequency of exercise (activity plan) that works for you. Make sure that you:  Do at least 150 minutes of moderate-intensity or vigorous-intensity exercise each week. This could be brisk walking, biking, or water aerobics. ? Do stretching and strength exercises, such as yoga or weightlifting, at least 2 times a week. ? Spread out your activity over at least 3 days of the week.  Get some form of physical activity every day. ? Do not go more than 2 days in a row without some kind of  physical activity. ? Avoid being inactive for more than 90 minutes at a time. Take frequent breaks to walk or stretch.  Choose a type of exercise or activity that you enjoy, and set realistic goals.  Start slowly, and gradually increase the intensity of your exercise over time.  What do I need to know about managing my diabetes?  Check your blood glucose before and after exercising. ? If your blood glucose is higher than 240 mg/dL (13.3 mmol/L) before you exercise, check your urine for ketones. If you have ketones in your urine, do not exercise until your blood glucose returns to normal.  Know the symptoms of low blood glucose (hypoglycemia) and how to treat it. Your risk for hypoglycemia increases during and after exercise. Common symptoms of hypoglycemia can include: ? Hunger. ? Anxiety. ? Sweating and feeling clammy. ? Confusion. ? Dizziness or feeling light-headed. ? Increased heart rate or palpitations. ? Blurry vision. ? Tingling or numbness around the mouth, lips, or tongue. ? Tremors or shakes. ? Irritability.  Keep a rapid-acting carbohydrate snack available before, during, and after exercise to help prevent or treat hypoglycemia.  Avoid injecting insulin into areas of the body that are going to be exercised. For example, avoid injecting insulin into: ? The arms, when playing tennis. ? The legs, when jogging.  Keep records of your exercise habits. Doing this can help you and your health care provider adjust your diabetes management plan as needed. Write down: ? Food that you eat before and after you exercise. ? Blood glucose levels before and after you exercise. ?  The type and amount of exercise you have done. ? When your insulin is expected to peak, if you use insulin. Avoid exercising at times when your insulin is peaking.  When you start a new exercise or activity, work with your health care provider to make sure the activity is safe for you, and to adjust your  insulin, medicines, or food intake as needed.  Drink plenty of water while you exercise to prevent dehydration or heat stroke. Drink enough fluid to keep your urine clear or pale yellow. This information is not intended to replace advice given to you by your health care provider. Make sure you discuss any questions you have with your health care provider. Document Released: 03/21/2003 Document Revised: 07/19/2015 Document Reviewed: 06/10/2015 Elsevier Interactive Patient Education  2018 [employer].

## 2016-10-09 NOTE — Assessment & Plan Note (Addendum)
LDL significantly elevated. Off of statin due to CK elevation. Will recheck labs today. Consider starting Livalo. Start 81 mg ASA daily.

## 2016-10-09 NOTE — Progress Notes (Signed)
History of Present Illness: Patient is a 56 y.o. male who presents to clinic today for follow-up of Diabetes Mellitus II, previously controlled.  Patient previously followed by Endocrinology but has not seen them in 6 months.  Patient currently on medication regimen of Metformin 500 mg BID. Was on Januvia 100 mg but was stopped by Endocrinologist. Is taking medications as directed.  Is also on ACEI. Endorses diet is very sporadic -- usually eats out for breakfast and lunch. Dinner is typically home cooked. No real pattern to diet. Is exercising twice weekly -- mainly cardio.  Denies chest pain, vision changes, neuropathy symptoms. Endorses weight gain recently. Has hypothyroidism, currently on Levothyroxine 200 mcg daily. Is taking as directed. Is overdue for repeat TSH.    Latest Maintenance: A1C --  Lab Results  Component Value Date   HGBA1C 7.2 05/28/2015   Diabetic Eye Exam -- Up-to-date. [facility]. Will request records. Urine Microalbumin -- Patient currently on ACEI.  Foot Exam -- Due. Denies concerns today.  Past Medical History:  Diagnosis Date  . Allergy   . Anxiety   . Arthritis   . Collapsed lung   . Diabetes mellitus without complication ([place])   . Eczema   . GERD (gastroesophageal reflux disease)   . History of chickenpox   . Hyperlipemia   . Hypothyroidism   . PONV (postoperative nausea and vomiting)   . Psoriasis     Current Outpatient Prescriptions on File Prior to Visit  Medication Sig Dispense Refill  . Albuterol Sulfate (PROAIR RESPICLICK) 270 (90 Base) MCG/ACT AEPB Inhale 2 puffs into the lungs every 6 (six) hours as needed. 1 each 3  . fluticasone (FLONASE) 50 MCG/ACT nasal spray Place 2 sprays into both nostrils daily. 16 g 5  . hydrOXYzine (ATARAX/VISTARIL) 25 MG tablet TAKE 1 TABLET BY MOUTH 3 TIMES A DAY AS NEEDED 90 tablet 2  . levothyroxine (SYNTHROID, LEVOTHROID) 200 MCG tablet TAKE 1 TABLET (200 MCG TOTAL) BY MOUTH DAILY BEFORE BREAKFAST. 30 tablet  0  . lisinopril (PRINIVIL,ZESTRIL) 5 MG tablet Take 5 mg by mouth daily.  6  . metFORMIN (GLUCOPHAGE) 500 MG tablet Take 1,000 mg by mouth 2 (two) times daily with a meal.    . montelukast (SINGULAIR) 10 MG tablet TAKE 1 TABLET (10 MG TOTAL) BY MOUTH AT BEDTIME. 30 tablet 0  . Multiple Vitamin (MULTIVITAMIN WITH MINERALS) TABS tablet Take 1 tablet by mouth daily.    [name] omeprazole (PRILOSEC) 20 MG capsule Take 20 mg by mouth daily as needed (acid reflux).   5  . rosuvastatin (CRESTOR) 40 MG tablet Take 40 mg by mouth daily.     No current facility-administered medications on file prior to visit.     No Known Allergies  Family History  Problem Relation Age of Onset  . Allergic rhinitis Mother   . Lung cancer Mother   . Asthma Sister   . Cancer Sister        Skin    Social History   Social History  . Marital status: Married    Spouse name: N/A  . Number of children: N/A  . Years of education: N/A   Social History Main Topics  . Smoking status: Former Smoker    Years: 15.00    Types: Cigarettes    Quit date: 1996  . Smokeless tobacco: Former Systems developer  . Alcohol use No  . Drug use: No  . Sexual activity: Not Asked   Other Topics Concern  .  None   Social History Narrative  . None   Review of Systems: Pertinent ROS are listed in HPI  Physical Examination: BP 120/82   Pulse 70   Temp 98.5 F (36.9 C) (Oral)   Resp 14   Ht 5\' 10"  (1.778 m)   Wt 222 lb (100.7 kg)   SpO2 98%   BMI 31.85 kg/m  General appearance: alert, cooperative, appears stated age and no distress Head: Normocephalic, without obvious abnormality, atraumatic Lungs: clear to auscultation bilaterally Heart: regular rate and rhythm, S1, S2 normal, no murmur, click, rub or gallop Extremities: extremities normal, atraumatic, no cyanosis or edema Pulses: 2+ and symmetric Skin: Skin color, texture, turgor normal. No rashes or lesions Neurologic: Alert and oriented X 3, normal strength and tone. Normal  symmetric reflexes. Normal coordination and gait  Diabetic Foot Exam - Simple   Simple Foot Form Diabetic Foot exam was performed with the following findings:  Yes 10/09/2016  8:55 AM  Visual Inspection No deformities, no ulcerations, no other skin breakdown bilaterally:  Yes See comments:  Yes Sensation Testing Intact to touch and monofilament testing bilaterally:  Yes Pulse Check Posterior Tibialis and Dorsalis pulse intact bilaterally:  Yes Comments Some mild hyperkeratosis underneath nail of R great toe    Assessment/Plan: Uncontrolled diabetes mellitus type 2 without complications ([place]) Was taken off of Januvia by Endocrinology. Has not followed up as scheduled. Is taking Metformin as directed. Foot exam updated today. Pneumovax and flu shot updated today. Eye exam up-to-date. Continue current regimen and ACEI. Repeat labs today. Will alter regimen accordingly.   Hypothyroidism Taking medication as directed. Repeat labs today.  Hyperlipidemia LDL significantly elevated. Off of statin due to CK elevation. Will recheck labs today. Consider starting Livalo. Start 81 mg ASA daily.

## 2016-10-09 NOTE — Assessment & Plan Note (Signed)
Taking medication as directed. Repeat labs today.

## 2016-10-09 NOTE — Progress Notes (Signed)
Pre visit review using our clinic review tool, if applicable. No additional management support is needed unless otherwise documented below in the visit note. 

## 2016-10-12 ENCOUNTER — Other Ambulatory Visit (INDEPENDENT_AMBULATORY_CARE_PROVIDER_SITE_OTHER): Payer: BLUE CROSS/BLUE SHIELD

## 2016-10-12 ENCOUNTER — Ambulatory Visit (INDEPENDENT_AMBULATORY_CARE_PROVIDER_SITE_OTHER): Payer: BLUE CROSS/BLUE SHIELD | Admitting: *Deleted

## 2016-10-12 DIAGNOSIS — J309 Allergic rhinitis, unspecified: Secondary | ICD-10-CM | POA: Diagnosis not present

## 2016-10-12 DIAGNOSIS — E785 Hyperlipidemia, unspecified: Secondary | ICD-10-CM | POA: Diagnosis not present

## 2016-10-12 LAB — COMPREHENSIVE METABOLIC PANEL
ALT: 19 U/L (ref 0–53)
AST: 15 U/L (ref 0–37)
Albumin: 4.6 g/dL (ref 3.5–5.2)
Alkaline Phosphatase: 68 U/L (ref 39–117)
BUN: 14 mg/dL (ref 6–23)
CO2: 29 mEq/L (ref 19–32)
Calcium: 9.9 mg/dL (ref 8.4–10.5)
Chloride: 98 mEq/L (ref 96–112)
Creatinine, Ser: 0.96 mg/dL (ref 0.40–1.50)
GFR: 85.67 mL/min (ref >60.00)
Glucose, Bld: 135 mg/dL — ABNORMAL HIGH (ref 70–99)
Potassium: 4.9 mEq/L (ref 3.5–5.1)
Sodium: 137 mEq/L (ref 135–145)
Total Bilirubin: 0.7 mg/dL (ref 0.2–1.2)
Total Protein: 6.9 g/dL (ref 6.0–8.3)

## 2016-10-12 LAB — CK: Total CK: 147 U/L (ref 7–232)

## 2016-10-12 LAB — LIPID PANEL
Cholesterol: 293 mg/dL — ABNORMAL HIGH (ref 0–200)
HDL: 49.2 mg/dL (ref >39.00)
LDL Cholesterol: 222 mg/dL — ABNORMAL HIGH (ref 0–99)
NonHDL: 244.26
Total CHOL/HDL Ratio: 6
Triglycerides: 113 mg/dL (ref 0.0–149.0)
VLDL: 22.6 mg/dL (ref 0.0–40.0)

## 2016-10-12 LAB — TSH: TSH: 40.68 u[IU]/mL — ABNORMAL HIGH (ref 0.35–4.50)

## 2016-10-12 LAB — HEMOGLOBIN A1C: Hgb A1c MFr Bld: 7.2 % — ABNORMAL HIGH (ref 4.6–6.5)

## 2016-10-15 ENCOUNTER — Other Ambulatory Visit: Payer: Self-pay | Admitting: Physician Assistant

## 2016-10-15 DIAGNOSIS — E785 Hyperlipidemia, unspecified: Secondary | ICD-10-CM

## 2016-10-15 DIAGNOSIS — E1165 Type 2 diabetes mellitus with hyperglycemia: Secondary | ICD-10-CM

## 2016-10-15 DIAGNOSIS — IMO0001 Reserved for inherently not codable concepts without codable children: Secondary | ICD-10-CM

## 2016-10-15 DIAGNOSIS — E039 Hypothyroidism, unspecified: Secondary | ICD-10-CM

## 2016-10-15 MED ORDER — METFORMIN HCL 500 MG PO TABS: 1000.0000 mg | ORAL_TABLET | Freq: Two times a day (BID) | ORAL | 3 refills | Status: DC

## 2016-10-15 MED ORDER — PITAVASTATIN CALCIUM 4 MG PO TABS: 1.0000 | ORAL_TABLET | Freq: Every day | ORAL | 3 refills | Status: DC

## 2016-10-15 MED ORDER — LEVOTHYROXINE SODIUM 200 MCG PO TABS: 200.0000 ug | ORAL_TABLET | Freq: Every day | ORAL | 3 refills | Status: DC

## 2016-10-16 ENCOUNTER — Telehealth: Payer: Self-pay | Admitting: *Deleted

## 2016-10-16 NOTE — Telephone Encounter (Signed)
PA completed for Livalo by [employer].   Medication approved through 01/11/2038.   Pharmacy has been notified for filling of medication.

## 2016-10-26 ENCOUNTER — Telehealth: Payer: Self-pay | Admitting: *Deleted

## 2016-10-26 DIAGNOSIS — E785 Hyperlipidemia, unspecified: Secondary | ICD-10-CM

## 2016-10-26 NOTE — Telephone Encounter (Signed)
Please check on PA for this -- I felt we did a PA or were going to initiate one if insurance was not paying more for the medication.

## 2016-10-26 NOTE — Telephone Encounter (Signed)
Patient states that he went to pick up the Pitavastatin Calcium 4mg  and it was going to cost him over $200.   Patient states that he verified that they ran insurance and was told yes.  He is asking if there is anything different that could be called in that won't cost as much.

## 2016-10-27 ENCOUNTER — Ambulatory Visit (INDEPENDENT_AMBULATORY_CARE_PROVIDER_SITE_OTHER): Payer: BLUE CROSS/BLUE SHIELD | Admitting: *Deleted

## 2016-10-27 DIAGNOSIS — J309 Allergic rhinitis, unspecified: Secondary | ICD-10-CM

## 2016-10-28 NOTE — Telephone Encounter (Signed)
PA was completed on 10/16/16 and insurance approval was given.  I have spoken with [facility] and insurance did pay on it, however their is still a copay for the patient of $194.56.  This is why patient is asking for a cheaper medication

## 2016-10-28 NOTE — Telephone Encounter (Signed)
Thank you for the update. That is ridiculous.   We can send in a trial of Pravachol 40 mg for him to take once daily and see how he tolerates. Ok to send in 30 with 1 refill.  Follow-up 4 weeks after starting for repeat fasting lipids and LFTS (lab only)

## 2016-10-29 MED ORDER — PRAVASTATIN SODIUM 40 MG PO TABS: 40.0000 mg | ORAL_TABLET | Freq: Every day | ORAL | 1 refills | Status: DC

## 2016-10-29 NOTE — Telephone Encounter (Signed)
Left a detailed message for patient letting him know that medication was being sent to the pharmacy.  Patient needs labwork (lipid/lft) in 4 weeks, and I have asked that he call the office to schedule a lab only appointment.     Labs have been ordered, medication has been sent to [facility] [place].

## 2016-10-29 NOTE — Addendum Note (Signed)
Addended by: Katina Dung on: 10/29/2016 12:08 PM   Modules accepted: Orders

## 2016-11-09 ENCOUNTER — Ambulatory Visit (INDEPENDENT_AMBULATORY_CARE_PROVIDER_SITE_OTHER): Payer: BLUE CROSS/BLUE SHIELD | Admitting: *Deleted

## 2016-11-09 DIAGNOSIS — J309 Allergic rhinitis, unspecified: Secondary | ICD-10-CM

## 2016-11-23 ENCOUNTER — Ambulatory Visit (INDEPENDENT_AMBULATORY_CARE_PROVIDER_SITE_OTHER): Payer: BLUE CROSS/BLUE SHIELD | Admitting: *Deleted

## 2016-11-23 DIAGNOSIS — J309 Allergic rhinitis, unspecified: Secondary | ICD-10-CM | POA: Diagnosis not present

## 2016-12-07 ENCOUNTER — Ambulatory Visit (INDEPENDENT_AMBULATORY_CARE_PROVIDER_SITE_OTHER): Payer: BLUE CROSS/BLUE SHIELD

## 2016-12-07 ENCOUNTER — Other Ambulatory Visit: Payer: Self-pay | Admitting: Physician Assistant

## 2016-12-07 DIAGNOSIS — J309 Allergic rhinitis, unspecified: Secondary | ICD-10-CM

## 2016-12-10 ENCOUNTER — Encounter: Payer: Self-pay | Admitting: *Deleted

## 2016-12-10 DIAGNOSIS — J301 Allergic rhinitis due to pollen: Secondary | ICD-10-CM | POA: Diagnosis not present

## 2016-12-10 NOTE — Progress Notes (Signed)
MT VIALS MADE

## 2016-12-14 ENCOUNTER — Telehealth: Payer: Self-pay | Admitting: Physician Assistant

## 2016-12-14 DIAGNOSIS — Z1283 Encounter for screening for malignant neoplasm of skin: Secondary | ICD-10-CM

## 2016-12-14 DIAGNOSIS — L409 Psoriasis, unspecified: Secondary | ICD-10-CM

## 2016-12-14 NOTE — Telephone Encounter (Signed)
Copied from [place] [id number]. Topic: Referral - Request >> Dec 14, 2016 10:06 AM Cecelia Byars, NT wrote: Reason for CRM: Patient still needs referral to the dermatologist please call [phone]

## 2016-12-15 NOTE — Telephone Encounter (Signed)
New referral placed for Dermatology at patient request.

## 2016-12-23 ENCOUNTER — Ambulatory Visit (INDEPENDENT_AMBULATORY_CARE_PROVIDER_SITE_OTHER): Payer: BLUE CROSS/BLUE SHIELD | Admitting: *Deleted

## 2016-12-23 DIAGNOSIS — J309 Allergic rhinitis, unspecified: Secondary | ICD-10-CM | POA: Diagnosis not present

## 2017-01-11 ENCOUNTER — Ambulatory Visit (INDEPENDENT_AMBULATORY_CARE_PROVIDER_SITE_OTHER): Payer: BLUE CROSS/BLUE SHIELD

## 2017-01-11 DIAGNOSIS — J309 Allergic rhinitis, unspecified: Secondary | ICD-10-CM | POA: Diagnosis not present

## 2017-01-11 LAB — HM DIABETES EYE EXAM

## 2017-01-19 ENCOUNTER — Ambulatory Visit (INDEPENDENT_AMBULATORY_CARE_PROVIDER_SITE_OTHER): Payer: BLUE CROSS/BLUE SHIELD | Admitting: *Deleted

## 2017-01-19 DIAGNOSIS — J309 Allergic rhinitis, unspecified: Secondary | ICD-10-CM

## 2017-01-29 ENCOUNTER — Ambulatory Visit (INDEPENDENT_AMBULATORY_CARE_PROVIDER_SITE_OTHER): Payer: BLUE CROSS/BLUE SHIELD

## 2017-01-29 DIAGNOSIS — J309 Allergic rhinitis, unspecified: Secondary | ICD-10-CM | POA: Diagnosis not present

## 2017-02-05 ENCOUNTER — Ambulatory Visit (INDEPENDENT_AMBULATORY_CARE_PROVIDER_SITE_OTHER): Payer: BLUE CROSS/BLUE SHIELD

## 2017-02-05 DIAGNOSIS — J309 Allergic rhinitis, unspecified: Secondary | ICD-10-CM

## 2017-02-12 ENCOUNTER — Ambulatory Visit (INDEPENDENT_AMBULATORY_CARE_PROVIDER_SITE_OTHER): Payer: BLUE CROSS/BLUE SHIELD

## 2017-02-12 DIAGNOSIS — J309 Allergic rhinitis, unspecified: Secondary | ICD-10-CM

## 2017-02-22 ENCOUNTER — Other Ambulatory Visit: Payer: Self-pay | Admitting: Physician Assistant

## 2017-02-22 ENCOUNTER — Encounter: Payer: Self-pay | Admitting: Emergency Medicine

## 2017-02-22 DIAGNOSIS — IMO0001 Reserved for inherently not codable concepts without codable children: Secondary | ICD-10-CM

## 2017-02-22 DIAGNOSIS — E1165 Type 2 diabetes mellitus with hyperglycemia: Principal | ICD-10-CM

## 2017-02-26 ENCOUNTER — Ambulatory Visit (INDEPENDENT_AMBULATORY_CARE_PROVIDER_SITE_OTHER): Payer: BLUE CROSS/BLUE SHIELD | Admitting: *Deleted

## 2017-02-26 DIAGNOSIS — J309 Allergic rhinitis, unspecified: Secondary | ICD-10-CM

## 2017-02-28 ENCOUNTER — Other Ambulatory Visit: Payer: Self-pay | Admitting: Physician Assistant

## 2017-02-28 DIAGNOSIS — E039 Hypothyroidism, unspecified: Secondary | ICD-10-CM

## 2017-03-12 ENCOUNTER — Ambulatory Visit (INDEPENDENT_AMBULATORY_CARE_PROVIDER_SITE_OTHER): Payer: BLUE CROSS/BLUE SHIELD | Admitting: *Deleted

## 2017-03-12 DIAGNOSIS — J309 Allergic rhinitis, unspecified: Secondary | ICD-10-CM | POA: Diagnosis not present

## 2017-03-26 ENCOUNTER — Ambulatory Visit (INDEPENDENT_AMBULATORY_CARE_PROVIDER_SITE_OTHER): Payer: BLUE CROSS/BLUE SHIELD

## 2017-03-26 DIAGNOSIS — J309 Allergic rhinitis, unspecified: Secondary | ICD-10-CM

## 2017-04-02 ENCOUNTER — Other Ambulatory Visit: Payer: Self-pay | Admitting: Physician Assistant

## 2017-04-02 DIAGNOSIS — E1165 Type 2 diabetes mellitus with hyperglycemia: Principal | ICD-10-CM

## 2017-04-02 DIAGNOSIS — IMO0001 Reserved for inherently not codable concepts without codable children: Secondary | ICD-10-CM

## 2017-04-06 NOTE — Progress Notes (Signed)
VIALS EXP 04-08-18

## 2017-04-09 ENCOUNTER — Ambulatory Visit (INDEPENDENT_AMBULATORY_CARE_PROVIDER_SITE_OTHER): Payer: BLUE CROSS/BLUE SHIELD

## 2017-04-09 DIAGNOSIS — J309 Allergic rhinitis, unspecified: Secondary | ICD-10-CM

## 2017-04-12 DIAGNOSIS — J301 Allergic rhinitis due to pollen: Secondary | ICD-10-CM | POA: Diagnosis not present

## 2017-04-16 ENCOUNTER — Other Ambulatory Visit: Payer: Self-pay | Admitting: Physician Assistant

## 2017-04-16 DIAGNOSIS — E1165 Type 2 diabetes mellitus with hyperglycemia: Principal | ICD-10-CM

## 2017-04-16 DIAGNOSIS — IMO0001 Reserved for inherently not codable concepts without codable children: Secondary | ICD-10-CM

## 2017-04-23 ENCOUNTER — Ambulatory Visit (INDEPENDENT_AMBULATORY_CARE_PROVIDER_SITE_OTHER): Payer: BLUE CROSS/BLUE SHIELD | Admitting: *Deleted

## 2017-04-23 DIAGNOSIS — J309 Allergic rhinitis, unspecified: Secondary | ICD-10-CM | POA: Diagnosis not present

## 2017-05-07 ENCOUNTER — Ambulatory Visit (INDEPENDENT_AMBULATORY_CARE_PROVIDER_SITE_OTHER): Payer: BLUE CROSS/BLUE SHIELD

## 2017-05-07 DIAGNOSIS — J309 Allergic rhinitis, unspecified: Secondary | ICD-10-CM | POA: Diagnosis not present

## 2017-05-17 ENCOUNTER — Ambulatory Visit (INDEPENDENT_AMBULATORY_CARE_PROVIDER_SITE_OTHER): Payer: BLUE CROSS/BLUE SHIELD | Admitting: *Deleted

## 2017-05-17 DIAGNOSIS — J309 Allergic rhinitis, unspecified: Secondary | ICD-10-CM

## 2017-05-25 ENCOUNTER — Ambulatory Visit (INDEPENDENT_AMBULATORY_CARE_PROVIDER_SITE_OTHER): Payer: BLUE CROSS/BLUE SHIELD | Admitting: *Deleted

## 2017-05-25 DIAGNOSIS — J309 Allergic rhinitis, unspecified: Secondary | ICD-10-CM | POA: Diagnosis not present

## 2017-06-04 ENCOUNTER — Ambulatory Visit (INDEPENDENT_AMBULATORY_CARE_PROVIDER_SITE_OTHER): Payer: BLUE CROSS/BLUE SHIELD

## 2017-06-04 DIAGNOSIS — J309 Allergic rhinitis, unspecified: Secondary | ICD-10-CM

## 2017-06-11 ENCOUNTER — Ambulatory Visit (INDEPENDENT_AMBULATORY_CARE_PROVIDER_SITE_OTHER): Payer: BLUE CROSS/BLUE SHIELD

## 2017-06-11 DIAGNOSIS — J309 Allergic rhinitis, unspecified: Secondary | ICD-10-CM | POA: Diagnosis not present

## 2017-06-22 ENCOUNTER — Ambulatory Visit (INDEPENDENT_AMBULATORY_CARE_PROVIDER_SITE_OTHER): Payer: BLUE CROSS/BLUE SHIELD | Admitting: *Deleted

## 2017-06-22 DIAGNOSIS — J309 Allergic rhinitis, unspecified: Secondary | ICD-10-CM | POA: Diagnosis not present

## 2017-07-02 ENCOUNTER — Ambulatory Visit (INDEPENDENT_AMBULATORY_CARE_PROVIDER_SITE_OTHER): Payer: BLUE CROSS/BLUE SHIELD | Admitting: Physician Assistant

## 2017-07-02 ENCOUNTER — Encounter: Payer: Self-pay | Admitting: Emergency Medicine

## 2017-07-02 ENCOUNTER — Encounter: Payer: Self-pay | Admitting: Physician Assistant

## 2017-07-02 VITALS — BP 110/80 | HR 83 | Temp 98.1°F | Resp 14 | Ht 70.0 in | Wt 212.0 lb

## 2017-07-02 DIAGNOSIS — Z1159 Encounter for screening for other viral diseases: Secondary | ICD-10-CM

## 2017-07-02 DIAGNOSIS — IMO0001 Reserved for inherently not codable concepts without codable children: Secondary | ICD-10-CM

## 2017-07-02 DIAGNOSIS — E039 Hypothyroidism, unspecified: Secondary | ICD-10-CM

## 2017-07-02 DIAGNOSIS — E1165 Type 2 diabetes mellitus with hyperglycemia: Secondary | ICD-10-CM | POA: Diagnosis not present

## 2017-07-02 DIAGNOSIS — E785 Hyperlipidemia, unspecified: Secondary | ICD-10-CM | POA: Diagnosis not present

## 2017-07-02 LAB — LIPID PANEL
Cholesterol: 312 mg/dL — ABNORMAL HIGH (ref 0–200)
HDL: 43 mg/dL (ref >39.00)
NonHDL: 268.85
Total CHOL/HDL Ratio: 7
Triglycerides: 227 mg/dL — ABNORMAL HIGH (ref 0.0–149.0)
VLDL: 45.4 mg/dL — ABNORMAL HIGH (ref 0.0–40.0)

## 2017-07-02 LAB — HEMOGLOBIN A1C: Hgb A1c MFr Bld: 12 % — ABNORMAL HIGH (ref 4.6–6.5)

## 2017-07-02 LAB — COMPREHENSIVE METABOLIC PANEL
ALT: 29 U/L (ref 0–53)
AST: 18 U/L (ref 0–37)
Albumin: 4.6 g/dL (ref 3.5–5.2)
Alkaline Phosphatase: 105 U/L (ref 39–117)
BUN: 19 mg/dL (ref 6–23)
CO2: 31 mEq/L (ref 19–32)
Calcium: 10.2 mg/dL (ref 8.4–10.5)
Chloride: 95 mEq/L — ABNORMAL LOW (ref 96–112)
Creatinine, Ser: 1.07 mg/dL (ref 0.40–1.50)
GFR: 75.4 mL/min (ref >60.00)
Glucose, Bld: 429 mg/dL — ABNORMAL HIGH (ref 70–99)
Potassium: 4.6 mEq/L (ref 3.5–5.1)
Sodium: 133 mEq/L — ABNORMAL LOW (ref 135–145)
Total Bilirubin: 0.8 mg/dL (ref 0.2–1.2)
Total Protein: 7.6 g/dL (ref 6.0–8.3)

## 2017-07-02 LAB — LDL CHOLESTEROL, DIRECT: Direct LDL: 218 mg/dL

## 2017-07-02 MED ORDER — LEVOTHYROXINE SODIUM 200 MCG PO TABS: 200.0000 ug | ORAL_TABLET | Freq: Every day | ORAL | 5 refills | Status: DC

## 2017-07-02 NOTE — Assessment & Plan Note (Signed)
Off of medication. Eye exam up-to-date. Foot exam performed. Mild onychomycosis noted of R great toe. Otherwise normal foot examination. Immunizations are up-to-date. Will obtain CMP, A1C, Lipids today. Patient to restart medications and take as directed. Follow-up will be 3-6 months depending on A1C level.

## 2017-07-02 NOTE — Assessment & Plan Note (Signed)
Patient has been off of medication for some time. Will restart today at same dose. Repeat TSH in 4-6 weeks to assess for euthyroid state. Will alter treatment accordingly.

## 2017-07-02 NOTE — Progress Notes (Signed)
History of Present Illness: Patient is a 57 y.o. male who presents to clinic today for follow-up of Diabetes Mellitus II, uncontrolled.  Patient currently on medication regimen of Metformin 1000 mg BID.  Is not taking medications as directed. Has been out of medication for several months. Endorses some increased urination.  Denies headaches, dizziness, LOC. Is not checking blood glucose as directed.   Latest Maintenance: A1C --  Lab Results  Component Value Date   HGBA1C 7.2 (H) 10/12/2016   Diabetic Eye Exam -- 01/11/2017. Normal per patient.  Urine Microalbumin -- Prescribed lisinopril but has not been taking. Will need urine microalbumin. Foot Exam -- Due.   Patient is also due for follow-up of other chronic medical conditions.  Hypothyroidism -- Currently on a regimen of levothyroxine 200 mcg daily. Has been out of medication for 2 weeks. Beforehand notes taking daily as directed. Notes some decreased energy levels since being off of medication.  Hyperlipidemia -- Patient on a regimen of Pravachol 40 mg daily. Tolerates medicine well. Has been out of medication for some time. Is trying to watch diet.   Past Medical History:  Diagnosis Date  . Allergy   . Anxiety   . Arthritis   . Collapsed lung   . Diabetes mellitus without complication ([place])   . Eczema   . GERD (gastroesophageal reflux disease)   . History of chickenpox   . Hyperlipemia   . Hypothyroidism   . PONV (postoperative nausea and vomiting)   . Psoriasis     Current Outpatient Medications on File Prior to Visit  Medication Sig Dispense Refill  . Albuterol Sulfate (PROAIR RESPICLICK) 841 (90 Base) MCG/ACT AEPB Inhale 2 puffs into the lungs every 6 (six) hours as needed. 1 each 3  . fluticasone (FLONASE) 50 MCG/ACT nasal spray Place 2 sprays into both nostrils daily. 16 g 5  . hydrOXYzine (ATARAX/VISTARIL) 25 MG tablet TAKE 1 TABLET BY MOUTH 3 TIMES A DAY AS NEEDED 90 tablet 2  . lisinopril (PRINIVIL,ZESTRIL) 5  MG tablet Take 5 mg by mouth daily.  6  . montelukast (SINGULAIR) 10 MG tablet TAKE 1 TABLET (10 MG TOTAL) BY MOUTH AT BEDTIME. 30 tablet 0  . Multiple Vitamin (MULTIVITAMIN WITH MINERALS) TABS tablet Take 1 tablet by mouth daily.    [name] omeprazole (PRILOSEC) 20 MG capsule Take 20 mg by mouth daily as needed (acid reflux).   5  . metFORMIN (GLUCOPHAGE) 500 MG tablet TAKE 2 TABLETS (1,000 MG TOTAL) BY MOUTH 2 (TWO) TIMES DAILY WITH A MEAL. (Patient not taking: Reported on 07/02/2017) 120 tablet 0  . pravastatin (PRAVACHOL) 40 MG tablet Take 1 tablet (40 mg total) by mouth daily. (Patient not taking: Reported on 07/02/2017) 30 tablet 1   No current facility-administered medications on file prior to visit.     No Known Allergies  Family History  Problem Relation Age of Onset  . Allergic rhinitis Mother   . Lung cancer Mother   . Asthma Sister   . Cancer Sister        Skin    Social History   Socioeconomic History  . Marital status: Married    Spouse name: Not on file  . Number of children: Not on file  . Years of education: Not on file  . Highest education level: Not on file  Occupational History  . Not on file  Social Needs  . Financial resource strain: Not on file  . Food insecurity:    Worry: Not  on file    Inability: Not on file  . Transportation needs:    Medical: Not on file    Non-medical: Not on file  Tobacco Use  . Smoking status: Former Smoker    Years: 15.00    Types: Cigarettes    Last attempt to quit: 1996    Years since quitting: 23.4  . Smokeless tobacco: Former Network engineer and Sexual Activity  . Alcohol use: No  . Drug use: No  . Sexual activity: Not on file  Lifestyle  . Physical activity:    Days per week: Not on file    Minutes per session: Not on file  . Stress: Not on file  Relationships  . Social connections:    Talks on phone: Not on file    Gets together: Not on file    Attends religious service: Not on file    Active member of club or  organization: Not on file    Attends meetings of clubs or organizations: Not on file    Relationship status: Not on file  Other Topics Concern  . Not on file  Social History Narrative  . Not on file    Review of Systems: Pertinent ROS are listed in HPI  Physical Examination: BP 110/80   Pulse 83   Temp 98.1 F (36.7 C) (Oral)   Resp 14   Ht 5\' 10"  (1.778 m)   Wt 212 lb (96.2 kg)   SpO2 97%   BMI 30.42 kg/m  General appearance: alert, cooperative, appears stated age and no distress Lungs: clear to auscultation bilaterally Heart: regular rate and rhythm, S1, S2 normal, no murmur, click, rub or gallop Extremities: extremities normal, atraumatic, no cyanosis or edema Pulses: 2+ and symmetric Skin: Skin color, texture, turgor normal. No rashes or lesions Neurologic: Alert and oriented X 3, normal strength and tone. Normal symmetric reflexes. Normal coordination and gait  Diabetic Foot Exam - Simple   Simple Foot Form Diabetic Foot exam was performed with the following findings:  Yes 07/02/2017 10:05 AM  Visual Inspection See comments:  Yes Sensation Testing Intact to touch and monofilament testing bilaterally:  Yes Pulse Check Posterior Tibialis and Dorsalis pulse intact bilaterally:  Yes Comments      Assessment/Plan: Hypothyroidism Patient has been off of medication for some time. Will restart today at same dose. Repeat TSH in 4-6 weeks to assess for euthyroid state. Will alter treatment accordingly.   Uncontrolled diabetes mellitus type 2 without complications ([place]) Off of medication. Eye exam up-to-date. Foot exam performed. Mild onychomycosis noted of R great toe. Otherwise normal foot examination. Immunizations are up-to-date. Will obtain CMP, A1C, Lipids today. Patient to restart medications and take as directed. Follow-up will be 3-6 months depending on A1C level.  Hyperlipidemia Not currently on statin. Patient to work on diet and exercise. Repeat fasting  lipids and LFTs today.   Onychomycosis Will check Liver enzymes today. If normal, will start Terbinafine.

## 2017-07-02 NOTE — Assessment & Plan Note (Signed)
Not currently on statin. Patient to work on diet and exercise. Repeat fasting lipids and LFTs today.

## 2017-07-02 NOTE — Patient Instructions (Signed)
Please go to the lab today for blood work.  I will call you with your results. We will alter treatment regimen(s) if indicated by your results.   Please restart the levothyroxine ASAP. Return in 6 weeks for a repeat TSH level back on the medication.  I will be sending in refills of other medications, based on lab results in case we have to adjust doses.   Please follow-up in 6 months for a complete physical.   Diabetes Mellitus and Exercise Exercising regularly is important for your overall health, especially when you have diabetes (diabetes mellitus). Exercising is not only about losing weight. It has many health benefits, such as increasing muscle strength and bone density and reducing body fat and stress. This leads to improved fitness, flexibility, and endurance, all of which result in better overall health. Exercise has additional benefits for people with diabetes, including:  Reducing appetite.  Helping to lower and control blood glucose.  Lowering blood pressure.  Helping to control amounts of fatty substances (lipids) in the blood, such as cholesterol and triglycerides.  Helping the body to respond better to insulin (improving insulin sensitivity).  Reducing how much insulin the body needs.  Decreasing the risk for heart disease by: ? Lowering cholesterol and triglyceride levels. ? Increasing the levels of good cholesterol. ? Lowering blood glucose levels.  What is my activity plan? Your health care provider or certified diabetes educator can help you make a plan for the type and frequency of exercise (activity plan) that works for you. Make sure that you:  Do at least 150 minutes of moderate-intensity or vigorous-intensity exercise each week. This could be brisk walking, biking, or water aerobics. ? Do stretching and strength exercises, such as yoga or weightlifting, at least 2 times a week. ? Spread out your activity over at least 3 days of the week.  Get some form of  physical activity every day. ? Do not go more than 2 days in a row without some kind of physical activity. ? Avoid being inactive for more than 90 minutes at a time. Take frequent breaks to walk or stretch.  Choose a type of exercise or activity that you enjoy, and set realistic goals.  Start slowly, and gradually increase the intensity of your exercise over time.  What do I need to know about managing my diabetes?  Check your blood glucose before and after exercising. ? If your blood glucose is higher than 240 mg/dL (13.3 mmol/L) before you exercise, check your urine for ketones. If you have ketones in your urine, do not exercise until your blood glucose returns to normal.  Know the symptoms of low blood glucose (hypoglycemia) and how to treat it. Your risk for hypoglycemia increases during and after exercise. Common symptoms of hypoglycemia can include: ? Hunger. ? Anxiety. ? Sweating and feeling clammy. ? Confusion. ? Dizziness or feeling light-headed. ? Increased heart rate or palpitations. ? Blurry vision. ? Tingling or numbness around the mouth, lips, or tongue. ? Tremors or shakes. ? Irritability.  Keep a rapid-acting carbohydrate snack available before, during, and after exercise to help prevent or treat hypoglycemia.  Avoid injecting insulin into areas of the body that are going to be exercised. For example, avoid injecting insulin into: ? The arms, when playing tennis. ? The legs, when jogging.  Keep records of your exercise habits. Doing this can help you and your health care provider adjust your diabetes management plan as needed. Write down: ? Food that you eat  before and after you exercise. ? Blood glucose levels before and after you exercise. ? The type and amount of exercise you have done. ? When your insulin is expected to peak, if you use insulin. Avoid exercising at times when your insulin is peaking.  When you start a new exercise or activity, work with your  health care provider to make sure the activity is safe for you, and to adjust your insulin, medicines, or food intake as needed.  Drink plenty of water while you exercise to prevent dehydration or heat stroke. Drink enough fluid to keep your urine clear or pale yellow. This information is not intended to replace advice given to you by your health care provider. Make sure you discuss any questions you have with your health care provider. Document Released: 03/21/2003 Document Revised: 07/19/2015 Document Reviewed: 06/10/2015 Elsevier Interactive Patient Education  2018 [employer].

## 2017-07-03 LAB — HEPATITIS C ANTIBODY
Hepatitis C Ab: NONREACTIVE
SIGNAL TO CUT-OFF: 0.01 (ref <1.00)

## 2017-07-05 ENCOUNTER — Other Ambulatory Visit: Payer: Self-pay | Admitting: Physician Assistant

## 2017-07-05 ENCOUNTER — Encounter: Payer: Self-pay | Admitting: Physician Assistant

## 2017-07-05 DIAGNOSIS — E785 Hyperlipidemia, unspecified: Secondary | ICD-10-CM

## 2017-07-05 DIAGNOSIS — IMO0001 Reserved for inherently not codable concepts without codable children: Secondary | ICD-10-CM

## 2017-07-05 DIAGNOSIS — E1165 Type 2 diabetes mellitus with hyperglycemia: Secondary | ICD-10-CM

## 2017-07-05 MED ORDER — METFORMIN HCL 500 MG PO TABS: 1000.0000 mg | ORAL_TABLET | Freq: Two times a day (BID) | ORAL | 0 refills | Status: DC

## 2017-07-05 MED ORDER — PRAVASTATIN SODIUM 40 MG PO TABS: 40.0000 mg | ORAL_TABLET | Freq: Every day | ORAL | 0 refills | Status: DC

## 2017-07-09 ENCOUNTER — Ambulatory Visit (INDEPENDENT_AMBULATORY_CARE_PROVIDER_SITE_OTHER): Payer: BLUE CROSS/BLUE SHIELD

## 2017-07-09 DIAGNOSIS — J309 Allergic rhinitis, unspecified: Secondary | ICD-10-CM

## 2017-07-23 ENCOUNTER — Ambulatory Visit (INDEPENDENT_AMBULATORY_CARE_PROVIDER_SITE_OTHER): Payer: BLUE CROSS/BLUE SHIELD

## 2017-07-23 DIAGNOSIS — J309 Allergic rhinitis, unspecified: Secondary | ICD-10-CM | POA: Diagnosis not present

## 2017-08-10 ENCOUNTER — Ambulatory Visit (INDEPENDENT_AMBULATORY_CARE_PROVIDER_SITE_OTHER): Payer: BLUE CROSS/BLUE SHIELD | Admitting: *Deleted

## 2017-08-10 DIAGNOSIS — J309 Allergic rhinitis, unspecified: Secondary | ICD-10-CM

## 2017-08-23 IMAGING — NM NM BONE 3 PHASE
2 series · 12 of 12 positions shown · non-contrast
Comparison: None new line radiographic correlation:  None

CLINICAL DATA: RIGHT shoulder pain radiating to hand, some
swelling, pain worse since falls in [place] and June 2015 ; history of
reverse shoulder arthroplasty December 2014

EXAM:
NUCLEAR MEDICINE 3-PHASE BONE SCAN
TECHNIQUE: Radionuclide angiographic images, immediate static blood pool
images, and 3-hour delayed static images were obtained of the
shoulders after intravenous injection of radiopharmaceutical.
RADIOPHARMACEUTICALS:  21.0 mCi Yc-44m MDP IV

[Series 1: flow · 4.14mm/px · 6 of 40 frames shown (1 of 2)]
[frame 4/40]
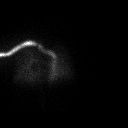
[frame 10/40]
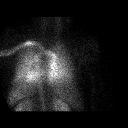
[frame 17/40]
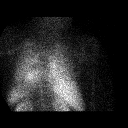
[frame 24/40]
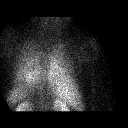
[frame 30/40]
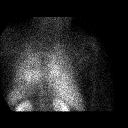
[frame 37/40]
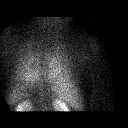

[Series 1: flow · 4.14mm/px · 6 of 40 frames shown (2 of 2)]
[frame 4/40]
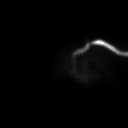
[frame 10/40]
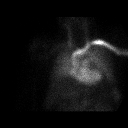
[frame 17/40]
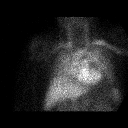
[frame 24/40]
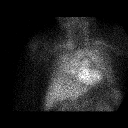
[frame 30/40]
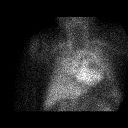
[frame 37/40]
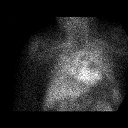

[12 of 12 positions shown; findings below may reference images not displayed]

FINDINGS: Vascular phase: Mildly increased blood flow to the RIGHT shoulder
versus LEFT

Blood pool phase: Increased blood pool surrounding RIGHT shoulder
joint and prosthesis. Minimal blood pool increase adjacent to the
superior aspect of the LEFT shoulder.

Delayed phase: Mildly increased uptake in the RIGHT shoulder region
diffusely, greatest at AC joint.No focal areas of increased tracer
localization are identified to suggest prosthetic loosening. Minimal
uptake at LEFT AC joint as well. No abnormal costal, clavicular, or
scapular localization of tracer.
IMPRESSION: Increased blood flow and blood pool to the RIGHT shoulder joint
region compatible with synovitis.

Note definite scintigraphic evidence of prosthetic loosening.

Probable mild degenerative changes of the AC joints bilaterally.

## 2017-08-30 ENCOUNTER — Ambulatory Visit (INDEPENDENT_AMBULATORY_CARE_PROVIDER_SITE_OTHER): Payer: BLUE CROSS/BLUE SHIELD | Admitting: *Deleted

## 2017-08-30 DIAGNOSIS — J309 Allergic rhinitis, unspecified: Secondary | ICD-10-CM | POA: Diagnosis not present

## 2017-09-08 DIAGNOSIS — J301 Allergic rhinitis due to pollen: Secondary | ICD-10-CM

## 2017-09-08 NOTE — Progress Notes (Signed)
Vials exp 09-09-18

## 2017-09-20 ENCOUNTER — Ambulatory Visit (INDEPENDENT_AMBULATORY_CARE_PROVIDER_SITE_OTHER): Payer: BLUE CROSS/BLUE SHIELD | Admitting: Allergy & Immunology

## 2017-09-20 ENCOUNTER — Encounter: Payer: Self-pay | Admitting: Allergy & Immunology

## 2017-09-20 ENCOUNTER — Ambulatory Visit: Payer: Self-pay | Admitting: *Deleted

## 2017-09-20 VITALS — BP 120/78 | HR 68 | Temp 97.6°F | Resp 20 | Ht 69.3 in | Wt 210.0 lb

## 2017-09-20 DIAGNOSIS — J3089 Other allergic rhinitis: Secondary | ICD-10-CM

## 2017-09-20 DIAGNOSIS — J4530 Mild persistent asthma, uncomplicated: Secondary | ICD-10-CM | POA: Diagnosis not present

## 2017-09-20 DIAGNOSIS — J309 Allergic rhinitis, unspecified: Secondary | ICD-10-CM

## 2017-09-20 MED ORDER — MONTELUKAST SODIUM 10 MG PO TABS: 10.0000 mg | ORAL_TABLET | Freq: Every day | ORAL | 5 refills | Status: DC

## 2017-09-20 MED ORDER — BECLOMETHASONE DIPROPIONATE 80 MCG/ACT IN AERS: INHALATION_SPRAY | RESPIRATORY_TRACT | 5 refills | Status: DC

## 2017-09-20 NOTE — Addendum Note (Signed)
Addended by: Horris Latino on: 09/20/2017 03:57 PM   Modules accepted: Orders

## 2017-09-20 NOTE — Progress Notes (Signed)
FOLLOW UP  Date of Service/Encounter:  09/20/17   Assessment:   Mild persistent asthma without complication  Perennial and seasonal allergic rhinitis (grasses, weeds, trees, cat, dog, molds)  Plan/Recommendations:   1. Mild persistent asthma without complication - Lung testing looked great today. - We will add on Qvar 26mcg to use only with respiratory illnesses. - Daily controller medication(s): NONE - Prior to physical activity: ProAir 2 puffs 10-15 minutes before physical activity. - Rescue medications: ProAir 4 puffs every 4-6 hours as needed - Changes during respiratory infections or worsening symptoms: Add on Qvar 54mcg to 2 puffs twice daily for ONE TO TWO WEEKS. - Asthma control goals:  * Full participation in all desired activities (may need albuterol before activity) * Albuterol use two time or less a week on average (not counting use with activity) * Cough interfering with sleep two time or less a month * Oral steroids no more than once a year * No hospitalizations  2. Perennial and seasonal allergic rhinitis - Continue with allergy shots at the same schedule. - Continue with Singulair (montelukast) as needed.  3. Return in about 1 year (around 09/21/2018).   Subjective:   Shaun Velasquez is a 57 y.o. male presenting today for follow up of  Chief Complaint  Patient presents with  . Follow-up    Shaun Velasquez has a history of the following: Patient Active Problem List   Diagnosis Date Noted  . Visit for preventive health examination 05/22/2016  . Hypogonadism in male 05/22/2016  . Bilateral shoulder pain 03/29/2016  . Perennial and seasonal allergic rhinitis 08/27/2015  . Cough variant asthma 08/27/2015  . Non morbid obesity due to excess calories 05/30/2015  . Anxiety disorder 05/19/2015  . Hyperlipidemia 05/19/2015  . Hypothyroidism 05/19/2015  . Uncontrolled diabetes mellitus type 2 without complications (Shaun Velasquez) 07/08/9483  . S/P shoulder replacement  12/27/2014    History obtained from: chart review and patient.  Quin Hoop Bula's Primary Care Provider is Brunetta Jeans, PA-C.     Shaun Velasquez is a 56 y.o. male presenting for a follow up visit.  He was last seen in an office visit in October 2017 by Dr. Verlin Fester.  At that time, his cough was improved with the use of Qvar 40 mcg 2 puffs daily, Singulair 10 mg daily, and albuterol as needed.  He does have a history of perennial and seasonal allergic rhinitis, and he was interested in starting allergen immunotherapy.  Since the last visit, he has done very well.   Allergic Rhinitis Symptom History: Shaun Velasquez is on allergen immunotherapy. He receives two injections. Immunotherapy script #1 contains molds, cat and dog. He currently receives 0.3mL of the RED vial (1/100). Immunotherapy script #2 contains trees, weeds and grasses. He currently receives 0.22mL of the RED vial (1/100). He started shots October of 2017 and reached maintenance in February of 2018. He is not taking any medications on a routine basis, but will use his Singulair during the worst times of the year. He has not required the use of any antibiotics.   Asthma/Respiratory Symptom History: He is no longer on the Qvar daily and seems rather confused when I ask about it. Typically he only has problems with viral URIs. He did need it in the spring and it started in January when he had a cold. He will cough for months at a time, using his albuterol 2-3 times daily during that time. He does not require the use of prednisone, and the  coughing eventually goes away.   Otherwise, there have been no changes to his past medical history, surgical history, family history, or social history.    Review of Systems: a 14-point review of systems is pertinent for what is mentioned in HPI.  Otherwise, all other systems were negative. Constitutional: negative other than that listed in the HPI Eyes: negative other than that listed in the HPI Ears, nose, mouth,  throat, and face: negative other than that listed in the HPI Respiratory: negative other than that listed in the HPI Cardiovascular: negative other than that listed in the HPI Gastrointestinal: negative other than that listed in the HPI Genitourinary: negative other than that listed in the HPI Integument: negative other than that listed in the HPI Hematologic: negative other than that listed in the HPI Musculoskeletal: negative other than that listed in the HPI Neurological: negative other than that listed in the HPI Allergy/Immunologic: negative other than that listed in the HPI    Objective:   Blood pressure 120/78, pulse 68, temperature 97.6 F (36.4 C), temperature source Oral, resp. rate 20, height 5' 9.3" (1.76 m), weight 210 lb (95.3 kg), SpO2 97 %. Body mass index is 30.74 kg/m.   Physical Exam:  General: Alert, interactive, in no acute distress. Pleasant male. Talkative.  Eyes: No conjunctival injection present on the right, No conjunctival injection present on the left, no discharge on the right, no discharge on the left, no Horner-Trantas dots present and allergic shiners present bilaterally. PERR bilaterally. EOMI without pain. No photophobia.  Ears: Right TM pearly gray with normal light reflex, Left TM pearly gray with normal light reflex, Right TM intact without perforation and Left TM intact without perforation.  Nose/Throat: External nose within normal limits and septum midline. Turbinates minimally edematous without discharge. Posterior oropharynx mildly erythematous without cobblestoning in the posterior oropharynx. Tonsils 2+ without exudates.  Tongue without thrush and Geographic tongue present. Lungs: Clear to auscultation without wheezing, rhonchi or rales. No increased work of breathing. CV: Normal S1/S2. No murmurs. Capillary refill <2 seconds.  Skin: Warm and dry, without lesions or rashes. Neuro:   Grossly intact. No focal deficits appreciated. Responsive to  questions.  Diagnostic studies:   Spirometry: results normal (FEV1: 3.42/97%, FVC: 4.46/96%, FEV1/FVC: 77%).    Spirometry consistent with normal pattern.   Allergy Studies: none     Salvatore Marvel, MD  [facility]

## 2017-09-20 NOTE — Patient Instructions (Addendum)
1. Mild persistent asthma without complication - Lung testing looked great today. - We will add on Qvar 12mcg to use only with respiratory illnesses. - Daily controller medication(s): NONE - Prior to physical activity: ProAir 2 puffs 10-15 minutes before physical activity. - Rescue medications: ProAir 4 puffs every 4-6 hours as needed - Changes during respiratory infections or worsening symptoms: Add on Qvar 70mcg to 2 puffs twice daily for ONE TO TWO WEEKS. - Asthma control goals:  * Full participation in all desired activities (may need albuterol before activity) * Albuterol use two time or less a week on average (not counting use with activity) * Cough interfering with sleep two time or less a month * Oral steroids no more than once a year * No hospitalizations  2. Perennial and seasonal allergic rhinitis - Continue with allergy shots at the same schedule. - Continue with Singulair (montelukast) as needed.  3. Return in about 1 year (around 09/21/2018).   Please inform us of any Emergency Department visits, hospitalizations, or changes in symptoms. Call us before going to the ED for breathing or allergy symptoms since we might be able to fit you in for a sick visit. Feel free to contact us anytime with any questions, problems, or concerns.  It was a pleasure to meet you today!  Websites that have reliable patient information: 1. American Academy of Asthma, Allergy, and Immunology: www.aaaai.org 2. Food Allergy Research and Education (FARE): foodallergy.org 3. Mothers of Asthmatics: http://www.asthmacommunitynetwork.org 4. American College of Allergy, Asthma, and Immunology: MonthlyElectricBill.co.uk   Make sure you are registered to vote! If you have moved or changed any of your contact information, you will need to get this updated before voting!

## 2017-10-01 ENCOUNTER — Other Ambulatory Visit: Payer: Self-pay | Admitting: Physician Assistant

## 2017-10-01 ENCOUNTER — Encounter: Payer: Self-pay | Admitting: Emergency Medicine

## 2017-10-01 DIAGNOSIS — E1165 Type 2 diabetes mellitus with hyperglycemia: Principal | ICD-10-CM

## 2017-10-01 DIAGNOSIS — IMO0001 Reserved for inherently not codable concepts without codable children: Secondary | ICD-10-CM

## 2017-10-01 NOTE — Telephone Encounter (Signed)
Patient due for follow up appointment for diabetes to make sure numbers are coming down

## 2017-10-02 ENCOUNTER — Other Ambulatory Visit: Payer: Self-pay | Admitting: Physician Assistant

## 2017-10-02 DIAGNOSIS — E785 Hyperlipidemia, unspecified: Secondary | ICD-10-CM

## 2017-10-04 ENCOUNTER — Other Ambulatory Visit: Payer: Self-pay

## 2017-10-04 ENCOUNTER — Encounter: Payer: Self-pay | Admitting: Emergency Medicine

## 2017-10-04 ENCOUNTER — Ambulatory Visit (INDEPENDENT_AMBULATORY_CARE_PROVIDER_SITE_OTHER): Payer: BLUE CROSS/BLUE SHIELD | Admitting: Physician Assistant

## 2017-10-04 ENCOUNTER — Encounter: Payer: Self-pay | Admitting: Physician Assistant

## 2017-10-04 ENCOUNTER — Ambulatory Visit (INDEPENDENT_AMBULATORY_CARE_PROVIDER_SITE_OTHER): Payer: BLUE CROSS/BLUE SHIELD

## 2017-10-04 VITALS — BP 102/70 | HR 68 | Temp 97.7°F | Resp 14 | Ht 69.0 in | Wt 209.0 lb

## 2017-10-04 DIAGNOSIS — Z23 Encounter for immunization: Secondary | ICD-10-CM | POA: Diagnosis not present

## 2017-10-04 DIAGNOSIS — IMO0001 Reserved for inherently not codable concepts without codable children: Secondary | ICD-10-CM

## 2017-10-04 DIAGNOSIS — E1165 Type 2 diabetes mellitus with hyperglycemia: Secondary | ICD-10-CM | POA: Diagnosis not present

## 2017-10-04 DIAGNOSIS — J309 Allergic rhinitis, unspecified: Secondary | ICD-10-CM | POA: Diagnosis not present

## 2017-10-04 DIAGNOSIS — E785 Hyperlipidemia, unspecified: Secondary | ICD-10-CM | POA: Diagnosis not present

## 2017-10-04 LAB — HEPATIC FUNCTION PANEL
ALT: 20 U/L (ref 0–53)
AST: 13 U/L (ref 0–37)
Albumin: 4.3 g/dL (ref 3.5–5.2)
Alkaline Phosphatase: 76 U/L (ref 39–117)
Bilirubin, Direct: 0.1 mg/dL (ref 0.0–0.3)
Total Bilirubin: 0.7 mg/dL (ref 0.2–1.2)
Total Protein: 7.1 g/dL (ref 6.0–8.3)

## 2017-10-04 LAB — LIPID PANEL
Cholesterol: 186 mg/dL (ref 0–200)
HDL: 46.3 mg/dL (ref >39.00)
LDL Cholesterol: 112 mg/dL — ABNORMAL HIGH (ref 0–99)
NonHDL: 139.37
Total CHOL/HDL Ratio: 4
Triglycerides: 135 mg/dL (ref 0.0–149.0)
VLDL: 27 mg/dL (ref 0.0–40.0)

## 2017-10-04 LAB — BASIC METABOLIC PANEL
BUN: 16 mg/dL (ref 6–23)
CO2: 30 mEq/L (ref 19–32)
Calcium: 9.7 mg/dL (ref 8.4–10.5)
Chloride: 97 mEq/L (ref 96–112)
Creatinine, Ser: 0.87 mg/dL (ref 0.40–1.50)
GFR: 95.65 mL/min (ref >60.00)
Glucose, Bld: 154 mg/dL — ABNORMAL HIGH (ref 70–99)
Potassium: 4.4 mEq/L (ref 3.5–5.1)
Sodium: 135 mEq/L (ref 135–145)

## 2017-10-04 LAB — POCT GLYCOSYLATED HEMOGLOBIN (HGB A1C): Hemoglobin A1C: 7.3 % — AB (ref 4.0–5.6)

## 2017-10-04 LAB — MICROALBUMIN / CREATININE URINE RATIO
Creatinine,U: 140.4 mg/dL
Microalb Creat Ratio: 0.8 mg/g (ref 0.0–30.0)
Microalb, Ur: 1.1 mg/dL (ref 0.0–1.9)

## 2017-10-04 NOTE — Assessment & Plan Note (Signed)
POC A1C at 7.3. Much improved from 12.0 at last check. Will repeat BMP today and urine microalbumin. Continue current regimen for now. Would like A1C to be < 7. Will have him add on daily OTC Cinnamon supplement. Work harder on diet and exercise. Follow-up scheduled.

## 2017-10-04 NOTE — Telephone Encounter (Signed)
Patient was seen today.  Will wait until lab results to refill or increase dosage.

## 2017-10-04 NOTE — Patient Instructions (Signed)
Please go to the lab today for blood work.  I will call you with your results. We will alter treatment regimen(s) if indicated by your results.   Start an OTC cinnamon supplement to help with glucose levels. Continue medications as directed.  Follow-up in 6 months.    Diabetes Mellitus and Exercise Exercising regularly is important for your overall health, especially when you have diabetes (diabetes mellitus). Exercising is not only about losing weight. It has many health benefits, such as increasing muscle strength and bone density and reducing body fat and stress. This leads to improved fitness, flexibility, and endurance, all of which result in better overall health. Exercise has additional benefits for people with diabetes, including:  Reducing appetite.  Helping to lower and control blood glucose.  Lowering blood pressure.  Helping to control amounts of fatty substances (lipids) in the blood, such as cholesterol and triglycerides.  Helping the body to respond better to insulin (improving insulin sensitivity).  Reducing how much insulin the body needs.  Decreasing the risk for heart disease by: ? Lowering cholesterol and triglyceride levels. ? Increasing the levels of good cholesterol. ? Lowering blood glucose levels.  What is my activity plan? Your health care provider or certified diabetes educator can help you make a plan for the type and frequency of exercise (activity plan) that works for you. Make sure that you:  Do at least 150 minutes of moderate-intensity or vigorous-intensity exercise each week. This could be brisk walking, biking, or water aerobics. ? Do stretching and strength exercises, such as yoga or weightlifting, at least 2 times a week. ? Spread out your activity over at least 3 days of the week.  Get some form of physical activity every day. ? Do not go more than 2 days in a row without some kind of physical activity. ? Avoid being inactive for more than  90 minutes at a time. Take frequent breaks to walk or stretch.  Choose a type of exercise or activity that you enjoy, and set realistic goals.  Start slowly, and gradually increase the intensity of your exercise over time.  What do I need to know about managing my diabetes?  Check your blood glucose before and after exercising. ? If your blood glucose is higher than 240 mg/dL (13.3 mmol/L) before you exercise, check your urine for ketones. If you have ketones in your urine, do not exercise until your blood glucose returns to normal.  Know the symptoms of low blood glucose (hypoglycemia) and how to treat it. Your risk for hypoglycemia increases during and after exercise. Common symptoms of hypoglycemia can include: ? Hunger. ? Anxiety. ? Sweating and feeling clammy. ? Confusion. ? Dizziness or feeling light-headed. ? Increased heart rate or palpitations. ? Blurry vision. ? Tingling or numbness around the mouth, lips, or tongue. ? Tremors or shakes. ? Irritability.  Keep a rapid-acting carbohydrate snack available before, during, and after exercise to help prevent or treat hypoglycemia.  Avoid injecting insulin into areas of the body that are going to be exercised. For example, avoid injecting insulin into: ? The arms, when playing tennis. ? The legs, when jogging.  Keep records of your exercise habits. Doing this can help you and your health care provider adjust your diabetes management plan as needed. Write down: ? Food that you eat before and after you exercise. ? Blood glucose levels before and after you exercise. ? The type and amount of exercise you have done. ? When your insulin is expected  to peak, if you use insulin. Avoid exercising at times when your insulin is peaking.  When you start a new exercise or activity, work with your health care provider to make sure the activity is safe for you, and to adjust your insulin, medicines, or food intake as needed.  Drink plenty of  water while you exercise to prevent dehydration or heat stroke. Drink enough fluid to keep your urine clear or pale yellow. This information is not intended to replace advice given to you by your health care provider. Make sure you discuss any questions you have with your health care provider. Document Released: 03/21/2003 Document Revised: 07/19/2015 Document Reviewed: 06/10/2015 Elsevier Interactive Patient Education  2018 [employer].

## 2017-10-04 NOTE — Assessment & Plan Note (Signed)
LDL extremely elevated at last check. Has since restarted statin. Will check fasting lipids and LFt today. Will adjust medication as indicated by results.

## 2017-10-04 NOTE — Progress Notes (Signed)
History of Present Illness: Patient is a 57 y.o. male who presents to clinic today for follow-up of Diabetes Mellitus II, uncontrolled.  Patient currently on medication regimen of Metformin 500 mg BID.  Is taking medications as directed. Endorses trying to keep a well-balanced carb-modified diet.  Denies polyuria, polydipsia, polyphagia, vision changes or peripheral neuropathy symptoms. Is checking blood glucose as directed. Notes fating glucose averaging around 120-140. Is also taking his Pravachol as directed. Last LDL 218.   Latest Maintenance: A1C --  Lab Results  Component Value Date   HGBA1C 7.3 (A) 10/04/2017   Diabetic Eye Exam -- up-to-date. 01/11/17 No retionpathy Urine Microalbumin -- Due Foot Exam -- up-to-date 07/02/17 Normal  Past Medical History:  Diagnosis Date  . Allergy   . Anxiety   . Arthritis   . Collapsed lung   . Diabetes mellitus without complication ([place])   . Eczema   . GERD (gastroesophageal reflux disease)   . History of chickenpox   . Hyperlipemia   . Hypothyroidism   . PONV (postoperative nausea and vomiting)   . Psoriasis     Current Outpatient Medications on File Prior to Visit  Medication Sig Dispense Refill  . Albuterol Sulfate (PROAIR RESPICLICK) 962 (90 Base) MCG/ACT AEPB Inhale 2 puffs into the lungs every 6 (six) hours as needed. 1 each 3  . beclomethasone (QVAR) 80 MCG/ACT inhaler 2 puffs twice daily for ONE TO TWO WEEKS. (Only with respiratory illnesses) 8.7 g 5  . fluticasone (FLONASE) 50 MCG/ACT nasal spray Place 2 sprays into both nostrils daily. 16 g 5  . glucose blood ([employer] TEST STRIPS) test strip Please check blood sugar once a day due to fluctuating blood sugars.    . hydrOXYzine (ATARAX/VISTARIL) 25 MG tablet TAKE 1 TABLET BY MOUTH 3 TIMES A DAY AS NEEDED 90 tablet 2  . levothyroxine (SYNTHROID, LEVOTHROID) 200 MCG tablet Take 1 tablet (200 mcg total) by mouth daily before breakfast. 30 tablet 5  . metFORMIN (GLUCOPHAGE) 500 MG  tablet TAKE 2 TABLETS BY MOUTH TWICE A DAY WITH MEALS 120 tablet 2  . montelukast (SINGULAIR) 10 MG tablet Take 1 tablet (10 mg total) by mouth at bedtime. 30 tablet 5  . Multiple Vitamin (MULTIVITAMIN WITH MINERALS) TABS tablet Take 1 tablet by mouth daily.    [name] omeprazole (PRILOSEC) 20 MG capsule Take 20 mg by mouth daily as needed (acid reflux).   5  . pravastatin (PRAVACHOL) 40 MG tablet Take 1 tablet (40 mg total) by mouth daily. 90 tablet 0   No current facility-administered medications on file prior to visit.     No Known Allergies  Family History  Problem Relation Age of Onset  . Allergic rhinitis Mother   . Lung cancer Mother   . Asthma Sister   . Cancer Sister        Skin    Social History   Socioeconomic History  . Marital status: Married    Spouse name: Not on file  . Number of children: Not on file  . Years of education: Not on file  . Highest education level: Not on file  Occupational History  . Not on file  Social Needs  . Financial resource strain: Not on file  . Food insecurity:    Worry: Not on file    Inability: Not on file  . Transportation needs:    Medical: Not on file    Non-medical: Not on file  Tobacco Use  . Smoking status: Former  Smoker    Years: 15.00    Types: Cigarettes    Last attempt to quit: 1996    Years since quitting: 23.7  . Smokeless tobacco: Former Network engineer and Sexual Activity  . Alcohol use: No  . Drug use: No  . Sexual activity: Not on file  Lifestyle  . Physical activity:    Days per week: Not on file    Minutes per session: Not on file  . Stress: Not on file  Relationships  . Social connections:    Talks on phone: Not on file    Gets together: Not on file    Attends religious service: Not on file    Active member of club or organization: Not on file    Attends meetings of clubs or organizations: Not on file    Relationship status: Not on file  Other Topics Concern  . Not on file  Social History Narrative   . Not on file   Review of Systems: Pertinent ROS are listed in HPI  Physical Examination: BP 102/70   Pulse 68   Temp 97.7 F (36.5 C) (Oral)   Resp 14   Ht 5\' 9"  (1.753 m)   Wt 209 lb (94.8 kg)   SpO2 98%   BMI 30.86 kg/m  General appearance: alert, cooperative, appears stated age and no distress Lungs: clear to auscultation bilaterally Heart: regular rate and rhythm, S1, S2 normal, no murmur, click, rub or gallop  Assessment/Plan: Uncontrolled diabetes mellitus type 2 without complications (HCC) POC A0T at 7.3. Much improved from 12.0 at last check. Will repeat BMP today and urine microalbumin. Continue current regimen for now. Would like A1C to be < 7. Will have him add on daily OTC Cinnamon supplement. Work harder on diet and exercise. Follow-up scheduled.   Hyperlipidemia LDL extremely elevated at last check. Has since restarted statin. Will check fasting lipids and LFt today. Will adjust medication as indicated by results.

## 2017-10-06 ENCOUNTER — Other Ambulatory Visit: Payer: Self-pay | Admitting: Physician Assistant

## 2017-10-06 DIAGNOSIS — E785 Hyperlipidemia, unspecified: Secondary | ICD-10-CM

## 2017-10-06 MED ORDER — PRAVASTATIN SODIUM 80 MG PO TABS: 80.0000 mg | ORAL_TABLET | Freq: Every day | ORAL | 1 refills | Status: DC

## 2017-10-19 ENCOUNTER — Ambulatory Visit (INDEPENDENT_AMBULATORY_CARE_PROVIDER_SITE_OTHER): Payer: BLUE CROSS/BLUE SHIELD | Admitting: *Deleted

## 2017-10-19 DIAGNOSIS — J309 Allergic rhinitis, unspecified: Secondary | ICD-10-CM | POA: Diagnosis not present

## 2017-10-25 ENCOUNTER — Ambulatory Visit (INDEPENDENT_AMBULATORY_CARE_PROVIDER_SITE_OTHER): Payer: BLUE CROSS/BLUE SHIELD | Admitting: *Deleted

## 2017-10-25 DIAGNOSIS — J309 Allergic rhinitis, unspecified: Secondary | ICD-10-CM

## 2017-11-01 ENCOUNTER — Ambulatory Visit (INDEPENDENT_AMBULATORY_CARE_PROVIDER_SITE_OTHER): Payer: BLUE CROSS/BLUE SHIELD

## 2017-11-01 DIAGNOSIS — J309 Allergic rhinitis, unspecified: Secondary | ICD-10-CM | POA: Diagnosis not present

## 2017-11-12 ENCOUNTER — Ambulatory Visit (INDEPENDENT_AMBULATORY_CARE_PROVIDER_SITE_OTHER): Payer: BLUE CROSS/BLUE SHIELD | Admitting: *Deleted

## 2017-11-12 DIAGNOSIS — J309 Allergic rhinitis, unspecified: Secondary | ICD-10-CM | POA: Diagnosis not present

## 2017-11-22 ENCOUNTER — Ambulatory Visit (INDEPENDENT_AMBULATORY_CARE_PROVIDER_SITE_OTHER): Payer: BLUE CROSS/BLUE SHIELD | Admitting: *Deleted

## 2017-11-22 DIAGNOSIS — J309 Allergic rhinitis, unspecified: Secondary | ICD-10-CM

## 2017-12-13 ENCOUNTER — Ambulatory Visit (INDEPENDENT_AMBULATORY_CARE_PROVIDER_SITE_OTHER): Payer: BLUE CROSS/BLUE SHIELD | Admitting: *Deleted

## 2017-12-13 DIAGNOSIS — J309 Allergic rhinitis, unspecified: Secondary | ICD-10-CM

## 2017-12-16 ENCOUNTER — Telehealth: Payer: Self-pay | Admitting: General Practice

## 2017-12-16 ENCOUNTER — Encounter: Payer: Self-pay | Admitting: General Practice

## 2017-12-16 NOTE — Telephone Encounter (Signed)
-----   Message from Brunetta Jeans, PA-C sent at 12/12/2017 11:06 AM EST ----- Patient overdue for repeat labs. Please message to remind and schedule. ----- Message ----- From: SYSTEM Sent: 12/11/2017  12:09 AM EST To: Brunetta Jeans, PA-C

## 2017-12-16 NOTE — Progress Notes (Signed)
Sent pt a mychart message to schedule a lab only appt.

## 2017-12-16 NOTE — Telephone Encounter (Signed)
Sent pt a mychart message to advise of PCP notes of being overdue for a lab only appointment. Labs are ordered int he system.   [place] [facility] to Discuss results / PCP recommendations / Schedule patient.

## 2018-01-03 ENCOUNTER — Ambulatory Visit (INDEPENDENT_AMBULATORY_CARE_PROVIDER_SITE_OTHER): Payer: BLUE CROSS/BLUE SHIELD | Admitting: *Deleted

## 2018-01-03 DIAGNOSIS — J309 Allergic rhinitis, unspecified: Secondary | ICD-10-CM | POA: Diagnosis not present

## 2018-01-11 LAB — HM DIABETES EYE EXAM

## 2018-01-13 ENCOUNTER — Encounter: Payer: Self-pay | Admitting: Emergency Medicine

## 2018-01-24 ENCOUNTER — Ambulatory Visit (INDEPENDENT_AMBULATORY_CARE_PROVIDER_SITE_OTHER): Payer: BLUE CROSS/BLUE SHIELD

## 2018-01-24 DIAGNOSIS — J309 Allergic rhinitis, unspecified: Secondary | ICD-10-CM | POA: Diagnosis not present

## 2018-02-01 NOTE — Progress Notes (Signed)
EXP 02/02/19

## 2018-02-02 DIAGNOSIS — J301 Allergic rhinitis due to pollen: Secondary | ICD-10-CM

## 2018-02-11 ENCOUNTER — Other Ambulatory Visit: Payer: Self-pay | Admitting: Physician Assistant

## 2018-02-11 DIAGNOSIS — E039 Hypothyroidism, unspecified: Secondary | ICD-10-CM

## 2018-02-14 ENCOUNTER — Ambulatory Visit (INDEPENDENT_AMBULATORY_CARE_PROVIDER_SITE_OTHER): Payer: BLUE CROSS/BLUE SHIELD | Admitting: *Deleted

## 2018-02-14 DIAGNOSIS — J309 Allergic rhinitis, unspecified: Secondary | ICD-10-CM | POA: Diagnosis not present

## 2018-02-24 ENCOUNTER — Other Ambulatory Visit: Payer: Self-pay | Admitting: Physician Assistant

## 2018-02-24 DIAGNOSIS — E1165 Type 2 diabetes mellitus with hyperglycemia: Principal | ICD-10-CM

## 2018-02-24 DIAGNOSIS — IMO0001 Reserved for inherently not codable concepts without codable children: Secondary | ICD-10-CM

## 2018-03-11 ENCOUNTER — Ambulatory Visit (INDEPENDENT_AMBULATORY_CARE_PROVIDER_SITE_OTHER): Payer: BLUE CROSS/BLUE SHIELD | Admitting: *Deleted

## 2018-03-11 DIAGNOSIS — J309 Allergic rhinitis, unspecified: Secondary | ICD-10-CM

## 2018-03-22 ENCOUNTER — Encounter: Payer: Self-pay | Admitting: Physician Assistant

## 2018-03-22 ENCOUNTER — Ambulatory Visit (INDEPENDENT_AMBULATORY_CARE_PROVIDER_SITE_OTHER): Payer: BLUE CROSS/BLUE SHIELD | Admitting: Physician Assistant

## 2018-03-22 ENCOUNTER — Other Ambulatory Visit: Payer: Self-pay

## 2018-03-22 VITALS — BP 110/80 | HR 75 | Temp 97.8°F | Resp 14 | Ht 69.0 in | Wt 214.0 lb

## 2018-03-22 DIAGNOSIS — Z0000 Encounter for general adult medical examination without abnormal findings: Secondary | ICD-10-CM | POA: Diagnosis not present

## 2018-03-22 DIAGNOSIS — E039 Hypothyroidism, unspecified: Secondary | ICD-10-CM

## 2018-03-22 DIAGNOSIS — Z125 Encounter for screening for malignant neoplasm of prostate: Secondary | ICD-10-CM

## 2018-03-22 DIAGNOSIS — J0100 Acute maxillary sinusitis, unspecified: Secondary | ICD-10-CM

## 2018-03-22 DIAGNOSIS — E785 Hyperlipidemia, unspecified: Secondary | ICD-10-CM | POA: Diagnosis not present

## 2018-03-22 DIAGNOSIS — E119 Type 2 diabetes mellitus without complications: Secondary | ICD-10-CM

## 2018-03-22 LAB — COMPREHENSIVE METABOLIC PANEL
ALT: 33 U/L (ref 0–53)
AST: 18 U/L (ref 0–37)
Albumin: 4.2 g/dL (ref 3.5–5.2)
Alkaline Phosphatase: 86 U/L (ref 39–117)
BUN: 16 mg/dL (ref 6–23)
CO2: 30 mEq/L (ref 19–32)
Calcium: 9.5 mg/dL (ref 8.4–10.5)
Chloride: 95 mEq/L — ABNORMAL LOW (ref 96–112)
Creatinine, Ser: 0.85 mg/dL (ref 0.40–1.50)
GFR: 92.29 mL/min (ref >60.00)
Glucose, Bld: 205 mg/dL — ABNORMAL HIGH (ref 70–99)
Potassium: 4.9 mEq/L (ref 3.5–5.1)
Sodium: 134 mEq/L — ABNORMAL LOW (ref 135–145)
Total Bilirubin: 0.6 mg/dL (ref 0.2–1.2)
Total Protein: 6.9 g/dL (ref 6.0–8.3)

## 2018-03-22 LAB — LIPID PANEL
Cholesterol: 189 mg/dL (ref 0–200)
HDL: 46.1 mg/dL (ref >39.00)
LDL Cholesterol: 119 mg/dL — ABNORMAL HIGH (ref 0–99)
NonHDL: 142.86
Total CHOL/HDL Ratio: 4
Triglycerides: 117 mg/dL (ref 0.0–149.0)
VLDL: 23.4 mg/dL (ref 0.0–40.0)

## 2018-03-22 LAB — CBC WITH DIFFERENTIAL/PLATELET
Basophils Absolute: 0.1 10*3/uL (ref 0.0–0.1)
Basophils Relative: 0.9 % (ref 0.0–3.0)
Eosinophils Absolute: 0.2 10*3/uL (ref 0.0–0.7)
Eosinophils Relative: 2.6 % (ref 0.0–5.0)
HCT: 44 % (ref 39.0–52.0)
Hemoglobin: 14.9 g/dL (ref 13.0–17.0)
Lymphocytes Relative: 24.6 % (ref 12.0–46.0)
Lymphs Abs: 2.2 10*3/uL (ref 0.7–4.0)
MCHC: 33.9 g/dL (ref 30.0–36.0)
MCV: 83.9 fl (ref 78.0–100.0)
Monocytes Absolute: 0.6 10*3/uL (ref 0.1–1.0)
Monocytes Relative: 7 % (ref 3.0–12.0)
Neutro Abs: 5.9 10*3/uL (ref 1.4–7.7)
Neutrophils Relative %: 64.9 % (ref 43.0–77.0)
Platelets: 291 10*3/uL (ref 150.0–400.0)
RBC: 5.25 Mil/uL (ref 4.22–5.81)
RDW: 13.3 % (ref 11.5–15.5)
WBC: 9 10*3/uL (ref 4.0–10.5)

## 2018-03-22 LAB — HEMOGLOBIN A1C: Hgb A1c MFr Bld: 8.6 % — ABNORMAL HIGH (ref 4.6–6.5)

## 2018-03-22 LAB — PSA: PSA: 1.15 ng/mL (ref 0.10–4.00)

## 2018-03-22 LAB — TSH: TSH: 0.23 u[IU]/mL — ABNORMAL LOW (ref 0.35–4.50)

## 2018-03-22 MED ORDER — AMOXICILLIN-POT CLAVULANATE 875-125 MG PO TABS: 1.0000 | ORAL_TABLET | Freq: Two times a day (BID) | ORAL | 0 refills | Status: DC

## 2018-03-22 NOTE — Patient Instructions (Addendum)
-Please go to the lab for blood work.  -Our office will call you with your results unless you have chosen to receive results via MyChart. -If your blood work is normal we will follow-up each year for physicals and as scheduled for chronic medical problems. -If anything is abnormal we will treat accordingly and get you in for a follow-up.   Please take antibiotic as directed.  Increase fluid intake.  Use Saline nasal spray.  Take a daily multivitamin. Continue allergy medications.  Place a humidifier in the bedroom.  Please call or return clinic if symptoms are not improving.  Sinusitis Sinusitis is redness, soreness, and swelling (inflammation) of the paranasal sinuses. Paranasal sinuses are air pockets within the bones of your face (beneath the eyes, the middle of the forehead, or above the eyes). In healthy paranasal sinuses, mucus is able to drain out, and air is able to circulate through them by way of your nose. However, when your paranasal sinuses are inflamed, mucus and air can become trapped. This can allow bacteria and other germs to grow and cause infection. Sinusitis can develop quickly and last only a short time (acute) or continue over a long period (chronic). Sinusitis that lasts for more than 12 weeks is considered chronic.  CAUSES  Causes of sinusitis include:  Allergies.  Structural abnormalities, such as displacement of the cartilage that separates your nostrils (deviated septum), which can decrease the air flow through your nose and sinuses and affect sinus drainage.  Functional abnormalities, such as when the small hairs (cilia) that line your sinuses and help remove mucus do not work properly or are not present. SYMPTOMS  Symptoms of acute and chronic sinusitis are the same. The primary symptoms are pain and pressure around the affected sinuses. Other symptoms include:  Upper toothache.  Earache.  Headache.  Bad breath.  Decreased sense of smell and taste.  A  cough, which worsens when you are lying flat.  Fatigue.  Fever.  Thick drainage from your nose, which often is green and may contain pus (purulent).  Swelling and warmth over the affected sinuses. DIAGNOSIS  Your caregiver will perform a physical exam. During the exam, your caregiver may:  Look in your nose for signs of abnormal growths in your nostrils (nasal polyps).  Tap over the affected sinus to check for signs of infection.  View the inside of your sinuses (endoscopy) with a special imaging device with a light attached (endoscope), which is inserted into your sinuses. If your caregiver suspects that you have chronic sinusitis, one or more of the following tests may be recommended:  Allergy tests.  Nasal culture A sample of mucus is taken from your nose and sent to a lab and screened for bacteria.  Nasal cytology A sample of mucus is taken from your nose and examined by your caregiver to determine if your sinusitis is related to an allergy. TREATMENT  Most cases of acute sinusitis are related to a viral infection and will resolve on their own within 10 days. Sometimes medicines are prescribed to help relieve symptoms (pain medicine, decongestants, nasal steroid sprays, or saline sprays).  However, for sinusitis related to a bacterial infection, your caregiver will prescribe antibiotic medicines. These are medicines that will help kill the bacteria causing the infection.  Rarely, sinusitis is caused by a fungal infection. In theses cases, your caregiver will prescribe antifungal medicine. For some cases of chronic sinusitis, surgery is needed. Generally, these are cases in which sinusitis recurs more than  3 times per year, despite other treatments. HOME CARE INSTRUCTIONS   Drink plenty of water. Water helps thin the mucus so your sinuses can drain more easily.  Use a humidifier.  Inhale steam 3 to 4 times a day (for example, sit in the bathroom with the shower  running).  Apply a warm, moist washcloth to your face 3 to 4 times a day, or as directed by your caregiver.  Use saline nasal sprays to help moisten and clean your sinuses.  Take over-the-counter or prescription medicines for pain, discomfort, or fever only as directed by your caregiver. SEEK IMMEDIATE MEDICAL CARE IF:  You have increasing pain or severe headaches.  You have nausea, vomiting, or drowsiness.  You have swelling around your face.  You have vision problems.  You have a stiff neck.  You have difficulty breathing. MAKE SURE YOU:   Understand these instructions.  Will watch your condition.  Will get help right away if you are not doing well or get worse. Document Released: 12/29/2004 Document Revised: 03/23/2011 Document Reviewed: 01/13/2011 [facility] Patient Information 2014 [place], [place].     Preventive Care 40-64 Years, Male Preventive care refers to lifestyle choices and visits with your health care provider that can promote health and wellness. What does preventive care include?   A yearly physical exam. This is also called an annual well check.  Dental exams once or twice a year.  Routine eye exams. Ask your health care provider how often you should have your eyes checked.  Personal lifestyle choices, including: ? Daily care of your teeth and gums. ? Regular physical activity. ? Eating a healthy diet. ? Avoiding tobacco and drug use. ? Limiting alcohol use. ? Practicing safe sex. ? Taking low-dose aspirin every day starting at age 80. What happens during an annual well check? The services and screenings done by your health care provider during your annual well check will depend on your age, overall health, lifestyle risk factors, and family history of disease. Counseling Your health care provider may ask you questions about your:  Alcohol use.  Tobacco use.  Drug use.  Emotional well-being.  Home and relationship well-being.  Sexual  activity.  Eating habits.  Work and work Statistician. Screening You may have the following tests or measurements:  Height, weight, and BMI.  Blood pressure.  Lipid and cholesterol levels. These may be checked every 5 years, or more frequently if you are over 19 years old.  Skin check.  Lung cancer screening. You may have this screening every year starting at age 53 if you have a 30-pack-year history of smoking and currently smoke or have quit within the past 15 years.  Colorectal cancer screening. All adults should have this screening starting at age 77 and continuing until age 48. Your health care provider may recommend screening at age 32. You will have tests every 1-10 years, depending on your results and the type of screening test. People at increased risk should start screening at an earlier age. Screening tests may include: ? Guaiac-based fecal occult blood testing. ? Fecal immunochemical test (FIT). ? Stool DNA test. ? Virtual colonoscopy. ? Sigmoidoscopy. During this test, a flexible tube with a tiny camera (sigmoidoscope) is used to examine your rectum and lower colon. The sigmoidoscope is inserted through your anus into your rectum and lower colon. ? Colonoscopy. During this test, a long, thin, flexible tube with a tiny camera (colonoscope) is used to examine your entire colon and rectum.  Prostate cancer  screening. Recommendations will vary depending on your family history and other risks.  Hepatitis C blood test.  Hepatitis B blood test.  Sexually transmitted disease (STD) testing.  Diabetes screening. This is done by checking your blood sugar (glucose) after you have not eaten for a while (fasting). You may have this done every 1-3 years. Discuss your test results, treatment options, and if necessary, the need for more tests with your health care provider. Vaccines Your health care provider may recommend certain vaccines, such as:  Influenza vaccine. This is  recommended every year.  Tetanus, diphtheria, and acellular pertussis (Tdap, Td) vaccine. You may need a Td booster every 10 years.  Varicella vaccine. You may need this if you have not been vaccinated.  Zoster vaccine. You may need this after age 61.  Measles, mumps, and rubella (MMR) vaccine. You may need at least one dose of MMR if you were born in 1957 or later. You may also need a second dose.  Pneumococcal 13-valent conjugate (PCV13) vaccine. You may need this if you have certain conditions and have not been vaccinated.  Pneumococcal polysaccharide (PPSV23) vaccine. You may need one or two doses if you smoke cigarettes or if you have certain conditions.  Meningococcal vaccine. You may need this if you have certain conditions.  Hepatitis A vaccine. You may need this if you have certain conditions or if you travel or work in places where you may be exposed to hepatitis A.  Hepatitis B vaccine. You may need this if you have certain conditions or if you travel or work in places where you may be exposed to hepatitis B.  Haemophilus influenzae type b (Hib) vaccine. You may need this if you have certain risk factors. Talk to your health care provider about which screenings and vaccines you need and how often you need them. This information is not intended to replace advice given to you by your health care provider. Make sure you discuss any questions you have with your health care provider. Document Released: 01/25/2015 Document Revised: 02/18/2017 Document Reviewed: 10/30/2014 Elsevier Interactive Patient Education  2019 [employer].

## 2018-03-22 NOTE — Progress Notes (Signed)
Patient presents to clinic today for annual exam.  Patient is fasting for labs.  Diet -- Endorses well-balanced diet overall. Exercise -- 35-40 minutes 2-3 x week of mostly cardio but some resistance training.   Acute Concerns: Patient endorses nasal congestion sinus maxillary sinus pain x 10+ days. Notes symptoms started as a mild cold -- mild chest congestion and cough with rhinorrhea. Denies tooth pain. Notes some ear pain. Denies chest pain or SOB. Denies fever.  Has been taking Nyquil and using his regular allergy medications.   Chronic Issues: Diabetes Mellitus II -- Controlled. Without complication. Is currently on a regimen of Metformin 1000 mg BID. Endorses taking medications as directed. Denies polydipsia, polyuria or polyphagia. Denies vision changes. Denies numbness or tingling. Eye exam, foot exam and immunizations are up-to-date.   Hypothyroidism -- Is currently on a regimen of levothyroxine 200 mcg daily. Endorses taking as directed. Notes stools stable. Denies cold/heat intolerance. Denies change to heat or energy levels.  Health Maintenance: Immunizations -- up-to-date Colonoscopy -- up-to-date.  Past Medical History:  Diagnosis Date  . Allergy   . Anxiety   . Arthritis   . Collapsed lung   . Diabetes mellitus without complication ([place])   . Eczema   . GERD (gastroesophageal reflux disease)   . History of chickenpox   . Hyperlipemia   . Hypothyroidism   . PONV (postoperative nausea and vomiting)   . Psoriasis   . Uncontrolled diabetes mellitus type 2 without complications ([place]) 04/20/7024    Past Surgical History:  Procedure Laterality Date  . CHEST TUBE INSERTION     pt had collapsed lung  . COLONOSCOPY W/ POLYPECTOMY    . ELBOW SURGERY Right 2009  . INCISION AND DRAINAGE PERIRECTAL ABSCESS    . REVERSE SHOULDER ARTHROPLASTY Right 12/27/2014   Procedure: RIGHT REVERSE SHOULDER ARTHROPLASTY;  Surgeon: Justice Britain, MD;  Location: [facility];  Service:  Orthopedics;  Laterality: Right;  . ROTATOR CUFF REPAIR Right 2011  . SHOULDER ARTHROSCOPY W/ LABRAL REPAIR Right   . SHOULDER ARTHROSCOPY WITH ROTATOR CUFF REPAIR Left 2003  . SHOULDER ARTHROSCOPY WITH SUBACROMIAL DECOMPRESSION Left     Current Outpatient Medications on File Prior to Visit  Medication Sig Dispense Refill  . Albuterol Sulfate (PROAIR RESPICLICK) 378 (90 Base) MCG/ACT AEPB Inhale 2 puffs into the lungs every 6 (six) hours as needed. 1 each 3  . beclomethasone (QVAR) 80 MCG/ACT inhaler 2 puffs twice daily for ONE TO TWO WEEKS. (Only with respiratory illnesses) 8.7 g 5  . fluticasone (FLONASE) 50 MCG/ACT nasal spray Place 2 sprays into both nostrils daily. 16 g 5  . glucose blood ([employer] TEST STRIPS) test strip Please check blood sugar once a day due to fluctuating blood sugars.    . hydrOXYzine (ATARAX/VISTARIL) 25 MG tablet TAKE 1 TABLET BY MOUTH 3 TIMES A DAY AS NEEDED 90 tablet 2  . levothyroxine (SYNTHROID, LEVOTHROID) 200 MCG tablet TAKE 1 TABLET (200 MCG TOTAL) BY MOUTH DAILY BEFORE BREAKFAST. 30 tablet 5  . metFORMIN (GLUCOPHAGE) 500 MG tablet TAKE 2 TABLETS POP TWICE A DAY WITH MEALS 120 tablet 1  . montelukast (SINGULAIR) 10 MG tablet Take 1 tablet (10 mg total) by mouth at bedtime. 30 tablet 5  . Multiple Vitamin (MULTIVITAMIN WITH MINERALS) TABS tablet Take 1 tablet by mouth daily.    [name] omeprazole (PRILOSEC) 20 MG capsule Take 20 mg by mouth daily as needed (acid reflux).   5  . pravastatin (PRAVACHOL) 80 MG tablet  Take 1 tablet (80 mg total) by mouth daily. 90 tablet 1   No current facility-administered medications on file prior to visit.     No Known Allergies  Family History  Problem Relation Age of Onset  . Allergic rhinitis Mother   . Lung cancer Mother   . Asthma Sister   . Cancer Sister        Skin    Social History   Socioeconomic History  . Marital status: Married    Spouse name: Not on file  . Number of children: Not on file  . Years of  education: Not on file  . Highest education level: Not on file  Occupational History  . Not on file  Social Needs  . Financial resource strain: Not on file  . Food insecurity:    Worry: Not on file    Inability: Not on file  . Transportation needs:    Medical: Not on file    Non-medical: Not on file  Tobacco Use  . Smoking status: Former Smoker    Years: 15.00    Types: Cigarettes    Last attempt to quit: 1996    Years since quitting: 24.2  . Smokeless tobacco: Former Network engineer and Sexual Activity  . Alcohol use: No  . Drug use: No  . Sexual activity: Not on file  Lifestyle  . Physical activity:    Days per week: Not on file    Minutes per session: Not on file  . Stress: Not on file  Relationships  . Social connections:    Talks on phone: Not on file    Gets together: Not on file    Attends religious service: Not on file    Active member of club or organization: Not on file    Attends meetings of clubs or organizations: Not on file    Relationship status: Not on file  . Intimate partner violence:    Fear of current or ex partner: Not on file    Emotionally abused: Not on file    Physically abused: Not on file    Forced sexual activity: Not on file  Other Topics Concern  . Not on file  Social History Narrative  . Not on file   Review of Systems  Constitutional: Negative for fever and weight loss.  HENT: Positive for congestion and sinus pain. Negative for ear discharge, ear pain, hearing loss and tinnitus.   Eyes: Negative for blurred vision, double vision, photophobia and pain.  Respiratory: Positive for cough. Negative for sputum production and shortness of breath.   Cardiovascular: Negative for chest pain and palpitations.  Gastrointestinal: Negative for abdominal pain, blood in stool, constipation, diarrhea, heartburn, melena, nausea and vomiting.  Genitourinary: Negative for dysuria, flank pain, frequency, hematuria and urgency.  Musculoskeletal:  Negative for falls.  Neurological: Negative for dizziness, loss of consciousness and headaches.  Endo/Heme/Allergies: Negative for environmental allergies.  Psychiatric/Behavioral: Negative for depression, hallucinations, substance abuse and suicidal ideas. The patient is not nervous/anxious and does not have insomnia.    BP 110/80   Pulse 75   Temp 97.8 F (36.6 C) (Oral)   Resp 14   Ht 5\' 9"  (1.753 m)   Wt 214 lb (97.1 kg)   SpO2 98%   BMI 31.60 kg/m   Physical Exam Vitals signs reviewed.  Constitutional:      General: He is not in acute distress.    Appearance: He is well-developed. He is not diaphoretic.  HENT:  Head: Normocephalic and atraumatic.     Right Ear: Tympanic membrane, ear canal and external ear normal.     Left Ear: Tympanic membrane, ear canal and external ear normal.     Nose:     Right Turbinates: Enlarged and swollen.     Left Turbinates: Enlarged and swollen.     Right Sinus: Maxillary sinus tenderness present. No frontal sinus tenderness.     Left Sinus: Maxillary sinus tenderness present. No frontal sinus tenderness.     Mouth/Throat:     Pharynx: No posterior oropharyngeal erythema.  Eyes:     Conjunctiva/sclera: Conjunctivae normal.     Pupils: Pupils are equal, round, and reactive to light.  Neck:     Musculoskeletal: Neck supple.     Thyroid: No thyromegaly.  Cardiovascular:     Rate and Rhythm: Normal rate and regular rhythm.     Heart sounds: Normal heart sounds.  Pulmonary:     Effort: Pulmonary effort is normal. No respiratory distress.     Breath sounds: Normal breath sounds. No wheezing or rales.  Chest:     Chest wall: No tenderness.  Abdominal:     General: Bowel sounds are normal. There is no distension.     Palpations: Abdomen is soft. There is no mass.     Tenderness: There is no abdominal tenderness. There is no guarding or rebound.  Lymphadenopathy:     Cervical: No cervical adenopathy.  Skin:    General: Skin is warm  and dry.     Findings: No rash.  Neurological:     Mental Status: He is alert and oriented to person, place, and time.     Cranial Nerves: No cranial nerve deficit.    Recent Results (from the past 2160 hour(s))  HM DIABETES EYE EXAM     Status: None   Collection Time: 01/11/18 12:00 AM  Result Value Ref Range   HM Diabetic Eye Exam No Retinopathy No Retinopathy    Assessment/Plan: Visit for preventive health examination Depression screen negative. Health Maintenance reviewed. Preventive schedule discussed and handout given in AVS. Will obtain fasting labs today.   Hyperlipidemia Continue working hard on diet and exercise.  Recheck fasting lipid panel and LFTs.  Hypothyroidism Taking medications as directed. Recheck TSH today.  Diabetes mellitus type II, controlled, with no complications ([place]) HM parameters up-to-date. Taking medications as directed. Recheck lab panel today.  Acute non-recurrent maxillary sinusitis Rx Augmentin.  Increase fluids.  Rest.  Saline nasal spray.  Probiotic.  Mucinex as directed.  Humidifier in bedroom. Continue allergy symptoms.  Call or return to clinic if symptoms are not improving.   Prostate cancer screening The natural history of prostate cancer and ongoing controversy regarding screening and potential treatment outcomes of prostate cancer has been discussed with the patient. The meaning of a false positive PSA and a false negative PSA has been discussed. He indicates understanding of the limitations of this screening test and wishes to proceed with screening PSA testing.     Leeanne Rio, PA-C

## 2018-03-22 NOTE — Assessment & Plan Note (Signed)
HM parameters up-to-date. Taking medications as directed. Recheck lab panel today.

## 2018-03-22 NOTE — Assessment & Plan Note (Signed)
Taking medications as directed. Recheck TSH today.

## 2018-03-22 NOTE — Assessment & Plan Note (Signed)
Depression screen negative. Health Maintenance reviewed. Preventive schedule discussed and handout given in AVS. Will obtain fasting labs today.  

## 2018-03-22 NOTE — Assessment & Plan Note (Signed)
The natural history of prostate cancer and ongoing controversy regarding screening and potential treatment outcomes of prostate cancer has been discussed with the patient. The meaning of a false positive PSA and a false negative PSA has been discussed. He indicates understanding of the limitations of this screening test and wishes  to proceed with screening PSA testing.  

## 2018-03-22 NOTE — Assessment & Plan Note (Signed)
Continue working hard on diet and exercise.  Recheck fasting lipid panel and LFTs.

## 2018-03-22 NOTE — Assessment & Plan Note (Signed)
Rx Augmentin.  Increase fluids.  Rest.  Saline nasal spray.  Probiotic.  Mucinex as directed.  Humidifier in bedroom. Continue allergy symptoms.  Call or return to clinic if symptoms are not improving.

## 2018-03-25 ENCOUNTER — Other Ambulatory Visit: Payer: Self-pay | Admitting: General Practice

## 2018-03-25 DIAGNOSIS — E039 Hypothyroidism, unspecified: Secondary | ICD-10-CM

## 2018-03-25 MED ORDER — LEVOTHYROXINE SODIUM 175 MCG PO TABS: 175.0000 ug | ORAL_TABLET | Freq: Every day | ORAL | 3 refills | Status: DC

## 2018-03-25 MED ORDER — DAPAGLIFLOZIN PROPANEDIOL 5 MG PO TABS: 5.0000 mg | ORAL_TABLET | Freq: Every day | ORAL | 3 refills | Status: DC

## 2018-03-30 ENCOUNTER — Encounter: Payer: Self-pay | Admitting: Emergency Medicine

## 2018-03-30 ENCOUNTER — Telehealth: Payer: Self-pay | Admitting: Emergency Medicine

## 2018-03-30 DIAGNOSIS — E119 Type 2 diabetes mellitus without complications: Secondary | ICD-10-CM

## 2018-03-30 MED ORDER — EMPAGLIFLOZIN 10 MG PO TABS: 10.0000 mg | ORAL_TABLET | Freq: Every day | ORAL | 2 refills | Status: DC

## 2018-03-30 NOTE — Telephone Encounter (Signed)
Spoke with patient about the [place]. Patient states the pharmacy advised patient that the Wilder Glade is not on his formulary. Will cost $150 out of pocket. Approved formulary medications were Jardiance and Invokana.  Please advise

## 2018-03-30 NOTE — Telephone Encounter (Signed)
Ok to switch to [employer] 10 mg daily instead. Follow-up 1 month for reassessment.

## 2018-03-30 NOTE — Telephone Encounter (Signed)
Rx sent to the patient's pharmacy. My chart message sent to patient advising to schedule a follow up in 1 month for DM

## 2018-04-04 ENCOUNTER — Ambulatory Visit (INDEPENDENT_AMBULATORY_CARE_PROVIDER_SITE_OTHER): Payer: BLUE CROSS/BLUE SHIELD | Admitting: *Deleted

## 2018-04-04 DIAGNOSIS — J309 Allergic rhinitis, unspecified: Secondary | ICD-10-CM

## 2018-04-28 ENCOUNTER — Other Ambulatory Visit: Payer: Self-pay | Admitting: Physician Assistant

## 2018-04-28 DIAGNOSIS — E1165 Type 2 diabetes mellitus with hyperglycemia: Principal | ICD-10-CM

## 2018-04-28 DIAGNOSIS — IMO0001 Reserved for inherently not codable concepts without codable children: Secondary | ICD-10-CM

## 2018-04-29 ENCOUNTER — Encounter: Payer: Self-pay | Admitting: Physician Assistant

## 2018-04-29 ENCOUNTER — Ambulatory Visit (INDEPENDENT_AMBULATORY_CARE_PROVIDER_SITE_OTHER): Payer: BLUE CROSS/BLUE SHIELD | Admitting: *Deleted

## 2018-04-29 DIAGNOSIS — J309 Allergic rhinitis, unspecified: Secondary | ICD-10-CM

## 2018-05-06 ENCOUNTER — Ambulatory Visit (INDEPENDENT_AMBULATORY_CARE_PROVIDER_SITE_OTHER): Payer: BLUE CROSS/BLUE SHIELD | Admitting: *Deleted

## 2018-05-06 DIAGNOSIS — J309 Allergic rhinitis, unspecified: Secondary | ICD-10-CM | POA: Diagnosis not present

## 2018-05-13 ENCOUNTER — Ambulatory Visit (INDEPENDENT_AMBULATORY_CARE_PROVIDER_SITE_OTHER): Payer: BLUE CROSS/BLUE SHIELD | Admitting: *Deleted

## 2018-05-13 DIAGNOSIS — J309 Allergic rhinitis, unspecified: Secondary | ICD-10-CM | POA: Diagnosis not present

## 2018-05-20 ENCOUNTER — Ambulatory Visit (INDEPENDENT_AMBULATORY_CARE_PROVIDER_SITE_OTHER): Payer: BLUE CROSS/BLUE SHIELD | Admitting: *Deleted

## 2018-05-20 DIAGNOSIS — J309 Allergic rhinitis, unspecified: Secondary | ICD-10-CM | POA: Diagnosis not present

## 2018-05-28 ENCOUNTER — Other Ambulatory Visit: Payer: Self-pay | Admitting: Physician Assistant

## 2018-05-28 DIAGNOSIS — E785 Hyperlipidemia, unspecified: Secondary | ICD-10-CM

## 2018-06-01 ENCOUNTER — Ambulatory Visit (INDEPENDENT_AMBULATORY_CARE_PROVIDER_SITE_OTHER): Payer: BLUE CROSS/BLUE SHIELD

## 2018-06-01 DIAGNOSIS — J309 Allergic rhinitis, unspecified: Secondary | ICD-10-CM

## 2018-06-23 ENCOUNTER — Ambulatory Visit (INDEPENDENT_AMBULATORY_CARE_PROVIDER_SITE_OTHER): Payer: BLUE CROSS/BLUE SHIELD | Admitting: *Deleted

## 2018-06-23 DIAGNOSIS — J309 Allergic rhinitis, unspecified: Secondary | ICD-10-CM | POA: Diagnosis not present

## 2018-06-27 ENCOUNTER — Other Ambulatory Visit: Payer: Self-pay | Admitting: Physician Assistant

## 2018-06-27 ENCOUNTER — Encounter: Payer: Self-pay | Admitting: Emergency Medicine

## 2018-06-27 DIAGNOSIS — E119 Type 2 diabetes mellitus without complications: Secondary | ICD-10-CM

## 2018-06-27 NOTE — Telephone Encounter (Signed)
My chart message sent to patient to schedule a follow up appointment with PCP

## 2018-06-27 NOTE — Telephone Encounter (Signed)
Patient due for OV. Please contact to schedule.

## 2018-07-04 ENCOUNTER — Ambulatory Visit (INDEPENDENT_AMBULATORY_CARE_PROVIDER_SITE_OTHER): Payer: BC Managed Care – PPO | Admitting: Physician Assistant

## 2018-07-04 ENCOUNTER — Other Ambulatory Visit: Payer: Self-pay

## 2018-07-04 ENCOUNTER — Encounter: Payer: Self-pay | Admitting: Physician Assistant

## 2018-07-04 VITALS — Temp 97.9°F | Resp 16 | Ht 69.0 in | Wt 206.0 lb

## 2018-07-04 DIAGNOSIS — E119 Type 2 diabetes mellitus without complications: Secondary | ICD-10-CM

## 2018-07-04 DIAGNOSIS — E785 Hyperlipidemia, unspecified: Secondary | ICD-10-CM

## 2018-07-04 DIAGNOSIS — E039 Hypothyroidism, unspecified: Secondary | ICD-10-CM

## 2018-07-04 NOTE — Progress Notes (Signed)
Virtual Visit via Video   I connected with patient on 07/04/18 at  9:00 AM EDT by a video enabled telemedicine application and verified that I am speaking with the correct person using two identifiers.  Location patient: Home Location provider: [facility], Office Persons participating in the virtual visit: Patient, Provider, PA-Student Anibal Henderson), CMA (Eduard Clos)  I discussed the limitations of evaluation and management by telemedicine and the availability of in person appointments. The patient expressed understanding and agreed to proceed.  Subjective:   HPI:   Patient presents via [facility] today for follow-up regarding diabetes mellitus II. Patient is currently on a regimen of Metformin 1000 mg BID and Jardiance 10 mg QD. Endorses taking medications daily as directed. Endorses checking glucose levels as directed, noting fasting sugars averaging in the 130s. In terms of diet, patient endorses well-balanced overall. Is working on snacking. For exercise he is either working in the yard or cycling daily, for about 20-30 minutes per day. Also walks as well. Patient is taking his Pravachol 80 mg daily as directed. This was increased from 40 mg due to LDL above goal at last check. Immunizations and eye examinations are up-to-date. Is due for foot exam but will be deferred to next visit which should be in-office.  Lab Results  Component Value Date   HGBA1C 8.6 (H) 03/22/2018   Lab Results  Component Value Date   LDLCALC 119 (H) 03/22/2018    Patient is also overdue for follow-up regarding hypothyroidism. Is currently on a regimen of levothyroxine 175 mcg daily. Endorses taking as directed. Last TSH was low. Was instructed to be consistent with medication and to return for lab recheck in 6 weeks. Did not follow-up as directed. Notes mood stable. Denies change in bowel habits, xerosis of skin.  Lab Results  Component Value Date   TSH 0.23 (L) 03/22/2018   ROS:   See  pertinent positives and negatives per HPI.  Patient Active Problem List   Diagnosis Date Noted  . Prostate cancer screening 03/22/2018  . Acute non-recurrent maxillary sinusitis 03/22/2018  . Visit for preventive health examination 05/22/2016  . Hypogonadism in male 05/22/2016  . Bilateral shoulder pain 03/29/2016  . Perennial and seasonal allergic rhinitis 08/27/2015  . Non morbid obesity due to excess calories 05/30/2015  . Anxiety disorder 05/19/2015  . Hyperlipidemia 05/19/2015  . Hypothyroidism 05/19/2015  . Diabetes mellitus type II, controlled, with no complications ([place]) 62/22/9798  . S/P shoulder replacement 12/27/2014    Social History   Tobacco Use  . Smoking status: Former Smoker    Years: 15.00    Types: Cigarettes    Quit date: 1996    Years since quitting: 24.4  . Smokeless tobacco: Former Network engineer Use Topics  . Alcohol use: No    Current Outpatient Medications:  .  Albuterol Sulfate (PROAIR RESPICLICK) 921 (90 Base) MCG/ACT AEPB, Inhale 2 puffs into the lungs every 6 (six) hours as needed., Disp: 1 each, Rfl: 3 .  amoxicillin-clavulanate (AUGMENTIN) 875-125 MG tablet, Take 1 tablet by mouth 2 (two) times daily., Disp: 14 tablet, Rfl: 0 .  beclomethasone (QVAR) 80 MCG/ACT inhaler, 2 puffs twice daily for ONE TO TWO WEEKS. (Only with respiratory illnesses), Disp: 8.7 g, Rfl: 5 .  fluticasone (FLONASE) 50 MCG/ACT nasal spray, Place 2 sprays into both nostrils daily., Disp: 16 g, Rfl: 5 .  glucose blood ([employer] TEST STRIPS) test strip, Please check blood sugar once a day due to fluctuating blood sugars.,  Disp: , Rfl:  .  hydrOXYzine (ATARAX/VISTARIL) 25 MG tablet, TAKE 1 TABLET BY MOUTH 3 TIMES A DAY AS NEEDED, Disp: 90 tablet, Rfl: 2 .  JARDIANCE 10 MG TABS tablet, TAKE 1 TABLET BY MOUTH DAILY, Disp: 30 tablet, Rfl: 0 .  levothyroxine (SYNTHROID, LEVOTHROID) 175 MCG tablet, Take 1 tablet (175 mcg total) by mouth daily before breakfast., Disp: 30 tablet, Rfl:  3 .  metFORMIN (GLUCOPHAGE) 500 MG tablet, TAKE 2 TABLETS BY MOUTH TWICE A DAY WITH MEALS, Disp: 360 tablet, Rfl: 1 .  montelukast (SINGULAIR) 10 MG tablet, Take 1 tablet (10 mg total) by mouth at bedtime., Disp: 30 tablet, Rfl: 5 .  Multiple Vitamin (MULTIVITAMIN WITH MINERALS) TABS tablet, Take 1 tablet by mouth daily., Disp: , Rfl:  .  omeprazole (PRILOSEC) 20 MG capsule, Take 20 mg by mouth daily as needed (acid reflux). , Disp: , Rfl: 5 .  pravastatin (PRAVACHOL) 80 MG tablet, TAKE 1 TABLET BY MOUTH EVERY DAY, Disp: 90 tablet, Rfl: 1  No Known Allergies  Objective:   Temp 97.9 F (36.6 C) (Oral)   Resp 16   Ht 5' 9"  (1.753 m)   Wt 206 lb (93.4 kg)   BMI 30.42 kg/m   Patient is well-developed, well-nourished in no acute distress.  Resting comfortably at home.  Head is normocephalic, atraumatic.  No labored breathing.  Speech is clear and coherent with logical contest.  Patient is alert and oriented at baseline.   Assessment and Plan:   1. Controlled type 2 diabetes mellitus without complication, without long-term current use of insulin ([place]) UTD on eye examination and immunizations. Will update foot exam when he comes into office the next next. Taking medications as directed. Will repeat labs. Alter regimen accordingly.  - Hemoglobin A1c; Future - Comp Met (CMET); Future  2. Hyperlipidemia, unspecified hyperlipidemia type Taking statin as directed. Will see if LDL has improved with change in dose. Repeat fasting lipids scheduled.  - Lipid Profile; Future  3. Hypothyroidism, unspecified type Due for repeat TSH as last was low. Asymptomatic. Repeat levels ordered. Lab appt schedule Wednesday morning. Will alter regimen accordingly.  - TSH; Future    Leeanne Rio, PA-C 07/04/2018

## 2018-07-04 NOTE — Progress Notes (Signed)
I have discussed the procedure for the virtual visit with the patient who has given consent to proceed with assessment and treatment.   Albie Arizpe S Lawrance Wiedemann, CMA     

## 2018-07-06 ENCOUNTER — Ambulatory Visit (INDEPENDENT_AMBULATORY_CARE_PROVIDER_SITE_OTHER): Payer: BC Managed Care – PPO | Admitting: Physician Assistant

## 2018-07-06 ENCOUNTER — Other Ambulatory Visit: Payer: Self-pay

## 2018-07-06 DIAGNOSIS — E785 Hyperlipidemia, unspecified: Secondary | ICD-10-CM | POA: Diagnosis not present

## 2018-07-06 DIAGNOSIS — E119 Type 2 diabetes mellitus without complications: Secondary | ICD-10-CM

## 2018-07-06 DIAGNOSIS — E039 Hypothyroidism, unspecified: Secondary | ICD-10-CM

## 2018-07-06 LAB — COMPREHENSIVE METABOLIC PANEL
ALT: 19 U/L (ref 0–53)
AST: 13 U/L (ref 0–37)
Albumin: 4.3 g/dL (ref 3.5–5.2)
Alkaline Phosphatase: 73 U/L (ref 39–117)
BUN: 17 mg/dL (ref 6–23)
CO2: 27 mEq/L (ref 19–32)
Calcium: 9.2 mg/dL (ref 8.4–10.5)
Chloride: 99 mEq/L (ref 96–112)
Creatinine, Ser: 0.73 mg/dL (ref 0.40–1.50)
GFR: 109.91 mL/min (ref >60.00)
Glucose, Bld: 129 mg/dL — ABNORMAL HIGH (ref 70–99)
Potassium: 4.1 mEq/L (ref 3.5–5.1)
Sodium: 135 mEq/L (ref 135–145)
Total Bilirubin: 0.6 mg/dL (ref 0.2–1.2)
Total Protein: 6.8 g/dL (ref 6.0–8.3)

## 2018-07-06 LAB — TSH: TSH: 0.01 u[IU]/mL — ABNORMAL LOW (ref 0.35–4.50)

## 2018-07-06 LAB — LIPID PANEL
Cholesterol: 164 mg/dL (ref 0–200)
HDL: 45.6 mg/dL (ref >39.00)
LDL Cholesterol: 97 mg/dL (ref 0–99)
NonHDL: 118.79
Total CHOL/HDL Ratio: 4
Triglycerides: 110 mg/dL (ref 0.0–149.0)
VLDL: 22 mg/dL (ref 0.0–40.0)

## 2018-07-07 ENCOUNTER — Other Ambulatory Visit (INDEPENDENT_AMBULATORY_CARE_PROVIDER_SITE_OTHER): Payer: BC Managed Care – PPO

## 2018-07-07 DIAGNOSIS — R7989 Other specified abnormal findings of blood chemistry: Secondary | ICD-10-CM | POA: Diagnosis not present

## 2018-07-07 LAB — HEMOGLOBIN A1C: Hgb A1c MFr Bld: 6.9 % — ABNORMAL HIGH (ref 4.6–6.5)

## 2018-07-07 LAB — T3, FREE: T3, Free: 3.2 pg/mL (ref 2.3–4.2)

## 2018-07-07 LAB — T4, FREE: Free T4: 1.58 ng/dL (ref 0.60–1.60)

## 2018-07-13 ENCOUNTER — Encounter: Payer: Self-pay | Admitting: Physician Assistant

## 2018-07-14 ENCOUNTER — Other Ambulatory Visit: Payer: Self-pay | Admitting: Emergency Medicine

## 2018-07-14 DIAGNOSIS — E039 Hypothyroidism, unspecified: Secondary | ICD-10-CM

## 2018-07-14 MED ORDER — LEVOTHYROXINE SODIUM 150 MCG PO TABS: 150.0000 ug | ORAL_TABLET | Freq: Every day | ORAL | 0 refills | Status: DC

## 2018-07-14 NOTE — Telephone Encounter (Signed)
See result note -- we will decrease to 150 mcg daily. Repeat TSH in 6 weeks.

## 2018-07-19 ENCOUNTER — Ambulatory Visit (INDEPENDENT_AMBULATORY_CARE_PROVIDER_SITE_OTHER): Payer: BLUE CROSS/BLUE SHIELD | Admitting: *Deleted

## 2018-07-19 DIAGNOSIS — J309 Allergic rhinitis, unspecified: Secondary | ICD-10-CM

## 2018-07-27 ENCOUNTER — Other Ambulatory Visit: Payer: Self-pay | Admitting: Physician Assistant

## 2018-07-27 NOTE — Progress Notes (Signed)
VIALS EXP 07-27-2019

## 2018-07-28 ENCOUNTER — Encounter: Payer: Self-pay | Admitting: Physician Assistant

## 2018-07-28 ENCOUNTER — Other Ambulatory Visit: Payer: Self-pay | Admitting: Emergency Medicine

## 2018-07-28 DIAGNOSIS — J301 Allergic rhinitis due to pollen: Secondary | ICD-10-CM

## 2018-07-28 DIAGNOSIS — E119 Type 2 diabetes mellitus without complications: Secondary | ICD-10-CM

## 2018-07-28 MED ORDER — JARDIANCE 10 MG PO TABS: 10.0000 mg | ORAL_TABLET | Freq: Every day | ORAL | 1 refills | Status: DC

## 2018-08-02 ENCOUNTER — Telehealth: Payer: Self-pay | Admitting: Allergy & Immunology

## 2018-08-02 NOTE — Telephone Encounter (Signed)
Called patient to set up an OV for insurance purposes, to continue on shots. He is due after Sept 09-2018.

## 2018-08-09 ENCOUNTER — Ambulatory Visit (INDEPENDENT_AMBULATORY_CARE_PROVIDER_SITE_OTHER): Payer: BC Managed Care – PPO

## 2018-08-09 DIAGNOSIS — J309 Allergic rhinitis, unspecified: Secondary | ICD-10-CM | POA: Diagnosis not present

## 2018-09-02 ENCOUNTER — Ambulatory Visit (INDEPENDENT_AMBULATORY_CARE_PROVIDER_SITE_OTHER): Payer: BC Managed Care – PPO

## 2018-09-02 DIAGNOSIS — J309 Allergic rhinitis, unspecified: Secondary | ICD-10-CM

## 2018-09-27 ENCOUNTER — Ambulatory Visit (INDEPENDENT_AMBULATORY_CARE_PROVIDER_SITE_OTHER): Payer: BC Managed Care – PPO | Admitting: *Deleted

## 2018-09-27 DIAGNOSIS — J309 Allergic rhinitis, unspecified: Secondary | ICD-10-CM | POA: Diagnosis not present

## 2018-10-19 ENCOUNTER — Encounter: Payer: Self-pay | Admitting: Physician Assistant

## 2018-10-19 DIAGNOSIS — E039 Hypothyroidism, unspecified: Secondary | ICD-10-CM

## 2018-10-21 ENCOUNTER — Ambulatory Visit (INDEPENDENT_AMBULATORY_CARE_PROVIDER_SITE_OTHER): Payer: BC Managed Care – PPO | Admitting: *Deleted

## 2018-10-21 DIAGNOSIS — J309 Allergic rhinitis, unspecified: Secondary | ICD-10-CM

## 2018-11-03 ENCOUNTER — Other Ambulatory Visit: Payer: Self-pay

## 2018-11-03 ENCOUNTER — Ambulatory Visit (INDEPENDENT_AMBULATORY_CARE_PROVIDER_SITE_OTHER): Payer: BC Managed Care – PPO

## 2018-11-03 DIAGNOSIS — E039 Hypothyroidism, unspecified: Secondary | ICD-10-CM

## 2018-11-03 DIAGNOSIS — Z23 Encounter for immunization: Secondary | ICD-10-CM | POA: Diagnosis not present

## 2018-11-03 LAB — T4, FREE: Free T4: 1.21 ng/dL (ref 0.60–1.60)

## 2018-11-03 LAB — TSH: TSH: 1.13 u[IU]/mL (ref 0.35–4.50)

## 2018-11-08 ENCOUNTER — Other Ambulatory Visit: Payer: Self-pay | Admitting: Physician Assistant

## 2018-11-08 ENCOUNTER — Encounter: Payer: Self-pay | Admitting: Physician Assistant

## 2018-11-08 DIAGNOSIS — E1165 Type 2 diabetes mellitus with hyperglycemia: Secondary | ICD-10-CM

## 2018-11-08 DIAGNOSIS — E039 Hypothyroidism, unspecified: Secondary | ICD-10-CM

## 2018-11-14 ENCOUNTER — Encounter: Payer: Self-pay | Admitting: Allergy

## 2018-11-14 ENCOUNTER — Other Ambulatory Visit: Payer: Self-pay

## 2018-11-14 ENCOUNTER — Ambulatory Visit (INDEPENDENT_AMBULATORY_CARE_PROVIDER_SITE_OTHER): Payer: BC Managed Care – PPO | Admitting: Allergy

## 2018-11-14 VITALS — BP 122/80 | HR 84 | Temp 97.6°F | Resp 16 | Ht 69.0 in | Wt 211.2 lb

## 2018-11-14 DIAGNOSIS — J4530 Mild persistent asthma, uncomplicated: Secondary | ICD-10-CM

## 2018-11-14 DIAGNOSIS — J309 Allergic rhinitis, unspecified: Secondary | ICD-10-CM

## 2018-11-14 DIAGNOSIS — J3089 Other allergic rhinitis: Secondary | ICD-10-CM

## 2018-11-14 MED ORDER — MONTELUKAST SODIUM 10 MG PO TABS: 10.0000 mg | ORAL_TABLET | Freq: Every day | ORAL | 5 refills | Status: DC

## 2018-11-14 NOTE — Progress Notes (Signed)
Follow Up Note  RE: Shaun Velasquez MRN: [id number] DOB: 21-Mar-1960 Date of Office Visit: 11/14/2018  Referring provider: Delorse Limber Primary care provider: Brunetta Jeans, PA-C  Chief Complaint: Allergic Rhinitis   History of Present Illness: I had the pleasure of seeing Shaun Velasquez for a follow up visit at the [facility] [place] [facility] on 11/14/2018. He is a 58 y.o. male, who is being followed for asthma allergic rhinitis. Today he is here for regular follow up visit. His previous allergy office visit was on 09/20/2017 with Dr. Ernst Bowler.   Asthma Not using any inhalers but noted that cough seems to persist for 1-2 months after a URI. Does have Qvar and albuterol at home to use if needed.  Perennial and seasonal allergic rhinitis Doing well with allergy injections and started a new vial today with no issues. Noticed significant improvement with the injections. Worst season is the spring.  Uses hydroxyzine and nasal spray prn.   Assessment and Plan: [name] is a 58 y.o. male with: Mild persistent asthma without complication  Post infectious cough lasting 1-2 months at times. Has Qvar and albuterol at home. Today's spirometry was normal. . Daily controller medication(s): NONE . Prior to physical activity: May use albuterol rescue inhaler 2 puffs 5 to 15 minutes prior to strenuous physical activities. [name] Rescue medications: May use albuterol rescue inhaler 2 puffs or nebulizer every 4 to 6 hours as needed for shortness of breath, chest tightness, coughing, and wheezing. Monitor frequency of use.  . During upper respiratory infections/asthma flares: Start Qvar 57mcg 2 puffs twice a day for 1-2 weeks and rinse mouth afterwards.  Perennial and seasonal allergic rhinitis Past history - started allergy injections on 10/29/2015 ([employer]). Interim history - doing well on injections and noticed significant improvement especially in the spring.  Continue  allergy injections (now on every 4 weeks)  May use Singulair 10mg  daily (montelukast) as needed - this helps with asthma and allergies.   May use over the counter antihistamines such as Zyrtec (cetirizine), Claritin (loratadine), Allegra (fexofenadine), or Xyzal (levocetirizine) daily as needed.  May use Flonase 2 sprays daily as needed for nasal symptoms.   Return in about 1 year (around 11/14/2019).  Meds ordered this encounter  Medications  . montelukast (SINGULAIR) 10 MG tablet    Sig: Take 1 tablet (10 mg total) by mouth at bedtime.    Dispense:  30 tablet    Refill:  5   Diagnostics: Spirometry:  Tracings reviewed. His effort: Good reproducible efforts. FVC: 4.26L FEV1: 3.48L, 99% predicted FEV1/FVC ratio: 82% Interpretation: Spirometry consistent with normal pattern.  Please see scanned spirometry results for details.  Medication List:  Current Outpatient Medications  Medication Sig Dispense Refill  . empagliflozin (JARDIANCE) 10 MG TABS tablet Take 10 mg by mouth daily. 90 tablet 1  . fluticasone (FLONASE) 50 MCG/ACT nasal spray Place 2 sprays into both nostrils daily. 16 g 5  . glucose blood ([employer] TEST STRIPS) test strip Please check blood sugar once a day due to fluctuating blood sugars.    [name] levothyroxine (SYNTHROID) 150 MCG tablet TAKE 1 TABLET BY MOUTH EVERY DAY 90 tablet 1  . metFORMIN (GLUCOPHAGE) 500 MG tablet TAKE 2 TABLETS BY MOUTH TWICE A DAY WITH MEALS 120 tablet 5  . Multiple Vitamin (MULTIVITAMIN WITH MINERALS) TABS tablet Take 1 tablet by mouth daily.    . pravastatin (PRAVACHOL) 80 MG tablet TAKE 1 TABLET BY MOUTH EVERY DAY  90 tablet 1  . Albuterol Sulfate (PROAIR RESPICLICK) 123XX123 (90 Base) MCG/ACT AEPB Inhale 2 puffs into the lungs every 6 (six) hours as needed. (Patient not taking: Reported on 11/14/2018) 1 each 3  . beclomethasone (QVAR) 80 MCG/ACT inhaler 2 puffs twice daily for ONE TO TWO WEEKS. (Only with respiratory illnesses) (Patient not taking:  Reported on 11/14/2018) 8.7 g 5  . betamethasone dipropionate 0.05 % cream Apply 1 application topically 2 (two) times daily.    . fluocinonide (LIDEX) 0.05 % external solution     . hydrOXYzine (ATARAX/VISTARIL) 25 MG tablet TAKE 1 TABLET BY MOUTH 3 TIMES A DAY AS NEEDED (Patient not taking: Reported on 11/14/2018) 90 tablet 2  . montelukast (SINGULAIR) 10 MG tablet Take 1 tablet (10 mg total) by mouth at bedtime. 30 tablet 5  . omeprazole (PRILOSEC) 20 MG capsule Take 20 mg by mouth daily as needed (acid reflux).   5  . Triamcinolone Acetonide 0.025 % LOTN      No current facility-administered medications for this visit.    Allergies: No Known Allergies I reviewed his past medical history, social history, family history, and environmental history and no significant changes have been reported from his previous visit.  Review of Systems  Constitutional: Negative for appetite change, chills, fever and unexpected weight change.  HENT: Negative for congestion and rhinorrhea.   Eyes: Negative for itching.  Respiratory: Negative for cough, chest tightness, shortness of breath and wheezing.   Gastrointestinal: Negative for abdominal pain.  Skin: Negative for rash.  Allergic/Immunologic: Positive for environmental allergies.  Neurological: Negative for headaches.   Objective: BP 122/80 (BP Location: Left Arm, Patient Position: Sitting, Cuff Size: Large)   Pulse 84   Temp 97.6 F (36.4 C) (Temporal)   Resp 16   Ht 5\' 9"  (1.753 m)   Wt 211 lb 3.2 oz (95.8 kg)   SpO2 98%   BMI 31.19 kg/m  Body mass index is 31.19 kg/m. Physical Exam  Constitutional: He is oriented to person, place, and time. He appears well-developed and well-nourished.  HENT:  Head: Normocephalic and atraumatic.  Right Ear: External ear normal.  Left Ear: External ear normal.  Nose: Nose normal.  Mouth/Throat: Oropharynx is clear and moist.  Eyes: Conjunctivae and EOM are normal.  Neck: Neck supple.   Cardiovascular: Normal rate, regular rhythm and normal heart sounds. Exam reveals no gallop and no friction rub.  No murmur heard. Pulmonary/Chest: Effort normal and breath sounds normal. He has no wheezes. He has no rales.  Neurological: He is alert and oriented to person, place, and time.  Skin: Skin is warm. No rash noted.  Psychiatric: He has a normal mood and affect. His behavior is normal.  Nursing note and vitals reviewed.  Previous notes and tests were reviewed. The plan was reviewed with the patient/family, and all questions/concerned were addressed.  It was my pleasure to see Akiles today and participate in his care. Please feel free to contact me with any questions or concerns.  Sincerely,  Rexene Alberts, DO Allergy & Immunology  [facility] office: [phone] [facility] office: [place] office: [phone]

## 2018-11-14 NOTE — Patient Instructions (Addendum)
Asthma: . Daily controller medication(s): NONE . Prior to physical activity: May use albuterol rescue inhaler 2 puffs 5 to 15 minutes prior to strenuous physical activities. [name] Rescue medications: May use albuterol rescue inhaler 2 puffs or nebulizer every 4 to 6 hours as needed for shortness of breath, chest tightness, coughing, and wheezing. Monitor frequency of use.  . During upper respiratory infections/asthma flares: Start Qvar 72mcg 2 puffs twice a day for 1-2 weeks and rinse mouth afterwards. . Asthma control goals:  o Full participation in all desired activities (may need albuterol before activity) o Albuterol use two times or less a week on average (not counting use with activity) o Cough interfering with sleep two times or less a month o Oral steroids no more than once a year o No hospitalizations  Environmental allergies:  Continue allergy injections.  May use Singulair 10mg  daily (montelukast) as needed - this helps with asthma and allergies.   May use over the counter antihistamines such as Zyrtec (cetirizine), Claritin (loratadine), Allegra (fexofenadine), or Xyzal (levocetirizine) daily as needed.  Follow up in 1 year or sooner if needed.

## 2018-11-14 NOTE — Assessment & Plan Note (Signed)
   Post infectious cough lasting 1-2 months at times. Has Qvar and albuterol at home. Today's spirometry was normal. . Daily controller medication(s): NONE . Prior to physical activity: May use albuterol rescue inhaler 2 puffs 5 to 15 minutes prior to strenuous physical activities. [name] Rescue medications: May use albuterol rescue inhaler 2 puffs or nebulizer every 4 to 6 hours as needed for shortness of breath, chest tightness, coughing, and wheezing. Monitor frequency of use.  . During upper respiratory infections/asthma flares: Start Qvar 75mcg 2 puffs twice a day for 1-2 weeks and rinse mouth afterwards.

## 2018-11-14 NOTE — Assessment & Plan Note (Addendum)
Past history - started allergy injections on 10/29/2015 ([facility]). Interim history - doing well on injections and noticed significant improvement especially in the spring.  Continue allergy injections (now on every 4 weeks)  May use Singulair 10mg  daily (montelukast) as needed - this helps with asthma and allergies.   May use over the counter antihistamines such as Zyrtec (cetirizine), Claritin (loratadine), Allegra (fexofenadine), or Xyzal (levocetirizine) daily as needed.  May use Flonase 2 sprays daily as needed for nasal symptoms.

## 2018-11-25 ENCOUNTER — Ambulatory Visit (INDEPENDENT_AMBULATORY_CARE_PROVIDER_SITE_OTHER): Payer: BC Managed Care – PPO

## 2018-11-25 DIAGNOSIS — J3089 Other allergic rhinitis: Secondary | ICD-10-CM | POA: Diagnosis not present

## 2018-12-19 ENCOUNTER — Ambulatory Visit (INDEPENDENT_AMBULATORY_CARE_PROVIDER_SITE_OTHER): Payer: BC Managed Care – PPO

## 2018-12-19 DIAGNOSIS — J309 Allergic rhinitis, unspecified: Secondary | ICD-10-CM

## 2018-12-19 DIAGNOSIS — J3089 Other allergic rhinitis: Secondary | ICD-10-CM

## 2018-12-22 ENCOUNTER — Other Ambulatory Visit: Payer: Self-pay | Admitting: Physician Assistant

## 2018-12-22 DIAGNOSIS — E785 Hyperlipidemia, unspecified: Secondary | ICD-10-CM

## 2018-12-26 ENCOUNTER — Ambulatory Visit (INDEPENDENT_AMBULATORY_CARE_PROVIDER_SITE_OTHER): Payer: BC Managed Care – PPO

## 2018-12-26 DIAGNOSIS — J309 Allergic rhinitis, unspecified: Secondary | ICD-10-CM

## 2018-12-28 ENCOUNTER — Ambulatory Visit (INDEPENDENT_AMBULATORY_CARE_PROVIDER_SITE_OTHER): Payer: BC Managed Care – PPO | Admitting: Physician Assistant

## 2018-12-28 ENCOUNTER — Other Ambulatory Visit: Payer: Self-pay

## 2018-12-28 ENCOUNTER — Encounter: Payer: Self-pay | Admitting: Physician Assistant

## 2018-12-28 VITALS — BP 120/82 | HR 73 | Temp 97.0°F | Resp 16 | Ht 69.0 in | Wt 212.0 lb

## 2018-12-28 DIAGNOSIS — B351 Tinea unguium: Secondary | ICD-10-CM | POA: Diagnosis not present

## 2018-12-28 DIAGNOSIS — E669 Obesity, unspecified: Secondary | ICD-10-CM | POA: Diagnosis not present

## 2018-12-28 DIAGNOSIS — E119 Type 2 diabetes mellitus without complications: Secondary | ICD-10-CM | POA: Diagnosis not present

## 2018-12-28 DIAGNOSIS — E785 Hyperlipidemia, unspecified: Secondary | ICD-10-CM | POA: Diagnosis not present

## 2018-12-28 LAB — MICROALBUMIN / CREATININE URINE RATIO
Creatinine,U: 55.7 mg/dL
Microalb Creat Ratio: 1.6 mg/g (ref 0.0–30.0)
Microalb, Ur: 0.9 mg/dL (ref 0.0–1.9)

## 2018-12-28 LAB — BASIC METABOLIC PANEL
BUN: 15 mg/dL (ref 6–23)
CO2: 25 mEq/L (ref 19–32)
Calcium: 9.5 mg/dL (ref 8.4–10.5)
Chloride: 101 mEq/L (ref 96–112)
Creatinine, Ser: 0.82 mg/dL (ref 0.40–1.50)
GFR: 95.95 mL/min (ref >60.00)
Glucose, Bld: 123 mg/dL — ABNORMAL HIGH (ref 70–99)
Potassium: 4.4 mEq/L (ref 3.5–5.1)
Sodium: 136 mEq/L (ref 135–145)

## 2018-12-28 LAB — HEMOGLOBIN A1C: Hgb A1c MFr Bld: 7.4 % — ABNORMAL HIGH (ref 4.6–6.5)

## 2018-12-28 MED ORDER — TERBINAFINE HCL 250 MG PO TABS: 250.0000 mg | ORAL_TABLET | Freq: Every day | ORAL | 0 refills | Status: DC

## 2018-12-28 NOTE — Progress Notes (Signed)
History of Present Illness: Patient is a 58 y.o. male who presents to clinic today for follow-up of Diabetes Mellitus II, controlled without complication.  Patient currently on medication regimen of Metformin 1000 mg BID and Jardiance 10 mg.  Endorses taking medications as directed.  Denies vision changes, polyuria, polydipsia or polyphagia, numbness/tingling of hands or feet. Not checking blood glucose regularly at present. Is trying to keep active but harder being home due to [place]. Endorses well-balanced diet overall. .   Latest Maintenance: A1C --  Lab Results  Component Value Date   HGBA1C 6.9 (H) 07/06/2018   Diabetic Eye Exam -- Up-to-date Urine Microalbumin -- Due. Will update today. Foot Exam -- Due. History of onychomycosis. Was to be started on Lamisil but insurance did not cover last year.  Past Medical History:  Diagnosis Date  . Allergy   . Anxiety   . Arthritis   . Collapsed lung   . Diabetes mellitus without complication ([place])   . Eczema   . GERD (gastroesophageal reflux disease)   . History of chickenpox   . Hyperlipemia   . Hypothyroidism   . PONV (postoperative nausea and vomiting)   . Psoriasis   . Uncontrolled diabetes mellitus type 2 without complications [id number]    Current Outpatient Medications on File Prior to Visit  Medication Sig Dispense Refill  . betamethasone dipropionate 0.05 % cream Apply 1 application topically 2 (two) times daily.    . empagliflozin (JARDIANCE) 10 MG TABS tablet Take 10 mg by mouth daily. 90 tablet 1  . fluocinonide (LIDEX) 0.05 % external solution     . fluticasone (FLONASE) 50 MCG/ACT nasal spray Place 2 sprays into both nostrils daily. 16 g 5  . glucose blood ([employer] TEST STRIPS) test strip Please check blood sugar once a day due to fluctuating blood sugars.    [name] levothyroxine (SYNTHROID) 150 MCG tablet TAKE 1 TABLET BY MOUTH EVERY DAY 90 tablet 1  . metFORMIN (GLUCOPHAGE) 500 MG tablet TAKE 2 TABLETS BY MOUTH TWICE A  DAY WITH MEALS 120 tablet 5  . montelukast (SINGULAIR) 10 MG tablet Take 1 tablet (10 mg total) by mouth at bedtime. 30 tablet 5  . Multiple Vitamin (MULTIVITAMIN WITH MINERALS) TABS tablet Take 1 tablet by mouth daily.    [name] omeprazole (PRILOSEC) 20 MG capsule Take 20 mg by mouth daily as needed (acid reflux).   5  . pravastatin (PRAVACHOL) 80 MG tablet TAKE 1 TABLET BY MOUTH EVERY DAY 90 tablet 0  . Triamcinolone Acetonide 0.025 % LOTN      No current facility-administered medications on file prior to visit.    No Known Allergies  Family History  Problem Relation Age of Onset  . Allergic rhinitis Mother   . Lung cancer Mother   . Asthma Sister   . Cancer Sister        Skin    Social History   Socioeconomic History  . Marital status: Married    Spouse name: Not on file  . Number of children: Not on file  . Years of education: Not on file  . Highest education level: Not on file  Occupational History  . Not on file  Tobacco Use  . Smoking status: Former Smoker    Years: 15.00    Types: Cigarettes    Quit date: 1996    Years since quitting: 24.9  . Smokeless tobacco: Former Network engineer and Sexual Activity  . Alcohol use: No  . Drug use:  No  . Sexual activity: Not on file  Other Topics Concern  . Not on file  Social History Narrative  . Not on file   Social Determinants of Health   Financial Resource Strain:   . Difficulty of Paying Living Expenses: Not on file  Food Insecurity:   . Worried About Charity fundraiser in the Last Year: Not on file  . Ran Out of Food in the Last Year: Not on file  Transportation Needs:   . Lack of Transportation (Medical): Not on file  . Lack of Transportation (Non-Medical): Not on file  Physical Activity:   . Days of Exercise per Week: Not on file  . Minutes of Exercise per Session: Not on file  Stress:   . Feeling of Stress : Not on file  Social Connections:   . Frequency of Communication with Friends and Family: Not on  file  . Frequency of Social Gatherings with Friends and Family: Not on file  . Attends Religious Services: Not on file  . Active Member of Clubs or Organizations: Not on file  . Attends Archivist Meetings: Not on file  . Marital Status: Not on file   Review of Systems: Pertinent ROS are listed in HPI  Physical Examination: BP 120/82   Pulse 73   Temp (!) 97 F (36.1 C) (Temporal)   Resp 16   Ht 5\' 9"  (1.753 m)   Wt 212 lb (96.2 kg)   SpO2 99%   BMI 31.31 kg/m  General appearance: alert, cooperative, appears stated age and no distress Head: Normocephalic, without obvious abnormality, atraumatic Lungs: clear to auscultation bilaterally Heart: regular rate and rhythm, S1, S2 normal, no murmur, click, rub or gallop Extremities: extremities normal, atraumatic, no cyanosis or edema Pulses: 2+ and symmetric Skin: Skin color, texture, turgor normal. No rashes or lesions Neurologic: Grossly normal  Diabetic Foot Form - Detailed   Diabetic Foot Exam - detailed Diabetic Foot exam was performed with the following findings: Yes 12/28/2018  8:31 AM  Visual Foot Exam completed.: Yes  Can the patient see the bottom of their feet?: Yes Are the shoes appropriate in style and fit?: Yes Is there swelling or and abnormal foot shape?: No Is there a claw toe deformity?: No Is there elevated skin temparature?: No Is there foot or ankle muscle weakness?: No Normal Range of Motion: Yes Pulse Foot Exam completed.: Yes  Right posterior Tibialias: Present Left posterior Tibialias: Present  Right Dorsalis Pedis: Present Left Dorsalis Pedis: Present  Semmes-Weinstein Monofilament Test R Site 1-Great Toe: Pos L Site 1-Great Toe: Pos    Comments: Onychomycosis bilaterally with R great toenail significantly worse.     Assessment/Plan: 1. Controlled type 2 diabetes mellitus without complication, without long-term current use of insulin ([place]) Patient taking medications as directed.  Still  tolerating well without adverse events.  Not currently checking sugars on a regular basis due to A1c being well controlled previously.  Is keeping up with diet.  Exercise has been a bit more challenging due to Covid.  Will repeat labs today to include A1c, BMP and urine microalbumin.  Foot exam updated today.  The only remarkable finding was onychomycosis.  See treatment below.  Will alter regimen according to lab results.  If A1c still less than 7.5 we will follow-up every 6 months. - Basic metabolic panel - Hemoglobin A1c - Urine Microalbumin w/creat. ratio  2. Hyperlipidemia, unspecified hyperlipidemia type Taking medication as directed.  Tolerating well.  Recent  lipid panel within normal limits.  Will check yearly.  3. Obesity (BMI 30-39.9) Patient keeping well-balanced diet.  Is working on exercise.  Will monitor.  Repeat labs today.  4. Onychomycosis Vinegar soaks discussed with patient.  On review of available notes on patient's phone from dermatologist seems he was mistakenly prescribed Lamictal for onychomycosis.  Thankfully the prescription was too expensive so he never picked up or started taking.  Recent hepatic function within normal limits.  Will start daily Lamisil.  Repeat LFTs in 3 to 4 weeks.  For toenails, discussed will take up to 3 months for clearance.  Will monitor closely.    This visit occurred during the SARS-CoV-2 public health emergency.  Safety protocols were in place, including screening questions prior to the visit, additional usage of staff PPE, and extensive cleaning of exam room while observing appropriate contact time as indicated for disinfecting solutions.

## 2018-12-28 NOTE — Patient Instructions (Signed)
Please go to the lab today for blood work.  I will call you with your results. We will alter treatment regimen(s) if indicated by your results.   Please continue chronic medications as directed for now. Please do the vinegar soaks as discussed, a couple of times per week. I have sent in the Terbinafine for the nail fungus.  Take daily. It can take 3 months to clear. We will need to recheck your liver function, 3-4 weeks after starting medication.  Take care!   Fungal Nail Infection A fungal nail infection is a common infection of the toenails or fingernails. This condition affects toenails more often than fingernails. It often affects the great, or big, toes. More than one nail may be infected. The condition can be passed from person to person (is contagious). What are the causes? This condition is caused by a fungus. Several types of fungi can cause the infection. These fungi are common in moist and warm areas. If your hands or feet come into contact with the fungus, it may get into a crack in your fingernail or toenail and cause the infection. What increases the risk? The following factors may make you more likely to develop this condition:  Being male.  Being of older age.  Living with someone who has the fungus.  Walking barefoot in areas where the fungus thrives, such as showers or locker rooms.  Wearing shoes and socks that cause your feet to sweat.  Having a nail injury or a recent nail surgery.  Having certain medical conditions, such as: ? Athlete's foot. ? Diabetes. ? Psoriasis. ? Poor circulation. ? A weak body defense system (immune system). What are the signs or symptoms? Symptoms of this condition include:  A pale spot on the nail.  Thickening of the nail.  A nail that becomes yellow or brown.  A brittle or ragged nail edge.  A crumbling nail.  A nail that has lifted away from the nail bed. How is this diagnosed? This condition is diagnosed with a  physical exam. Your health care provider may take a scraping or clipping from your nail to test for the fungus. How is this treated? Treatment is not needed for mild infections. If you have significant nail changes, treatment may include:  Antifungal medicines taken by mouth (orally). You may need to take the medicine for several weeks or several months, and you may not see the results for a long time. These medicines can cause side effects. Ask your health care provider what problems to watch for.  Antifungal nail polish or nail cream. These may be used along with oral antifungal medicines.  Laser treatment of the nail.  Surgery to remove the nail. This may be needed for the most severe infections. It can take a long time, usually up to a year, for the infection to go away. The infection may also come back. Follow these instructions at home: Medicines  Take or apply over-the-counter and prescription medicines only as told by your health care provider.  Ask your health care provider about using over-the-counter mentholated ointment on your nails. Nail care  Trim your nails often.  Wash and dry your hands and feet every day.  Keep your feet dry: ? Wear absorbent socks, and change your socks frequently. ? Wear shoes that allow air to circulate, such as sandals or canvas tennis shoes. Throw out old shoes.  Do not use artificial nails.  If you go to a nail salon, make sure you choose  one that uses clean instruments.  Use antifungal foot powder on your feet and in your shoes. General instructions  Do not share personal items, such as towels or nail clippers.  Do not walk barefoot in shower rooms or locker rooms.  Wear rubber gloves if you are working with your hands in wet areas.  Keep all follow-up visits as told by your health care provider. This is important. Contact a health care provider if: Your infection is not getting better or it is getting worse after several months.  Summary  A fungal nail infection is a common infection of the toenails or fingernails.  Treatment is not needed for mild infections. If you have significant nail changes, treatment may include taking medicine orally and applying medicine to your nails.  It can take a long time, usually up to a year, for the infection to go away. The infection may also come back.  Take or apply over-the-counter and prescription medicines only as told by your health care provider.  Follow instructions for taking care of your nails to help prevent infection from coming back or spreading. This information is not intended to replace advice given to you by your health care provider. Make sure you discuss any questions you have with your health care provider. Document Released: 12/27/1999 Document Revised: 04/21/2018 Document Reviewed: 06/04/2017 Elsevier Patient Education  2020 [employer].

## 2019-01-03 ENCOUNTER — Ambulatory Visit (INDEPENDENT_AMBULATORY_CARE_PROVIDER_SITE_OTHER): Payer: BC Managed Care – PPO

## 2019-01-03 DIAGNOSIS — J309 Allergic rhinitis, unspecified: Secondary | ICD-10-CM

## 2019-01-10 ENCOUNTER — Ambulatory Visit (INDEPENDENT_AMBULATORY_CARE_PROVIDER_SITE_OTHER): Payer: BC Managed Care – PPO

## 2019-01-10 DIAGNOSIS — J309 Allergic rhinitis, unspecified: Secondary | ICD-10-CM

## 2019-01-24 ENCOUNTER — Ambulatory Visit: Payer: BC Managed Care – PPO

## 2019-01-26 ENCOUNTER — Other Ambulatory Visit: Payer: Self-pay | Admitting: Physician Assistant

## 2019-02-07 ENCOUNTER — Ambulatory Visit: Payer: BC Managed Care – PPO

## 2019-02-10 ENCOUNTER — Ambulatory Visit (INDEPENDENT_AMBULATORY_CARE_PROVIDER_SITE_OTHER): Payer: BC Managed Care – PPO | Admitting: *Deleted

## 2019-02-10 DIAGNOSIS — J309 Allergic rhinitis, unspecified: Secondary | ICD-10-CM | POA: Diagnosis not present

## 2019-02-21 ENCOUNTER — Ambulatory Visit: Payer: BC Managed Care – PPO

## 2019-02-21 ENCOUNTER — Other Ambulatory Visit: Payer: Self-pay | Admitting: Physician Assistant

## 2019-02-21 DIAGNOSIS — E119 Type 2 diabetes mellitus without complications: Secondary | ICD-10-CM

## 2019-02-21 NOTE — Telephone Encounter (Signed)
Pt stated already fill Rx once this year and not to bad.  Will call if any changes.

## 2019-02-21 NOTE — Telephone Encounter (Signed)
Medication is not longer on pt formulary.  Pleased advised on further refills

## 2019-02-28 ENCOUNTER — Other Ambulatory Visit: Payer: Self-pay

## 2019-02-28 ENCOUNTER — Ambulatory Visit (INDEPENDENT_AMBULATORY_CARE_PROVIDER_SITE_OTHER): Payer: BC Managed Care – PPO | Admitting: *Deleted

## 2019-02-28 DIAGNOSIS — E785 Hyperlipidemia, unspecified: Secondary | ICD-10-CM

## 2019-02-28 LAB — HEPATIC FUNCTION PANEL
ALT: 18 U/L (ref 0–53)
AST: 13 U/L (ref 0–37)
Albumin: 4.4 g/dL (ref 3.5–5.2)
Alkaline Phosphatase: 89 U/L (ref 39–117)
Bilirubin, Direct: 0.1 mg/dL (ref 0.0–0.3)
Total Bilirubin: 0.6 mg/dL (ref 0.2–1.2)
Total Protein: 7.3 g/dL (ref 6.0–8.3)

## 2019-03-05 ENCOUNTER — Other Ambulatory Visit: Payer: Self-pay | Admitting: Physician Assistant

## 2019-03-06 ENCOUNTER — Other Ambulatory Visit: Payer: Self-pay | Admitting: Physician Assistant

## 2019-03-09 ENCOUNTER — Encounter: Payer: Self-pay | Admitting: Physician Assistant

## 2019-03-10 MED ORDER — TERBINAFINE HCL 250 MG PO TABS: 250.0000 mg | ORAL_TABLET | Freq: Every day | ORAL | 1 refills | Status: DC

## 2019-03-15 ENCOUNTER — Ambulatory Visit (INDEPENDENT_AMBULATORY_CARE_PROVIDER_SITE_OTHER): Payer: BC Managed Care – PPO

## 2019-03-15 DIAGNOSIS — J309 Allergic rhinitis, unspecified: Secondary | ICD-10-CM

## 2019-04-01 ENCOUNTER — Other Ambulatory Visit: Payer: Self-pay | Admitting: Physician Assistant

## 2019-04-01 DIAGNOSIS — E785 Hyperlipidemia, unspecified: Secondary | ICD-10-CM

## 2019-04-12 ENCOUNTER — Ambulatory Visit (INDEPENDENT_AMBULATORY_CARE_PROVIDER_SITE_OTHER): Payer: BC Managed Care – PPO

## 2019-04-12 DIAGNOSIS — J309 Allergic rhinitis, unspecified: Secondary | ICD-10-CM

## 2019-05-10 ENCOUNTER — Ambulatory Visit (INDEPENDENT_AMBULATORY_CARE_PROVIDER_SITE_OTHER): Payer: BC Managed Care – PPO

## 2019-05-10 ENCOUNTER — Other Ambulatory Visit: Payer: Self-pay | Admitting: Physician Assistant

## 2019-05-10 DIAGNOSIS — J309 Allergic rhinitis, unspecified: Secondary | ICD-10-CM | POA: Diagnosis not present

## 2019-05-10 NOTE — Progress Notes (Signed)
Vials exp 05-09-20

## 2019-05-11 DIAGNOSIS — J3089 Other allergic rhinitis: Secondary | ICD-10-CM

## 2019-05-11 NOTE — Telephone Encounter (Signed)
Terbinafine 250mg  tab  Last filled 03/10/19 #30 with 1refill Last office visit 12/28/18 with no future visit scheduled.

## 2019-05-15 ENCOUNTER — Encounter: Payer: Self-pay | Admitting: Physician Assistant

## 2019-05-16 MED ORDER — TERBINAFINE HCL 250 MG PO TABS: 250.0000 mg | ORAL_TABLET | Freq: Every day | ORAL | 0 refills | Status: DC

## 2019-05-25 ENCOUNTER — Other Ambulatory Visit: Payer: Self-pay | Admitting: Allergy

## 2019-05-25 ENCOUNTER — Other Ambulatory Visit: Payer: Self-pay | Admitting: Physician Assistant

## 2019-05-25 DIAGNOSIS — E1165 Type 2 diabetes mellitus with hyperglycemia: Secondary | ICD-10-CM

## 2019-05-25 DIAGNOSIS — E039 Hypothyroidism, unspecified: Secondary | ICD-10-CM

## 2019-06-07 ENCOUNTER — Ambulatory Visit (INDEPENDENT_AMBULATORY_CARE_PROVIDER_SITE_OTHER): Payer: BC Managed Care – PPO

## 2019-06-07 DIAGNOSIS — J309 Allergic rhinitis, unspecified: Secondary | ICD-10-CM | POA: Diagnosis not present

## 2019-06-13 ENCOUNTER — Other Ambulatory Visit: Payer: Self-pay | Admitting: Physician Assistant

## 2019-07-06 ENCOUNTER — Ambulatory Visit (INDEPENDENT_AMBULATORY_CARE_PROVIDER_SITE_OTHER): Payer: BC Managed Care – PPO

## 2019-07-06 DIAGNOSIS — J309 Allergic rhinitis, unspecified: Secondary | ICD-10-CM

## 2019-07-13 ENCOUNTER — Ambulatory Visit (INDEPENDENT_AMBULATORY_CARE_PROVIDER_SITE_OTHER): Payer: BC Managed Care – PPO

## 2019-07-13 DIAGNOSIS — J309 Allergic rhinitis, unspecified: Secondary | ICD-10-CM | POA: Diagnosis not present

## 2019-07-24 ENCOUNTER — Ambulatory Visit (INDEPENDENT_AMBULATORY_CARE_PROVIDER_SITE_OTHER): Payer: BC Managed Care – PPO

## 2019-07-24 DIAGNOSIS — J309 Allergic rhinitis, unspecified: Secondary | ICD-10-CM

## 2019-08-03 ENCOUNTER — Ambulatory Visit (INDEPENDENT_AMBULATORY_CARE_PROVIDER_SITE_OTHER): Payer: BC Managed Care – PPO

## 2019-08-03 DIAGNOSIS — J309 Allergic rhinitis, unspecified: Secondary | ICD-10-CM | POA: Diagnosis not present

## 2019-08-10 ENCOUNTER — Ambulatory Visit (INDEPENDENT_AMBULATORY_CARE_PROVIDER_SITE_OTHER): Payer: BC Managed Care – PPO

## 2019-08-10 DIAGNOSIS — J309 Allergic rhinitis, unspecified: Secondary | ICD-10-CM | POA: Diagnosis not present

## 2019-09-04 ENCOUNTER — Other Ambulatory Visit: Payer: Self-pay | Admitting: Physician Assistant

## 2019-09-04 DIAGNOSIS — E119 Type 2 diabetes mellitus without complications: Secondary | ICD-10-CM

## 2019-09-11 ENCOUNTER — Ambulatory Visit (INDEPENDENT_AMBULATORY_CARE_PROVIDER_SITE_OTHER): Payer: BC Managed Care – PPO

## 2019-09-11 DIAGNOSIS — J309 Allergic rhinitis, unspecified: Secondary | ICD-10-CM

## 2019-10-07 ENCOUNTER — Other Ambulatory Visit: Payer: Self-pay | Admitting: Physician Assistant

## 2019-10-07 DIAGNOSIS — E119 Type 2 diabetes mellitus without complications: Secondary | ICD-10-CM

## 2019-10-07 DIAGNOSIS — E1165 Type 2 diabetes mellitus with hyperglycemia: Secondary | ICD-10-CM

## 2019-10-07 DIAGNOSIS — E039 Hypothyroidism, unspecified: Secondary | ICD-10-CM

## 2019-10-12 ENCOUNTER — Encounter: Payer: Self-pay | Admitting: Physician Assistant

## 2019-10-12 DIAGNOSIS — E1165 Type 2 diabetes mellitus with hyperglycemia: Secondary | ICD-10-CM

## 2019-10-12 DIAGNOSIS — E119 Type 2 diabetes mellitus without complications: Secondary | ICD-10-CM

## 2019-10-12 NOTE — Telephone Encounter (Signed)
Ok to give 30 day of medications. No further medication without follow-up. There was a note placed on last Rx for Jardiance about needing to schedule a follow-up for further refills which he did not do.

## 2019-10-13 ENCOUNTER — Ambulatory Visit (INDEPENDENT_AMBULATORY_CARE_PROVIDER_SITE_OTHER): Payer: BC Managed Care – PPO | Admitting: *Deleted

## 2019-10-13 DIAGNOSIS — J309 Allergic rhinitis, unspecified: Secondary | ICD-10-CM | POA: Diagnosis not present

## 2019-10-16 ENCOUNTER — Telehealth: Payer: Self-pay | Admitting: Physician Assistant

## 2019-10-16 DIAGNOSIS — E785 Hyperlipidemia, unspecified: Secondary | ICD-10-CM

## 2019-10-16 MED ORDER — EMPAGLIFLOZIN 10 MG PO TABS: 10.0000 mg | ORAL_TABLET | Freq: Every day | ORAL | 0 refills | Status: DC

## 2019-10-18 ENCOUNTER — Other Ambulatory Visit: Payer: Self-pay | Admitting: Emergency Medicine

## 2019-10-18 DIAGNOSIS — E039 Hypothyroidism, unspecified: Secondary | ICD-10-CM

## 2019-10-18 DIAGNOSIS — E1165 Type 2 diabetes mellitus with hyperglycemia: Secondary | ICD-10-CM

## 2019-10-18 MED ORDER — LEVOTHYROXINE SODIUM 150 MCG PO TABS: 150.0000 ug | ORAL_TABLET | Freq: Every day | ORAL | 0 refills | Status: DC

## 2019-10-18 MED ORDER — METFORMIN HCL 500 MG PO TABS: ORAL_TABLET | ORAL | 0 refills | Status: DC

## 2019-10-20 MED ORDER — PRAVASTATIN SODIUM 80 MG PO TABS: 80.0000 mg | ORAL_TABLET | Freq: Every day | ORAL | 1 refills | Status: DC

## 2019-10-20 NOTE — Telephone Encounter (Signed)
Requested drug refills are authorized, however, the patient needs further evaluation and/or laboratory testing before further refills are given. Patient is scheduled for 11/01/2019

## 2019-10-20 NOTE — Telephone Encounter (Signed)
Patient called back and states that he still needs his prevastatin called in - please advise.

## 2019-10-20 NOTE — Addendum Note (Signed)
Addended by: Leonidas Romberg on: 10/20/2019 01:17 PM   Modules accepted: Orders

## 2019-11-01 ENCOUNTER — Encounter: Payer: Self-pay | Admitting: Physician Assistant

## 2019-11-01 ENCOUNTER — Ambulatory Visit (INDEPENDENT_AMBULATORY_CARE_PROVIDER_SITE_OTHER): Payer: BC Managed Care – PPO | Admitting: Physician Assistant

## 2019-11-01 ENCOUNTER — Other Ambulatory Visit: Payer: Self-pay

## 2019-11-01 ENCOUNTER — Other Ambulatory Visit (INDEPENDENT_AMBULATORY_CARE_PROVIDER_SITE_OTHER): Payer: BC Managed Care – PPO

## 2019-11-01 VITALS — BP 118/80 | HR 76 | Temp 98.0°F | Resp 16 | Ht 69.0 in | Wt 210.0 lb

## 2019-11-01 DIAGNOSIS — E6609 Other obesity due to excess calories: Secondary | ICD-10-CM

## 2019-11-01 DIAGNOSIS — E039 Hypothyroidism, unspecified: Secondary | ICD-10-CM | POA: Diagnosis not present

## 2019-11-01 DIAGNOSIS — L409 Psoriasis, unspecified: Secondary | ICD-10-CM

## 2019-11-01 DIAGNOSIS — Z125 Encounter for screening for malignant neoplasm of prostate: Secondary | ICD-10-CM

## 2019-11-01 DIAGNOSIS — E119 Type 2 diabetes mellitus without complications: Secondary | ICD-10-CM

## 2019-11-01 DIAGNOSIS — E785 Hyperlipidemia, unspecified: Secondary | ICD-10-CM

## 2019-11-01 DIAGNOSIS — Z1211 Encounter for screening for malignant neoplasm of colon: Secondary | ICD-10-CM

## 2019-11-01 DIAGNOSIS — Z23 Encounter for immunization: Secondary | ICD-10-CM | POA: Diagnosis not present

## 2019-11-01 LAB — LIPID PANEL
Cholesterol: 181 mg/dL (ref 0–200)
HDL: 44 mg/dL (ref >39.00)
LDL Cholesterol: 107 mg/dL — ABNORMAL HIGH (ref 0–99)
NonHDL: 136.97
Total CHOL/HDL Ratio: 4
Triglycerides: 148 mg/dL (ref 0.0–149.0)
VLDL: 29.6 mg/dL (ref 0.0–40.0)

## 2019-11-01 LAB — CBC WITH DIFFERENTIAL/PLATELET
Basophils Absolute: 0.1 10*3/uL (ref 0.0–0.1)
Basophils Relative: 1.7 % (ref 0.0–3.0)
Eosinophils Absolute: 0.2 10*3/uL (ref 0.0–0.7)
Eosinophils Relative: 2.8 % (ref 0.0–5.0)
HCT: 43.9 % (ref 39.0–52.0)
Hemoglobin: 14.8 g/dL (ref 13.0–17.0)
Lymphocytes Relative: 25.8 % (ref 12.0–46.0)
Lymphs Abs: 1.9 10*3/uL (ref 0.7–4.0)
MCHC: 33.7 g/dL (ref 30.0–36.0)
MCV: 82.7 fl (ref 78.0–100.0)
Monocytes Absolute: 0.5 10*3/uL (ref 0.1–1.0)
Monocytes Relative: 6.1 % (ref 3.0–12.0)
Neutro Abs: 4.7 10*3/uL (ref 1.4–7.7)
Neutrophils Relative %: 63.6 % (ref 43.0–77.0)
Platelets: 264 10*3/uL (ref 150.0–400.0)
RBC: 5.31 Mil/uL (ref 4.22–5.81)
RDW: 14.6 % (ref 11.5–15.5)
WBC: 7.4 10*3/uL (ref 4.0–10.5)

## 2019-11-01 LAB — COMPREHENSIVE METABOLIC PANEL
ALT: 16 U/L (ref 0–53)
AST: 13 U/L (ref 0–37)
Albumin: 4.7 g/dL (ref 3.5–5.2)
Alkaline Phosphatase: 74 U/L (ref 39–117)
BUN: 17 mg/dL (ref 6–23)
CO2: 28 mEq/L (ref 19–32)
Calcium: 9.1 mg/dL (ref 8.4–10.5)
Chloride: 91 mEq/L — ABNORMAL LOW (ref 96–112)
Creatinine, Ser: 0.76 mg/dL (ref 0.40–1.50)
GFR: 98.82 mL/min (ref >60.00)
Glucose, Bld: 80 mg/dL (ref 70–99)
Potassium: 3.5 mEq/L (ref 3.5–5.1)
Sodium: 129 mEq/L — ABNORMAL LOW (ref 135–145)
Total Bilirubin: 0.6 mg/dL (ref 0.2–1.2)
Total Protein: 7.1 g/dL (ref 6.0–8.3)

## 2019-11-01 LAB — TSH: TSH: 27.43 u[IU]/mL — ABNORMAL HIGH (ref 0.35–4.50)

## 2019-11-01 LAB — MICROALBUMIN / CREATININE URINE RATIO
Creatinine,U: 52.8 mg/dL
Microalb Creat Ratio: 2.3 mg/g (ref 0.0–30.0)
Microalb, Ur: 1.2 mg/dL (ref 0.0–1.9)

## 2019-11-01 LAB — HEMOGLOBIN A1C: Hgb A1c MFr Bld: 7.9 % — ABNORMAL HIGH (ref 4.6–6.5)

## 2019-11-01 LAB — PSA: PSA: 1.35 ng/mL (ref 0.10–4.00)

## 2019-11-01 NOTE — Patient Instructions (Signed)
Please go to the lab for blood work.   Our office will call you with your results unless you have chosen to receive results via MyChart.  If your blood work is normal we will follow-up each year for physicals and as scheduled for chronic medical problems.  If anything is abnormal we will treat accordingly and get you in for a follow-up.  I have drawn the orders for your dermatologist and will forward to them once results are available.   You will be contacted to schedule an appointment for your screening colonoscopy.   Diabetes Mellitus and Exercise Exercising regularly is important for your overall health, especially when you have diabetes (diabetes mellitus). Exercising is not only about losing weight. It has many other health benefits, such as increasing muscle strength and bone density and reducing body fat and stress. This leads to improved fitness, flexibility, and endurance, all of which result in better overall health. Exercise has additional benefits for people with diabetes, including:  Reducing appetite.  Helping to lower and control blood glucose.  Lowering blood pressure.  Helping to control amounts of fatty substances (lipids) in the blood, such as cholesterol and triglycerides.  Helping the body to respond better to insulin (improving insulin sensitivity).  Reducing how much insulin the body needs.  Decreasing the risk for heart disease by: ? Lowering cholesterol and triglyceride levels. ? Increasing the levels of good cholesterol. ? Lowering blood glucose levels. What is my activity plan? Your health care provider or certified diabetes educator can help you make a plan for the type and frequency of exercise (activity plan) that works for you. Make sure that you:  Do at least 150 minutes of moderate-intensity or vigorous-intensity exercise each week. This could be brisk walking, biking, or water aerobics. ? Do stretching and strength exercises, such as yoga or  weightlifting, at least 2 times a week. ? Spread out your activity over at least 3 days of the week.  Get some form of physical activity every day. ? Do not go more than 2 days in a row without some kind of physical activity. ? Avoid being inactive for more than 30 minutes at a time. Take frequent breaks to walk or stretch.  Choose a type of exercise or activity that you enjoy, and set realistic goals.  Start slowly, and gradually increase the intensity of your exercise over time. What do I need to know about managing my diabetes?   Check your blood glucose before and after exercising. ? If your blood glucose is 240 mg/dL (13.3 mmol/L) or higher before you exercise, check your urine for ketones. If you have ketones in your urine, do not exercise until your blood glucose returns to normal. ? If your blood glucose is 100 mg/dL (5.6 mmol/L) or lower, eat a snack containing 15-20 grams of carbohydrate. Check your blood glucose 15 minutes after the snack to make sure that your level is above 100 mg/dL (5.6 mmol/L) before you start your exercise.  Know the symptoms of low blood glucose (hypoglycemia) and how to treat it. Your risk for hypoglycemia increases during and after exercise. Common symptoms of hypoglycemia can include: ? Hunger. ? Anxiety. ? Sweating and feeling clammy. ? Confusion. ? Dizziness or feeling light-headed. ? Increased heart rate or palpitations. ? Blurry vision. ? Tingling or numbness around the mouth, lips, or tongue. ? Tremors or shakes. ? Irritability.  Keep a rapid-acting carbohydrate snack available before, during, and after exercise to help prevent or treat hypoglycemia.  Avoid injecting insulin into areas of the body that are going to be exercised. For example, avoid injecting insulin into: ? The arms, when playing tennis. ? The legs, when jogging.  Keep records of your exercise habits. Doing this can help you and your health care provider adjust your  diabetes management plan as needed. Write down: ? Food that you eat before and after you exercise. ? Blood glucose levels before and after you exercise. ? The type and amount of exercise you have done. ? When your insulin is expected to peak, if you use insulin. Avoid exercising at times when your insulin is peaking.  When you start a new exercise or activity, work with your health care provider to make sure the activity is safe for you, and to adjust your insulin, medicines, or food intake as needed.  Drink plenty of water while you exercise to prevent dehydration or heat stroke. Drink enough fluid to keep your urine clear or pale yellow. Summary  Exercising regularly is important for your overall health, especially when you have diabetes (diabetes mellitus).  Exercising has many health benefits, such as increasing muscle strength and bone density and reducing body fat and stress.  Your health care provider or certified diabetes educator can help you make a plan for the type and frequency of exercise (activity plan) that works for you.  When you start a new exercise or activity, work with your health care provider to make sure the activity is safe for you, and to adjust your insulin, medicines, or food intake as needed. This information is not intended to replace advice given to you by your health care provider. Make sure you discuss any questions you have with your health care provider. Document Revised: 07/23/2016 Document Reviewed: 06/10/2015 Elsevier Patient Education  [place].

## 2019-11-01 NOTE — Progress Notes (Signed)
History of Present Illness: Patient is a 59 y.o. male who presents to clinic today for follow-up of Diabetes Mellitus II, previously controlled without complication.  Patient currently on medication regimen of Metformin 1000 mg BID and Jardiance 10 mg daily.  Endorses taking medications as directed. And tolerating well. Endorses keeping well-balanced diet with low car intake. Limits to once weekly carb-rich meal. Stays well-hydrated.  Denies polyuria, polydipsia, vision changes or numbness/tingling of hands or feet. Is not checking blood glucose as directed.   Patient also with hyperlipidemia associated with DM II.  Is currently on Pravachol 80 mg taking daily as directed. Also continues to take his levothyroxine 150 mcg daily for hypothyroidism.   Latest Maintenance: A1C --  Lab Results  Component Value Date   HGBA1C 7.4 (H) 12/28/2018   Diabetic Eye Exam -- Endorses 12/2018. Will obtain records from [facility] eye care.  Urine Microalbumin -- Due.  Foot Exam -- UTD. Denies concerns today.   Past Medical History:  Diagnosis Date  . Allergy   . Anxiety   . Arthritis   . Collapsed lung   . Diabetes mellitus without complication ([place])   . Eczema   . GERD (gastroesophageal reflux disease)   . History of chickenpox   . Hyperlipemia   . Hypothyroidism   . PONV (postoperative nausea and vomiting)   . Psoriasis   . Uncontrolled diabetes mellitus type 2 without complications 07/16/1698    Current Outpatient Medications on File Prior to Visit  Medication Sig Dispense Refill  . betamethasone dipropionate 0.05 % cream Apply 1 application topically 2 (two) times daily.    . empagliflozin (JARDIANCE) 10 MG TABS tablet Take 1 tablet (10 mg total) by mouth daily. Need follow-up for further refills. Please schedule appt. 30 tablet 0  . fluocinonide (LIDEX) 0.05 % external solution     . fluticasone (FLONASE) 50 MCG/ACT nasal spray Place 2 sprays into both nostrils daily. 16 g 5  . glucose blood  ([employer] TEST STRIPS) test strip Please check blood sugar once a day due to fluctuating blood sugars.    [name] levothyroxine (SYNTHROID) 150 MCG tablet Take 1 tablet (150 mcg total) by mouth daily. Due for diabetic follow up 30 tablet 0  . metFORMIN (GLUCOPHAGE) 500 MG tablet TAKE 2 TABLETS BY MOUTH TWICE A DAY WITH MEALS. Due for diabetic follow up 120 tablet 0  . montelukast (SINGULAIR) 10 MG tablet TAKE 1 TABLET BY MOUTH EVERYDAY AT BEDTIME 30 tablet 5  . Multiple Vitamin (MULTIVITAMIN WITH MINERALS) TABS tablet Take 1 tablet by mouth daily.    [name] omeprazole (PRILOSEC) 20 MG capsule Take 20 mg by mouth daily as needed (acid reflux).   5  . pravastatin (PRAVACHOL) 80 MG tablet Take 1 tablet (80 mg total) by mouth daily. 30 tablet 1  . Triamcinolone Acetonide 0.025 % LOTN      No current facility-administered medications on file prior to visit.    No Known Allergies  Family History  Problem Relation Age of Onset  . Allergic rhinitis Mother   . Lung cancer Mother   . Asthma Sister   . Cancer Sister        Skin    Social History   Socioeconomic History  . Marital status: Married    Spouse name: Not on file  . Number of children: Not on file  . Years of education: Not on file  . Highest education level: Not on file  Occupational History  . Not on  file  Tobacco Use  . Smoking status: Former Smoker    Years: 15.00    Types: Cigarettes    Quit date: 1996    Years since quitting: 25.8  . Smokeless tobacco: Former Network engineer  . Vaping Use: Never used  Substance and Sexual Activity  . Alcohol use: No  . Drug use: No  . Sexual activity: Not on file  Other Topics Concern  . Not on file  Social History Narrative  . Not on file   Social Determinants of Health   Financial Resource Strain:   . Difficulty of Paying Living Expenses: Not on file  Food Insecurity:   . Worried About Charity fundraiser in the Last Year: Not on file  . Ran Out of Food in the Last Year: Not on  file  Transportation Needs:   . Lack of Transportation (Medical): Not on file  . Lack of Transportation (Non-Medical): Not on file  Physical Activity:   . Days of Exercise per Week: Not on file  . Minutes of Exercise per Session: Not on file  Stress:   . Feeling of Stress : Not on file  Social Connections:   . Frequency of Communication with Friends and Family: Not on file  . Frequency of Social Gatherings with Friends and Family: Not on file  . Attends Religious Services: Not on file  . Active Member of Clubs or Organizations: Not on file  . Attends Archivist Meetings: Not on file  . Marital Status: Not on file   Review of Systems: Pertinent ROS are listed in HPI  Physical Examination: BP 118/80   Pulse 76   Temp 98 F (36.7 C) (Temporal)   Resp 16   Ht 5\' 9"  (1.753 m)   Wt 210 lb (95.3 kg)   SpO2 98%   BMI 31.01 kg/m  General appearance: alert, cooperative, appears stated age and no distress Lungs: clear to auscultation bilaterally Heart: regular rate and rhythm, S1, S2 normal, no murmur, click, rub or gallop Extremities: extremities normal, atraumatic, no cyanosis or edema Pulses: 2+ and symmetric Lymph nodes: Cervical, supraclavicular, and axillary nodes normal. Neurologic: Alert and oriented X 3, normal strength and tone. Normal symmetric reflexes. Normal coordination and gait   Assessment/Plan: 1. Controlled type 2 diabetes mellitus without complication, without long-term current use of insulin (HCC) Taking medications as directed. Dietary and exercise recommendations reviewed. Will update labs today to assess current glycemic control. Will adjust medications accordingly.  - Comprehensive metabolic panel - Lipid panel - Hemoglobin A1c - Urine Microalbumin w/creat. ratio  2. Hyperlipidemia, unspecified hyperlipidemia type Taking Pravachol as directed. Repeat fasting lipids and LFTs today. - Comprehensive metabolic panel - Lipid panel  3. Non morbid  obesity due to excess calories Dietary and exercise recommendations reviewed. Will moniotir.  - Lipid panel  4. Prostate cancer screening He indicates understanding of the limitations of this screening test and wishes to proceed with screening PSA testing. - PSA  5. Colon cancer screening Due for repeat colonoscopy. - Ambulatory referral to Gastroenterology  6. Hypothyroidism, unspecified type Taking levothyroxine daily as directed. Repeat TSH today.  - TSH  7. Psoriasis Managed by Dermatology. Has list of orders needed to be drawn before starting new medication. Will obtain today and forward to specialist.  - CBC w/Diff - Hepatitis B Surface AntiGEN - Hepatitis C Antibody - QuantiFERON-TB Gold Plus   This visit occurred during the SARS-CoV-2 public health emergency.  Safety protocols were  in place, including screening questions prior to the visit, additional usage of staff PPE, and extensive cleaning of exam room while observing appropriate contact time as indicated for disinfecting solutions.

## 2019-11-01 NOTE — Addendum Note (Signed)
Addended by: Fritz Pickerel on: 11/01/2019 09:36 AM   Modules accepted: Orders

## 2019-11-03 ENCOUNTER — Other Ambulatory Visit: Payer: Self-pay | Admitting: Emergency Medicine

## 2019-11-03 DIAGNOSIS — E785 Hyperlipidemia, unspecified: Secondary | ICD-10-CM

## 2019-11-03 DIAGNOSIS — E119 Type 2 diabetes mellitus without complications: Secondary | ICD-10-CM

## 2019-11-03 DIAGNOSIS — E039 Hypothyroidism, unspecified: Secondary | ICD-10-CM

## 2019-11-03 LAB — QUANTIFERON-TB GOLD PLUS
Mitogen-NIL: >10 IU/mL
NIL: 0.02 IU/mL
QuantiFERON-TB Gold Plus: NEGATIVE
TB1-NIL: 0 IU/mL
TB2-NIL: 0 IU/mL

## 2019-11-03 LAB — HEPATITIS C ANTIBODY
Hepatitis C Ab: NONREACTIVE
SIGNAL TO CUT-OFF: 0.01 (ref <1.00)

## 2019-11-03 LAB — HEPATITIS B SURFACE ANTIGEN: Hepatitis B Surface Ag: NONREACTIVE

## 2019-11-03 MED ORDER — EMPAGLIFLOZIN 25 MG PO TABS: 25.0000 mg | ORAL_TABLET | Freq: Every day | ORAL | 1 refills | Status: DC

## 2019-11-03 MED ORDER — LEVOTHYROXINE SODIUM 175 MCG PO TABS: 175.0000 ug | ORAL_TABLET | Freq: Every day | ORAL | 1 refills | Status: DC

## 2019-11-03 MED ORDER — ROSUVASTATIN CALCIUM 20 MG PO TABS: 20.0000 mg | ORAL_TABLET | Freq: Every day | ORAL | 1 refills | Status: DC

## 2019-11-09 ENCOUNTER — Ambulatory Visit (INDEPENDENT_AMBULATORY_CARE_PROVIDER_SITE_OTHER): Payer: BC Managed Care – PPO | Admitting: *Deleted

## 2019-11-09 DIAGNOSIS — J309 Allergic rhinitis, unspecified: Secondary | ICD-10-CM

## 2019-11-14 NOTE — Progress Notes (Signed)
Follow Up Note  RE: Shaun Velasquez MRN: [id number] DOB: Apr 27, 1960 Date of Office Visit: 11/15/2019  Referring provider: Delorse Limber Primary care provider: Brunetta Jeans, PA-C  Chief Complaint: Allergic Rhinitis  (doing good)  History of Present Illness: I had the pleasure of seeing Shaun Velasquez for a follow up visit at the [facility] on 11/15/2019. He is a 59 y.o. male, who is being followed for asthma, allergic rhinitis on AIT. His previous allergy office visit was on 11/14/2018 with Dr. Maudie Mercury. Today is a regular follow up visit.  Mild persistent asthma Sometimes has a cough during the winter months and it takes awhile for it to go away. This happened last year as well after coming down with an URI.  Otherwise no issues with his breathing.   Perennial and seasonal allergic rhinitis Doing well with allergy injections and noticed improvement.  Not taking any daily medications.  Patient's psoriasis has been flaring up and apparently he is going to start on a new injectable medications by dermatology for this.  Assessment and Plan: Shaun Velasquez is a 59 y.o. male with: Mild persistent asthma without complication Post infectious cough lasting 1-2 months at times always in the winter months.  . If the coughing returns let us know.  . Daily controller medication(s): NONE . During upper respiratory infections/asthma flares: Start Alvesco 117mcg 1 puffs twice a day for 2 weeks and rinse mouth afterwards. Sample given. . May use albuterol rescue inhaler 2 puffs every 4 to 6 hours as needed for shortness of breath, chest tightness, coughing, and wheezing. May use albuterol rescue inhaler 2 puffs 5 to 15 minutes prior to strenuous physical activities. Monitor frequency of use.   Perennial and seasonal allergic rhinitis Past history - started allergy injections on 10/29/2015 (Mold-Cat-Dog & Grass-Weed-Tree). Interim history - significant improvement especially in the  spring with injections. Not taking any other medications.  Continue allergy injections - every 4 weeks. Discuss stopping at next visit.   May use Singulair 10mg  daily (montelukast) as needed - this helps with asthma and allergies. Start when the coughing starts again.  May use over the counter antihistamines such as Zyrtec (cetirizine), Claritin (loratadine), Allegra (fexofenadine), or Xyzal (levocetirizine) daily as needed. May take twice a day during flares.  May use Flonase (fluticasone) nasal spray 1 spray per nostril twice a day as needed for nasal congestion.   Return in about 1 year (around 11/14/2020).  Diagnostics: Spirometry:  Tracings reviewed. His effort: Good reproducible efforts. FVC: 4.47L FEV1: 3.42L, 99% predicted FEV1/FVC ratio: 77% Interpretation: Spirometry consistent with normal pattern.  Please see scanned spirometry results for details.  Medication List:  Current Outpatient Medications  Medication Sig Dispense Refill  . betamethasone dipropionate 0.05 % cream Apply 1 application topically 2 (two) times daily.    . empagliflozin (JARDIANCE) 25 MG TABS tablet Take 1 tablet (25 mg total) by mouth daily. 90 tablet 1  . fluocinonide (LIDEX) 0.05 % external solution     . fluticasone (FLONASE) 50 MCG/ACT nasal spray Place 2 sprays into both nostrils daily. 16 g 5  . glucose blood ([employer] TEST STRIPS) test strip Please check blood sugar once a day due to fluctuating blood sugars.    [name] levothyroxine (SYNTHROID) 175 MCG tablet Take 1 tablet (175 mcg total) by mouth daily before breakfast. 90 tablet 1  . metFORMIN (GLUCOPHAGE) 500 MG tablet TAKE 2 TABLETS BY MOUTH TWICE A DAY WITH MEALS. Due for diabetic  follow up 120 tablet 0  . montelukast (SINGULAIR) 10 MG tablet TAKE 1 TABLET BY MOUTH EVERYDAY AT BEDTIME 30 tablet 5  . Multiple Vitamin (MULTIVITAMIN WITH MINERALS) TABS tablet Take 1 tablet by mouth daily.    . rosuvastatin (CRESTOR) 20 MG tablet Take 1 tablet (20 mg  total) by mouth daily. 90 tablet 1  . Triamcinolone Acetonide 0.025 % LOTN      No current facility-administered medications for this visit.   Allergies: No Known Allergies I reviewed his past medical history, social history, family history, and environmental history and no significant changes have been reported from his previous visit.  Review of Systems  Constitutional: Negative for appetite change, chills, fever and unexpected weight change.  HENT: Negative for congestion and rhinorrhea.   Eyes: Negative for itching.  Respiratory: Negative for cough, chest tightness, shortness of breath and wheezing.   Gastrointestinal: Negative for abdominal pain.  Skin: Negative for rash.  Allergic/Immunologic: Positive for environmental allergies.  Neurological: Negative for headaches.   Objective: BP 124/78 (BP Location: Left Arm, Patient Position: Sitting, Cuff Size: Normal)   Pulse 80   Ht 5' 9.2" (1.758 m)   Wt 207 lb (93.9 kg)   SpO2 98%   BMI 30.39 kg/m  Body mass index is 30.39 kg/m. Physical Exam Vitals and nursing note reviewed.  Constitutional:      Appearance: Normal appearance. He is well-developed.  HENT:     Head: Normocephalic and atraumatic.     Right Ear: Tympanic membrane and external ear normal.     Left Ear: Tympanic membrane and external ear normal.     Nose: Nose normal.     Mouth/Throat:     Mouth: Mucous membranes are moist.     Pharynx: Oropharynx is clear.  Eyes:     Conjunctiva/sclera: Conjunctivae normal.  Cardiovascular:     Rate and Rhythm: Normal rate and regular rhythm.     Heart sounds: Normal heart sounds. No murmur heard.  No friction rub. No gallop.   Pulmonary:     Effort: Pulmonary effort is normal.     Breath sounds: Normal breath sounds. No wheezing or rales.  Musculoskeletal:     Cervical back: Neck supple.  Skin:    General: Skin is warm.     Findings: No rash.  Neurological:     Mental Status: He is alert and oriented to person,  place, and time.  Psychiatric:        Behavior: Behavior normal.    Previous notes and tests were reviewed. The plan was reviewed with the patient/family, and all questions/concerned were addressed.  It was my pleasure to see Calum today and participate in his care. Please feel free to contact me with any questions or concerns.  Sincerely,  Rexene Alberts, DO Allergy & Immunology  [facility] office: [place] office: [phone]

## 2019-11-15 ENCOUNTER — Ambulatory Visit (INDEPENDENT_AMBULATORY_CARE_PROVIDER_SITE_OTHER): Payer: BC Managed Care – PPO | Admitting: Allergy

## 2019-11-15 ENCOUNTER — Encounter: Payer: Self-pay | Admitting: Allergy

## 2019-11-15 ENCOUNTER — Other Ambulatory Visit: Payer: Self-pay

## 2019-11-15 VITALS — BP 124/78 | HR 80 | Ht 69.2 in | Wt 207.0 lb

## 2019-11-15 DIAGNOSIS — J3089 Other allergic rhinitis: Secondary | ICD-10-CM

## 2019-11-15 DIAGNOSIS — J4530 Mild persistent asthma, uncomplicated: Secondary | ICD-10-CM

## 2019-11-15 NOTE — Patient Instructions (Addendum)
Asthma: . If the coughing returns let us know.  . Normal breathing test today. . Daily controller medication(s): NONE . During upper respiratory infections/asthma flares: Start Alvesco 144mcg 1 puffs twice a day for 2 weeks and rinse mouth afterwards. . May use albuterol rescue inhaler 2 puffs every 4 to 6 hours as needed for shortness of breath, chest tightness, coughing, and wheezing. May use albuterol rescue inhaler 2 puffs 5 to 15 minutes prior to strenuous physical activities. Monitor frequency of use.  . Asthma control goals:  o Full participation in all desired activities (may need albuterol before activity) o Albuterol use two times or less a week on average (not counting use with activity) o Cough interfering with sleep two times or less a month o Oral steroids no more than once a year o No hospitalizations  Environmental allergies:  Continue allergy injections.  May use Singulair 10mg  daily (montelukast) as needed - this helps with asthma and allergies. Start when the coughing starts again.  May use over the counter antihistamines such as Zyrtec (cetirizine), Claritin (loratadine), Allegra (fexofenadine), or Xyzal (levocetirizine) daily as needed. May take twice a day during flares.  May use Flonase (fluticasone) nasal spray 1 spray per nostril twice a day as needed for nasal congestion.   Follow up in 1 year or sooner if needed.

## 2019-11-15 NOTE — Assessment & Plan Note (Addendum)
Post infectious cough lasting 1-2 months at times always in the winter months.  . If the coughing returns let us know.  . Daily controller medication(s): NONE . During upper respiratory infections/asthma flares: Start Alvesco 191mcg 1 puffs twice a day for 2 weeks and rinse mouth afterwards. Sample given. . May use albuterol rescue inhaler 2 puffs every 4 to 6 hours as needed for shortness of breath, chest tightness, coughing, and wheezing. May use albuterol rescue inhaler 2 puffs 5 to 15 minutes prior to strenuous physical activities. Monitor frequency of use.

## 2019-11-15 NOTE — Assessment & Plan Note (Signed)
Past history - started allergy injections on 10/29/2015 (Mold-Cat-Dog & Grass-Weed-Tree). Interim history - significant improvement especially in the spring with injections. Not taking any other medications.  Continue allergy injections - every 4 weeks. Discuss stopping at next visit.   May use Singulair 10mg  daily (montelukast) as needed - this helps with asthma and allergies. Start when the coughing starts again.  May use over the counter antihistamines such as Zyrtec (cetirizine), Claritin (loratadine), Allegra (fexofenadine), or Xyzal (levocetirizine) daily as needed. May take twice a day during flares.  May use Flonase (fluticasone) nasal spray 1 spray per nostril twice a day as needed for nasal congestion.

## 2019-11-16 ENCOUNTER — Other Ambulatory Visit: Payer: Self-pay | Admitting: Physician Assistant

## 2019-11-16 ENCOUNTER — Encounter: Payer: Self-pay | Admitting: Gastroenterology

## 2019-11-16 DIAGNOSIS — E039 Hypothyroidism, unspecified: Secondary | ICD-10-CM

## 2019-11-16 DIAGNOSIS — E119 Type 2 diabetes mellitus without complications: Secondary | ICD-10-CM

## 2019-11-16 DIAGNOSIS — E1165 Type 2 diabetes mellitus with hyperglycemia: Secondary | ICD-10-CM

## 2019-12-13 ENCOUNTER — Ambulatory Visit (INDEPENDENT_AMBULATORY_CARE_PROVIDER_SITE_OTHER): Payer: BC Managed Care – PPO | Admitting: *Deleted

## 2019-12-13 DIAGNOSIS — J309 Allergic rhinitis, unspecified: Secondary | ICD-10-CM

## 2019-12-20 ENCOUNTER — Other Ambulatory Visit: Payer: Self-pay | Admitting: Allergy

## 2019-12-21 ENCOUNTER — Other Ambulatory Visit: Payer: Self-pay

## 2019-12-21 ENCOUNTER — Ambulatory Visit (AMBULATORY_SURGERY_CENTER): Payer: Self-pay

## 2019-12-21 VITALS — Ht 69.0 in | Wt 209.0 lb

## 2019-12-21 DIAGNOSIS — Z1211 Encounter for screening for malignant neoplasm of colon: Secondary | ICD-10-CM

## 2019-12-21 MED ORDER — SUTAB 1479-225-188 MG PO TABS: 12.0000 | ORAL_TABLET | ORAL | 0 refills | Status: DC

## 2019-12-21 NOTE — Progress Notes (Signed)
No egg or soy allergy known to patient  No issues with past sedation with any surgeries or procedures no intubation problems in the past  No FH of Malignant Hyperthermia No diet pills per patient No home 02 use per patient  No blood thinners per patient  Pt denies issues with constipation  No A fib or A flutter  EMMI video to pt or via [place] 19 guidelines implemented in PV today with Pt and RN   SUTAB Coupon given to pt in PV today , Code to Pharmacy   Due to the COVID-19 pandemic we are asking patients to follow these guidelines. Please only bring one care partner. Please be aware that your care partner may wait in the car in the parking lot or if they feel like they will be too hot to wait in the car, they may wait in the lobby on the 4th floor. All care partners are required to wear a mask the entire time (we do not have any that we can provide them), they need to practice social distancing, and we will do a Covid check for all patient's and care partners when you arrive. Also we will check their temperature and your temperature. If the care partner waits in their car they need to stay in the parking lot the entire time and we will call them on their cell phone when the patient is ready for discharge so they can bring the car to the front of the building. Also all patient's will need to wear a mask into building.

## 2020-01-04 ENCOUNTER — Encounter: Payer: Self-pay | Admitting: Gastroenterology

## 2020-01-04 ENCOUNTER — Ambulatory Visit (AMBULATORY_SURGERY_CENTER): Payer: BC Managed Care – PPO | Admitting: Gastroenterology

## 2020-01-04 ENCOUNTER — Other Ambulatory Visit: Payer: Self-pay

## 2020-01-04 VITALS — BP 95/64 | HR 71 | Temp 97.1°F | Resp 18 | Ht 69.0 in | Wt 209.0 lb

## 2020-01-04 DIAGNOSIS — K641 Second degree hemorrhoids: Secondary | ICD-10-CM

## 2020-01-04 DIAGNOSIS — D120 Benign neoplasm of cecum: Secondary | ICD-10-CM

## 2020-01-04 DIAGNOSIS — D125 Benign neoplasm of sigmoid colon: Secondary | ICD-10-CM

## 2020-01-04 DIAGNOSIS — Z1211 Encounter for screening for malignant neoplasm of colon: Secondary | ICD-10-CM | POA: Diagnosis present

## 2020-01-04 MED ORDER — SODIUM CHLORIDE 0.9 % IV SOLN: 500.0000 mL | Freq: Once | INTRAVENOUS | Status: DC

## 2020-01-04 NOTE — Patient Instructions (Signed)
Handout given:  Polyps, Hemorrhoids Resume previous diet Continue present medication Await pathology results Use fiber ie:  Citrucel, fibercon, metamucil  YOU HAD AN ENDOSCOPIC PROCEDURE TODAY AT [facility]:   Refer to the procedure report that was given to you for any specific questions about what was found during the examination.  If the procedure report does not answer your questions, please call your gastroenterologist to clarify.  If you requested that your care partner not be given the details of your procedure findings, then the procedure report has been included in a sealed envelope for you to review at your convenience later.  YOU SHOULD EXPECT: Some feelings of bloating in the abdomen. Passage of more gas than usual.  Walking can help get rid of the air that was put into your GI tract during the procedure and reduce the bloating. If you had a lower endoscopy (such as a colonoscopy or flexible sigmoidoscopy) you may notice spotting of blood in your stool or on the toilet paper. If you underwent a bowel prep for your procedure, you may not have a normal bowel movement for a few days.  Please Note:  You might notice some irritation and congestion in your nose or some drainage.  This is from the oxygen used during your procedure.  There is no need for concern and it should clear up in a day or so.  SYMPTOMS TO REPORT IMMEDIATELY:   Following lower endoscopy (colonoscopy or flexible sigmoidoscopy):  Excessive amounts of blood in the stool  Significant tenderness or worsening of abdominal pains  Swelling of the abdomen that is new, acute  Fever of 100F or higher  For urgent or emergent issues, a gastroenterologist can be reached at any hour by calling [phone]. Do not use MyChart messaging for urgent concerns.   DIET:  We do recommend a small meal at first, but then you may proceed to your regular diet.  Drink plenty of fluids but you should avoid alcoholic  beverages for 24 hours.  ACTIVITY:  You should plan to take it easy for the rest of today and you should NOT DRIVE or use heavy machinery until tomorrow (because of the sedation medicines used during the test).    FOLLOW UP: Our staff will call the number listed on your records 48-72 hours following your procedure to check on you and address any questions or concerns that you may have regarding the information given to you following your procedure. If we do not reach you, we will leave a message.  We will attempt to reach you two times.  During this call, we will ask if you have developed any symptoms of COVID 19. If you develop any symptoms (ie: fever, flu-like symptoms, shortness of breath, cough etc.) before then, please call [phone].  If you test positive for Covid 19 in the 2 weeks post procedure, please call and report this information to [place].    If any biopsies were taken you will be contacted by phone or by letter within the next 1-3 weeks.  Please call us at [phone] if you have not heard about the biopsies in 3 weeks.   SIGNATURES/CONFIDENTIALITY: You and/or your care partner have signed paperwork which will be entered into your electronic medical record.  These signatures attest to the fact that that the information above on your After Visit Summary has been reviewed and is understood.  Full responsibility of the confidentiality of this discharge information lies with you and/or your  care-partner.

## 2020-01-04 NOTE — Progress Notes (Signed)
Vitals-CW  Pt's states no medical or surgical changes since previsit or office visit. 

## 2020-01-04 NOTE — Progress Notes (Signed)
Called to room to assist during endoscopic procedure.  Patient ID and intended procedure confirmed with present staff. Received instructions for my participation in the procedure from the performing physician.  

## 2020-01-04 NOTE — Progress Notes (Signed)
A/ox3, pleased with MAC, report to RN 

## 2020-01-04 NOTE — Op Note (Signed)
[facility] Patient Name: Shaun Velasquez Procedure Date: 01/04/2020 8:40 AM MRN: [id number] Endoscopist: Gerrit Heck , MD Age: 59 Referring MD:  Date of Birth: 11/24/1960 Gender: Male Account #: [id number] Procedure:                Colonoscopy Indications:              Screening for colorectal malignant neoplasm (last                            colonoscopy was 10 years ago) Medicines:                Monitored Anesthesia Care Procedure:                Pre-Anesthesia Assessment:                           - Prior to the procedure, a History and Physical                            was performed, and patient medications and                            allergies were reviewed. The patient's tolerance of                            previous anesthesia was also reviewed. The risks                            and benefits of the procedure and the sedation                            options and risks were discussed with the patient.                            All questions were answered, and informed consent                            was obtained. Prior Anticoagulants: The patient has                            taken no previous anticoagulant or antiplatelet                            agents. ASA Grade Assessment: II - A patient with                            mild systemic disease. After reviewing the risks                            and benefits, the patient was deemed in                            satisfactory condition to undergo the procedure.  After obtaining informed consent, the colonoscope                            was passed under direct vision. Throughout the                            procedure, the patient's blood pressure, pulse, and                            oxygen saturations were monitored continuously. The                            Olympus [id number] was introduced                            through the anus and advanced to  the the terminal                            ileum. The colonoscopy was performed without                            difficulty. The patient tolerated the procedure                            well. The quality of the bowel preparation was                            good. The terminal ileum, ileocecal valve,                            appendiceal orifice, and rectum were photographed. Scope In: 8:52:20 AM Scope Out: 9:09:49 AM Scope Withdrawal Time: 0 hours 14 minutes 48 seconds  Total Procedure Duration: 0 hours 17 minutes 29 seconds  Findings:                 The perianal and digital rectal examinations were                            normal.                           Two sessile polyps were found in the sigmoid colon                            and cecum. The polyps were 4 to 6 mm in size. These                            polyps were removed with a cold snare. Resection                            and retrieval were complete. Estimated blood loss                            was minimal.  Non-bleeding internal hemorrhoids were found during                            retroflexion. The hemorrhoids were small and Grade                            II (internal hemorrhoids that prolapse but reduce                            spontaneously).                           The terminal ileum appeared normal. Complications:            No immediate complications. Estimated Blood Loss:     Estimated blood loss was minimal. Impression:               - Two 4 to 6 mm polyps in the sigmoid colon and in                            the cecum, removed with a cold snare. Resected and                            retrieved.                           - Non-bleeding internal hemorrhoids.                           - The examined portion of the ileum was normal. Recommendation:           - Patient has a contact number available for                            emergencies. The signs and symptoms of  potential                            delayed complications were discussed with the                            patient. Return to normal activities tomorrow.                            Written discharge instructions were provided to the                            patient.                           - Resume previous diet.                           - Continue present medications.                           - Await pathology results.                           -  Repeat colonoscopy for surveillance based on                            pathology results.                           - Return to GI office PRN.                           - Use fiber, for example Citrucel, Fibercon, Konsyl                            or Metamucil.                           - Internal hemorrhoids were noted on this study and                            may be amenable to hemorrhoid band ligation. If you                            are interested in further treatment of these                            hemorrhoids with band ligation, please contact my                            clinic to set up an appointment for evaluation and                            treatment. Gerrit Heck, MD 01/04/2020 9:17:01 AM

## 2020-01-08 ENCOUNTER — Telehealth: Payer: Self-pay | Admitting: *Deleted

## 2020-01-08 NOTE — Telephone Encounter (Signed)
No answer for post procedure call back. Left message for patient to call with questions or concerns. 

## 2020-01-10 ENCOUNTER — Ambulatory Visit (INDEPENDENT_AMBULATORY_CARE_PROVIDER_SITE_OTHER): Payer: BC Managed Care – PPO

## 2020-01-10 DIAGNOSIS — J309 Allergic rhinitis, unspecified: Secondary | ICD-10-CM | POA: Diagnosis not present

## 2020-01-15 DIAGNOSIS — J301 Allergic rhinitis due to pollen: Secondary | ICD-10-CM

## 2020-01-15 NOTE — Progress Notes (Signed)
Vials exp 01-14-21

## 2020-01-18 ENCOUNTER — Encounter: Payer: Self-pay | Admitting: Gastroenterology

## 2020-01-30 ENCOUNTER — Ambulatory Visit: Payer: BC Managed Care – PPO | Admitting: Physician Assistant

## 2020-01-31 ENCOUNTER — Ambulatory Visit: Payer: BC Managed Care – PPO | Admitting: Physician Assistant

## 2020-01-31 ENCOUNTER — Other Ambulatory Visit: Payer: Self-pay

## 2020-01-31 ENCOUNTER — Telehealth (INDEPENDENT_AMBULATORY_CARE_PROVIDER_SITE_OTHER): Payer: BC Managed Care – PPO | Admitting: Physician Assistant

## 2020-01-31 ENCOUNTER — Encounter: Payer: Self-pay | Admitting: Physician Assistant

## 2020-01-31 DIAGNOSIS — E785 Hyperlipidemia, unspecified: Secondary | ICD-10-CM

## 2020-01-31 DIAGNOSIS — E119 Type 2 diabetes mellitus without complications: Secondary | ICD-10-CM

## 2020-01-31 NOTE — Progress Notes (Signed)
Virtual Visit via Video   I connected with patient on 01/31/20 at  2:00 PM EST by a video enabled telemedicine application and verified that I am speaking with the correct person using two identifiers.  Location patient: Home Location provider: [facility], Office Persons participating in the virtual visit: Patient, Provider, [place] (Patina Moore)  I discussed the limitations of evaluation and management by telemedicine and the availability of in person appointments. The patient expressed understanding and agreed to proceed.  Subjective:   HPI:   Patient presents via [place] today to follow-up for Diabetes Mellitus, controlled without known complication as he is overdue. Patient is currently on a regimen of Metformin 1000 mg BID and Jardiance 25 mg QD. Also taking Crestor 20 mg daily. Endorses taking medications as directed and tolerating well. Notes he occasionally checks glucose with fasting averaging up to 130. Is trying to keep active and watch diet. Denies vision changes, urinary changes, numbness/tingling of extremities. Is due for eye exam, noting he was trying to get scheduled before end of the year but was unable. Is trying to schedule ASAP this year. Patient denies chest pain, palpitations, lightheadedness, dizziness, vision changes or frequent headaches.   Lab Results  Component Value Date   HGBA1C 7.9 (H) 11/01/2019   ROS:   See pertinent positives and negatives per HPI.  Patient Active Problem List   Diagnosis Date Noted  . Prostate cancer screening 03/22/2018  . Acute non-recurrent maxillary sinusitis 03/22/2018  . Visit for preventive health examination 05/22/2016  . Bilateral shoulder pain 03/29/2016  . Perennial and seasonal allergic rhinitis 08/27/2015  . Mild persistent asthma without complication 69/48/5462  . Non morbid obesity due to excess calories 05/30/2015  . Hyperlipidemia 05/19/2015  . Hypothyroidism 05/19/2015  . Diabetes mellitus type II,  controlled, with no complications ([place]) 70/35/0093  . S/P shoulder replacement 12/27/2014    Social History   Tobacco Use  . Smoking status: Former Smoker    Years: 15.00    Types: Cigarettes    Quit date: 1996    Years since quitting: 26.0  . Smokeless tobacco: Never Used  Substance Use Topics  . Alcohol use: No    Current Outpatient Medications:  .  betamethasone dipropionate 0.05 % cream, Apply 1 application topically 2 (two) times daily., Disp: , Rfl:  .  empagliflozin (JARDIANCE) 25 MG TABS tablet, Take 1 tablet (25 mg total) by mouth daily., Disp: 90 tablet, Rfl: 1 .  fluticasone (FLONASE) 50 MCG/ACT nasal spray, Place 2 sprays into both nostrils daily. (Patient taking differently: Place 2 sprays into both nostrils daily as needed.), Disp: 16 g, Rfl: 5 .  glucose blood test strip, Please check blood sugar once a day due to fluctuating blood sugars., Disp: , Rfl:  .  levothyroxine (SYNTHROID) 175 MCG tablet, Take 1 tablet (175 mcg total) by mouth daily before breakfast., Disp: 90 tablet, Rfl: 1 .  metFORMIN (GLUCOPHAGE) 500 MG tablet, TAKE 2 TABLETS BY MOUTH TWICE A DAY WITH MEALS. DUE FOR DIABETIC FOLLOW UP, Disp: 120 tablet, Rfl: 3 .  montelukast (SINGULAIR) 10 MG tablet, TAKE 1 TABLET BY MOUTH EVERYDAY AT BEDTIME (Patient taking differently: daily as needed.), Disp: 30 tablet, Rfl: 5 .  Multiple Vitamin (MULTIVITAMIN WITH MINERALS) TABS tablet, Take 1 tablet by mouth daily., Disp: , Rfl:  .  rosuvastatin (CRESTOR) 20 MG tablet, Take 1 tablet (20 mg total) by mouth daily., Disp: 90 tablet, Rfl: 1 .  SKYRIZI PEN 150 MG/ML SOAJ, , Disp: ,  Rfl:  .  Triamcinolone Acetonide 0.025 % LOTN, , Disp: , Rfl:   No Known Allergies  Objective:   There were no vitals taken for this visit.  Patient is well-developed, well-nourished in no acute distress.  Resting comfortably at home.  Head is normocephalic, atraumatic.  No labored breathing.  Speech is clear and coherent with logical  content.  Patient is alert and oriented at baseline.   Assessment and Plan:   1. Controlled type 2 diabetes mellitus without complication, without long-term current use of insulin ([place]) 2. Hyperlipidemia, unspecified hyperlipidemia type Taking medications as directed. Is working on getting eye exam scheduled. Plan for lab visit to update CMP, A1C, lipids and urine microalbumin levels. Will adjust medications accordingly. Dietary and exercise recommendations reviewed.     Leeanne Rio, PA-C 01/31/2020

## 2020-01-31 NOTE — Progress Notes (Signed)
I have discussed the procedure for the virtual visit with the patient who has given consent to proceed with assessment and treatment.   Esco Joslyn S Nadav Swindell, CMA     

## 2020-02-05 ENCOUNTER — Other Ambulatory Visit (INDEPENDENT_AMBULATORY_CARE_PROVIDER_SITE_OTHER): Payer: BC Managed Care – PPO

## 2020-02-05 ENCOUNTER — Other Ambulatory Visit: Payer: Self-pay

## 2020-02-05 DIAGNOSIS — E119 Type 2 diabetes mellitus without complications: Secondary | ICD-10-CM

## 2020-02-05 DIAGNOSIS — E785 Hyperlipidemia, unspecified: Secondary | ICD-10-CM | POA: Diagnosis not present

## 2020-02-05 LAB — COMPREHENSIVE METABOLIC PANEL
ALT: 21 U/L (ref 0–53)
AST: 17 U/L (ref 0–37)
Albumin: 4.6 g/dL (ref 3.5–5.2)
Alkaline Phosphatase: 66 U/L (ref 39–117)
BUN: 15 mg/dL (ref 6–23)
CO2: 29 mEq/L (ref 19–32)
Calcium: 9.6 mg/dL (ref 8.4–10.5)
Chloride: 96 mEq/L (ref 96–112)
Creatinine, Ser: 0.76 mg/dL (ref 0.40–1.50)
GFR: 97.52 mL/min (ref >60.00)
Glucose, Bld: 110 mg/dL — ABNORMAL HIGH (ref 70–99)
Potassium: 4 mEq/L (ref 3.5–5.1)
Sodium: 132 mEq/L — ABNORMAL LOW (ref 135–145)
Total Bilirubin: 0.6 mg/dL (ref 0.2–1.2)
Total Protein: 7.5 g/dL (ref 6.0–8.3)

## 2020-02-05 LAB — LIPID PANEL
Cholesterol: 111 mg/dL (ref 0–200)
HDL: 37.8 mg/dL — ABNORMAL LOW (ref >39.00)
LDL Cholesterol: 56 mg/dL (ref 0–99)
NonHDL: 73.44
Total CHOL/HDL Ratio: 3
Triglycerides: 89 mg/dL (ref 0.0–149.0)
VLDL: 17.8 mg/dL (ref 0.0–40.0)

## 2020-02-05 LAB — MICROALBUMIN / CREATININE URINE RATIO
Creatinine,U: 66 mg/dL
Microalb Creat Ratio: 1.1 mg/g (ref 0.0–30.0)
Microalb, Ur: <0.7 mg/dL (ref 0.0–1.9)

## 2020-02-05 LAB — HEMOGLOBIN A1C: Hgb A1c MFr Bld: 6.9 % — ABNORMAL HIGH (ref 4.6–6.5)

## 2020-02-07 ENCOUNTER — Other Ambulatory Visit: Payer: Self-pay | Admitting: Emergency Medicine

## 2020-02-07 ENCOUNTER — Other Ambulatory Visit (INDEPENDENT_AMBULATORY_CARE_PROVIDER_SITE_OTHER): Payer: BC Managed Care – PPO

## 2020-02-07 ENCOUNTER — Encounter: Payer: Self-pay | Admitting: Physician Assistant

## 2020-02-07 DIAGNOSIS — E039 Hypothyroidism, unspecified: Secondary | ICD-10-CM | POA: Diagnosis not present

## 2020-02-07 LAB — TSH: TSH: 0.48 u[IU]/mL (ref 0.35–4.50)

## 2020-02-07 MED ORDER — LEVOTHYROXINE SODIUM 175 MCG PO TABS: 175.0000 ug | ORAL_TABLET | Freq: Every day | ORAL | 2 refills | Status: DC

## 2020-02-09 ENCOUNTER — Ambulatory Visit (INDEPENDENT_AMBULATORY_CARE_PROVIDER_SITE_OTHER): Payer: BC Managed Care – PPO

## 2020-02-09 DIAGNOSIS — J309 Allergic rhinitis, unspecified: Secondary | ICD-10-CM

## 2020-03-12 ENCOUNTER — Ambulatory Visit (INDEPENDENT_AMBULATORY_CARE_PROVIDER_SITE_OTHER): Payer: BC Managed Care – PPO

## 2020-03-12 DIAGNOSIS — J309 Allergic rhinitis, unspecified: Secondary | ICD-10-CM | POA: Diagnosis not present

## 2020-03-20 ENCOUNTER — Ambulatory Visit (INDEPENDENT_AMBULATORY_CARE_PROVIDER_SITE_OTHER): Payer: BC Managed Care – PPO

## 2020-03-20 DIAGNOSIS — J309 Allergic rhinitis, unspecified: Secondary | ICD-10-CM

## 2020-04-01 ENCOUNTER — Ambulatory Visit (INDEPENDENT_AMBULATORY_CARE_PROVIDER_SITE_OTHER): Payer: BC Managed Care – PPO

## 2020-04-01 DIAGNOSIS — J309 Allergic rhinitis, unspecified: Secondary | ICD-10-CM | POA: Diagnosis not present

## 2020-04-03 ENCOUNTER — Other Ambulatory Visit: Payer: Self-pay | Admitting: Physician Assistant

## 2020-04-03 DIAGNOSIS — E1165 Type 2 diabetes mellitus with hyperglycemia: Secondary | ICD-10-CM

## 2020-04-09 ENCOUNTER — Encounter: Payer: Self-pay | Admitting: Physician Assistant

## 2020-04-09 DIAGNOSIS — E1165 Type 2 diabetes mellitus with hyperglycemia: Secondary | ICD-10-CM

## 2020-04-09 DIAGNOSIS — E039 Hypothyroidism, unspecified: Secondary | ICD-10-CM

## 2020-04-10 MED ORDER — METFORMIN HCL 500 MG PO TABS
ORAL_TABLET | ORAL | 5 refills | Status: DC
Start: 2020-04-10 — End: 2020-11-17

## 2020-04-10 NOTE — Telephone Encounter (Signed)
Pt requesting refill Metformin.

## 2020-04-12 ENCOUNTER — Ambulatory Visit (INDEPENDENT_AMBULATORY_CARE_PROVIDER_SITE_OTHER): Payer: BC Managed Care – PPO

## 2020-04-12 DIAGNOSIS — J309 Allergic rhinitis, unspecified: Secondary | ICD-10-CM | POA: Diagnosis not present

## 2020-04-17 NOTE — Telephone Encounter (Signed)
Patient states he also needs refill on levothyroxine.

## 2020-04-18 MED ORDER — LEVOTHYROXINE SODIUM 175 MCG PO TABS: 175.0000 ug | ORAL_TABLET | Freq: Every day | ORAL | 2 refills | Status: DC

## 2020-04-23 ENCOUNTER — Ambulatory Visit (INDEPENDENT_AMBULATORY_CARE_PROVIDER_SITE_OTHER): Payer: BC Managed Care – PPO | Admitting: *Deleted

## 2020-04-23 DIAGNOSIS — J309 Allergic rhinitis, unspecified: Secondary | ICD-10-CM

## 2020-05-15 ENCOUNTER — Other Ambulatory Visit: Payer: Self-pay

## 2020-05-15 DIAGNOSIS — E785 Hyperlipidemia, unspecified: Secondary | ICD-10-CM

## 2020-05-15 MED ORDER — ROSUVASTATIN CALCIUM 20 MG PO TABS: 20.0000 mg | ORAL_TABLET | Freq: Every day | ORAL | 0 refills | Status: DC

## 2020-05-16 ENCOUNTER — Other Ambulatory Visit: Payer: Self-pay

## 2020-05-16 DIAGNOSIS — E119 Type 2 diabetes mellitus without complications: Secondary | ICD-10-CM

## 2020-05-16 MED ORDER — EMPAGLIFLOZIN 25 MG PO TABS: 25.0000 mg | ORAL_TABLET | Freq: Every day | ORAL | 0 refills | Status: DC

## 2020-05-21 ENCOUNTER — Ambulatory Visit (INDEPENDENT_AMBULATORY_CARE_PROVIDER_SITE_OTHER): Payer: BC Managed Care – PPO | Admitting: *Deleted

## 2020-05-21 DIAGNOSIS — J309 Allergic rhinitis, unspecified: Secondary | ICD-10-CM | POA: Diagnosis not present

## 2020-06-24 ENCOUNTER — Other Ambulatory Visit: Payer: Self-pay

## 2020-06-24 ENCOUNTER — Encounter: Payer: Self-pay | Admitting: Family Medicine

## 2020-06-24 ENCOUNTER — Ambulatory Visit (INDEPENDENT_AMBULATORY_CARE_PROVIDER_SITE_OTHER): Payer: BC Managed Care – PPO | Admitting: Family Medicine

## 2020-06-24 VITALS — BP 124/70 | HR 75 | Temp 98.2°F | Ht 69.0 in | Wt 197.2 lb

## 2020-06-24 DIAGNOSIS — L219 Seborrheic dermatitis, unspecified: Secondary | ICD-10-CM | POA: Insufficient documentation

## 2020-06-24 DIAGNOSIS — E119 Type 2 diabetes mellitus without complications: Secondary | ICD-10-CM

## 2020-06-24 DIAGNOSIS — E039 Hypothyroidism, unspecified: Secondary | ICD-10-CM

## 2020-06-24 DIAGNOSIS — J3089 Other allergic rhinitis: Secondary | ICD-10-CM

## 2020-06-24 DIAGNOSIS — E785 Hyperlipidemia, unspecified: Secondary | ICD-10-CM

## 2020-06-24 DIAGNOSIS — L409 Psoriasis, unspecified: Secondary | ICD-10-CM

## 2020-06-24 DIAGNOSIS — E349 Endocrine disorder, unspecified: Secondary | ICD-10-CM

## 2020-06-24 NOTE — Progress Notes (Signed)
[facility] [address] [place] [address] Dept: [phone] Dept Fax: [phone]  Transfer of Care Office Visit  Subjective:    Patient ID: Shaun Velasquez, male    DOB: 12-05-1960, 61 y.o..   MRN: [id number]  Chief Complaint  Patient presents with   Establish Care    [facility]- DM/Thyroid/chol.  C/o having a bug bite on his RT arm x 2 days.     History of Present Illness:  Patient is in today to establish care. Shaun Velasquez is originally from [place]. His father was in the [employer], so he moved around to many parts of the [place] in his childhood. He lived the longest in [place]. He is divorced and remarried. He and his wife relocated to [place] as a compromise to be a little closer to family. 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 He denies tobacco, alcohol, or drug use.  Shaun Velasquez has a history of Type 2 diabetes. He is managed on metformin and Jardiance. He does not test his blood sugars at home. He notes he did have his annual eye exam in April at [facility].  Shaun Velasquez has a history of  hyperlipidemia. He is on rosuvastatin and notes this controls his lipids quite well.  Shaun Velasquez has a history fo hypothyroidism, managed on Synthroid.  Shaun Velasquez has a history of allergic rhinitis. He has been on immunotherapy for about 5 years. He takes Singulair as needed when he has flares in the spring. He also uses Flonase nasal spray. Hhas used an albuterol inhaler at times for mild allergic asthma.  Shaun Velasquez has a history of psoriasis. He is currently managed with Orson Ape and has seen excellent response to this. He also has some seborrhea, managed with a topical steroid lotion.  Shaun Velasquez notes that he has been seen at an integrative medicine clinic. He was tested for food allergies and noted ot have issues with wheat, gluten, and milk. He made dietary changes to excluded these from his diet and notes he  feels much better. Additionally, he notes he was found to have a low testosterone level. He is now on testosterone replacement, 200 mg injection weekly.  Past Medical History: Patient Active Problem List   Diagnosis Date Noted   Psoriasis 06/24/2020   Seborrhea 06/24/2020   Testosterone deficiency 06/24/2020   Bilateral shoulder pain 03/29/2016   Perennial and seasonal allergic rhinitis 08/27/2015   Mild persistent asthma without complication 60/73/7106   Overweight (BMI 25.0-29.9) 05/30/2015   Hyperlipidemia 05/19/2015   Hypothyroidism 05/19/2015   Diabetes mellitus type II, controlled, with no complications ([place]) 26/94/8546   S/P shoulder replacement 12/27/2014   Past Surgical History:  Procedure Laterality Date   CHEST TUBE INSERTION     pt had collapsed lung   COLONOSCOPY  2011   on [address], not sure name of the practice(normal)   COLONOSCOPY W/ POLYPECTOMY     ELBOW SURGERY Right 2009   INCISION AND DRAINAGE PERIRECTAL ABSCESS     REVERSE SHOULDER ARTHROPLASTY Right 12/27/2014   Procedure: RIGHT REVERSE SHOULDER ARTHROPLASTY;  Surgeon: Justice Britain, MD;  Location: [facility];  Service: Orthopedics;  Laterality: Right;   ROTATOR CUFF REPAIR Right 2011   SHOULDER ARTHROSCOPY W/ LABRAL REPAIR Right    SHOULDER ARTHROSCOPY WITH ROTATOR CUFF REPAIR Left 2003   SHOULDER ARTHROSCOPY WITH SUBACROMIAL DECOMPRESSION Left    Family History  Problem Relation Age of Onset  Allergic rhinitis Mother    Lung cancer Mother    Asthma Sister    Cancer Sister        Skin   Colon cancer Neg Hx    Colon polyps Neg Hx    Esophageal cancer Neg Hx    Stomach cancer Neg Hx    Rectal cancer Neg Hx    Outpatient Medications Prior to Visit  Medication Sig Dispense Refill   empagliflozin (JARDIANCE) 25 MG TABS tablet Take 1 tablet (25 mg total) by mouth daily. 90 tablet 0   fluticasone (FLONASE) 50 MCG/ACT nasal spray Place 2 sprays into both nostrils daily. (Patient taking differently:  Place 2 sprays into both nostrils daily as needed.) 16 g 5   glucose blood test strip Please check blood sugar once a day due to fluctuating blood sugars.     levothyroxine (SYNTHROID) 175 MCG tablet Take 1 tablet (175 mcg total) by mouth daily before breakfast. 90 tablet 2   metFORMIN (GLUCOPHAGE) 500 MG tablet TAKE 2 TABLETS BY MOUTH TWICE A DAY WITH MEALS. 120 tablet 5   Multiple Vitamin (MULTIVITAMIN WITH MINERALS) TABS tablet Take 1 tablet by mouth daily.     naproxen (NAPROSYN) 500 MG tablet      rosuvastatin (CRESTOR) 20 MG tablet Take 1 tablet (20 mg total) by mouth daily. 90 tablet 0   SKYRIZI PEN 150 MG/ML SOAJ      testosterone cypionate (DEPOTESTOSTERONE CYPIONATE) 200 MG/ML injection PLEASE SEE ATTACHED FOR DETAILED DIRECTIONS     Triamcinolone Acetonide 0.025 % LOTN      montelukast (SINGULAIR) 10 MG tablet TAKE 1 TABLET BY MOUTH EVERYDAY AT BEDTIME (Patient taking differently: daily as needed.) 30 tablet 5   betamethasone dipropionate 0.05 % cream Apply 1 application topically 2 (two) times daily. (Patient not taking: Reported on 06/24/2020)     No facility-administered medications prior to visit.   No Known Allergies    Objective:   Today's Vitals   06/24/20 1434  BP: 124/70  Pulse: 75  Temp: 98.2 F (36.8 C)  TempSrc: Temporal  SpO2: 95%  Weight: 197 lb 3.2 oz (89.4 kg)  Height: 5\' 9"  (1.753 m)   Body mass index is 29.12 kg/m.   General: Well developed, well nourished. No acute distress. Feet: Skin intact. There is a 3-5 mm flat warty lesion on the ball of the left foot. Pulses present (DP and   PT). 5.07 monofilament testing normal. Psych: Alert and oriented. Normal mood and affect.  Health Maintenance Due  Topic Date Due   Zoster Vaccines- Shingrix (1 of 2) Never done   COVID-19 Vaccine (3 - Booster for Pf[employer] series) 11/01/2019    Lab results: Lab Results  Component Value Date   HGBA1C 6.9 (H) 02/05/2020   Lab Results  Component Value Date   CHOL  111 02/05/2020   HDL 37.80 (L) 02/05/2020   LDLCALC 56 02/05/2020   LDLDIRECT 218.0 07/02/2017   TRIG 89.0 02/05/2020   CHOLHDL 3 02/05/2020   Lab Results  Component Value Date   TSH 0.48 02/07/2020     Assessment & Plan:   1. Controlled type 2 diabetes mellitus without complication, without long-term current use of insulin ([place]) Diabetes has been at goal. Will check quarterly labs. Continue metformin and Jardiance  - Glucose, random; Future - Hemoglobin A1c; Future  2. Hyperlipidemia, unspecified hyperlipidemia type Due for reassessment. Continue Crestor.  - Lipid panel; Future  3. Psoriasis Stable on Skyrizi.  4. Perennial and seasonal  allergic rhinitis Stable with PRN use of Flonase and Singulair. He will continue immunotherapy with his allergist.  5. Hypothyroidism, unspecified type On Synthroid. I will check his TSH to assure adequacy of therapy.  - TSH; Future  6. Testosterone deficiency Mr. Lindy is on testosterone replacement by history. I will check a morning testosterone level and scening CBC.   - CBC; Future - Testosterone; Future  Haydee Salter, MD

## 2020-07-01 ENCOUNTER — Other Ambulatory Visit: Payer: Self-pay

## 2020-07-01 ENCOUNTER — Ambulatory Visit (INDEPENDENT_AMBULATORY_CARE_PROVIDER_SITE_OTHER): Payer: BC Managed Care – PPO

## 2020-07-01 ENCOUNTER — Other Ambulatory Visit (INDEPENDENT_AMBULATORY_CARE_PROVIDER_SITE_OTHER): Payer: BC Managed Care – PPO

## 2020-07-01 DIAGNOSIS — E119 Type 2 diabetes mellitus without complications: Secondary | ICD-10-CM | POA: Diagnosis not present

## 2020-07-01 DIAGNOSIS — J309 Allergic rhinitis, unspecified: Secondary | ICD-10-CM

## 2020-07-01 DIAGNOSIS — E039 Hypothyroidism, unspecified: Secondary | ICD-10-CM | POA: Diagnosis not present

## 2020-07-01 DIAGNOSIS — E785 Hyperlipidemia, unspecified: Secondary | ICD-10-CM | POA: Diagnosis not present

## 2020-07-01 DIAGNOSIS — E349 Endocrine disorder, unspecified: Secondary | ICD-10-CM

## 2020-07-01 LAB — CBC
HCT: 48 % (ref 39.0–52.0)
Hemoglobin: 16 g/dL (ref 13.0–17.0)
MCHC: 33.3 g/dL (ref 30.0–36.0)
MCV: 82.2 fl (ref 78.0–100.0)
Platelets: 217 10*3/uL (ref 150.0–400.0)
RBC: 5.83 Mil/uL — ABNORMAL HIGH (ref 4.22–5.81)
RDW: 15.2 % (ref 11.5–15.5)
WBC: 5.6 10*3/uL (ref 4.0–10.5)

## 2020-07-01 LAB — LIPID PANEL
Cholesterol: 161 mg/dL (ref 0–200)
HDL: 54.6 mg/dL (ref >39.00)
LDL Cholesterol: 88 mg/dL (ref 0–99)
NonHDL: 106.46
Total CHOL/HDL Ratio: 3
Triglycerides: 94 mg/dL (ref 0.0–149.0)
VLDL: 18.8 mg/dL (ref 0.0–40.0)

## 2020-07-01 LAB — TSH: TSH: 2.35 u[IU]/mL (ref 0.35–4.50)

## 2020-07-01 LAB — GLUCOSE, RANDOM: Glucose, Bld: 128 mg/dL — ABNORMAL HIGH (ref 70–99)

## 2020-07-01 LAB — TESTOSTERONE: Testosterone: 117.85 ng/dL — ABNORMAL LOW (ref 300.00–890.00)

## 2020-07-01 LAB — HEMOGLOBIN A1C: Hgb A1c MFr Bld: 7.1 % — ABNORMAL HIGH (ref 4.6–6.5)

## 2020-07-01 NOTE — Progress Notes (Signed)
Per orders of Dr. Gena Fray pt is here for labs pt tolerated draw well.

## 2020-08-02 ENCOUNTER — Ambulatory Visit (INDEPENDENT_AMBULATORY_CARE_PROVIDER_SITE_OTHER): Payer: BC Managed Care – PPO

## 2020-08-02 DIAGNOSIS — J309 Allergic rhinitis, unspecified: Secondary | ICD-10-CM | POA: Diagnosis not present

## 2020-08-18 ENCOUNTER — Other Ambulatory Visit: Payer: Self-pay | Admitting: Family Medicine

## 2020-08-18 DIAGNOSIS — E785 Hyperlipidemia, unspecified: Secondary | ICD-10-CM

## 2020-08-18 DIAGNOSIS — E119 Type 2 diabetes mellitus without complications: Secondary | ICD-10-CM

## 2020-08-30 ENCOUNTER — Ambulatory Visit (INDEPENDENT_AMBULATORY_CARE_PROVIDER_SITE_OTHER): Payer: BC Managed Care – PPO

## 2020-08-30 DIAGNOSIS — J309 Allergic rhinitis, unspecified: Secondary | ICD-10-CM | POA: Diagnosis not present

## 2020-09-02 ENCOUNTER — Encounter: Payer: Self-pay | Admitting: Family Medicine

## 2020-09-02 DIAGNOSIS — E119 Type 2 diabetes mellitus without complications: Secondary | ICD-10-CM

## 2020-09-02 DIAGNOSIS — E785 Hyperlipidemia, unspecified: Secondary | ICD-10-CM

## 2020-09-03 MED ORDER — ROSUVASTATIN CALCIUM 20 MG PO TABS: 20.0000 mg | ORAL_TABLET | Freq: Every day | ORAL | 0 refills | Status: DC

## 2020-09-03 MED ORDER — EMPAGLIFLOZIN 25 MG PO TABS: 25.0000 mg | ORAL_TABLET | Freq: Every day | ORAL | 0 refills | Status: DC

## 2020-09-27 ENCOUNTER — Encounter: Payer: Self-pay | Admitting: Family Medicine

## 2020-09-27 ENCOUNTER — Other Ambulatory Visit: Payer: Self-pay

## 2020-09-27 ENCOUNTER — Ambulatory Visit (INDEPENDENT_AMBULATORY_CARE_PROVIDER_SITE_OTHER): Payer: BC Managed Care – PPO | Admitting: Family Medicine

## 2020-09-27 VITALS — BP 110/74 | HR 71 | Temp 97.5°F | Ht 69.0 in | Wt 194.6 lb

## 2020-09-27 DIAGNOSIS — Z23 Encounter for immunization: Secondary | ICD-10-CM | POA: Diagnosis not present

## 2020-09-27 DIAGNOSIS — E785 Hyperlipidemia, unspecified: Secondary | ICD-10-CM

## 2020-09-27 DIAGNOSIS — E039 Hypothyroidism, unspecified: Secondary | ICD-10-CM

## 2020-09-27 DIAGNOSIS — E663 Overweight: Secondary | ICD-10-CM | POA: Diagnosis not present

## 2020-09-27 DIAGNOSIS — E119 Type 2 diabetes mellitus without complications: Secondary | ICD-10-CM

## 2020-09-27 DIAGNOSIS — E349 Endocrine disorder, unspecified: Secondary | ICD-10-CM

## 2020-09-27 NOTE — Addendum Note (Signed)
Addended by: Marchia Bond on: 09/27/2020 04:29 PM   Modules accepted: Orders

## 2020-09-27 NOTE — Progress Notes (Signed)
[facility] [address] [place] [address] Dept: [phone] Dept Fax: [phone]  Chronic Care Office Visit  Subjective:    Patient ID: Shaun Velasquez, male    DOB: 03-Nov-1960, 60 y.o..   MRN: [id number]  Chief Complaint  Patient presents with   Follow-up    3 mo f/u DM    History of Present Illness:  Patient is in today for reassessment of chronic medical issues.  Shaun Velasquez has a history of Type 2 diabetes. He is managed on metformin and Jardiance. He does not test his blood sugars at home. He is very active with farm work currently.   Shaun Velasquez has a history of  hyperlipidemia. He is on rosuvastatin and notes this controls his lipids quite well.   Shaun Velasquez has a history of hypothyroidism, managed on Synthroid.   Shaun Velasquez has a history of psoriasis. He is currently managed with Orson Ape and has seen excellent response to this.  Shaun Velasquez has had a prior right shoulder joint replacement. He notes that his left shoulder continues to give him some trouble. He was seen at [facility], but was told there was nothing to offer for this. He plans to go to [facility] and get a 2nd opinion.  Past Medical History: Patient Active Problem List   Diagnosis Date Noted   Psoriasis 06/24/2020   Seborrhea 06/24/2020   Testosterone deficiency 06/24/2020   Bilateral shoulder pain 03/29/2016   Perennial and seasonal allergic rhinitis 08/27/2015   Mild persistent asthma without complication 99991111   Overweight (BMI 25.0-29.9) 05/30/2015   Hyperlipidemia 05/19/2015   Hypothyroidism 05/19/2015   Diabetes mellitus type II, controlled, with no complications ([place]) [id number]   S/P shoulder replacement 12/27/2014   Past Surgical History:  Procedure Laterality Date   CHEST TUBE INSERTION     pt had collapsed lung   COLONOSCOPY  2011   on [address], not sure name of the practice(normal)   COLONOSCOPY W/ POLYPECTOMY      ELBOW SURGERY Right 2009   INCISION AND DRAINAGE PERIRECTAL ABSCESS     REVERSE SHOULDER ARTHROPLASTY Right 12/27/2014   Procedure: RIGHT REVERSE SHOULDER ARTHROPLASTY;  Surgeon: Justice Britain, MD;  Location: [facility];  Service: Orthopedics;  Laterality: Right;   ROTATOR CUFF REPAIR Right 2011   SHOULDER ARTHROSCOPY W/ LABRAL REPAIR Right    SHOULDER ARTHROSCOPY WITH ROTATOR CUFF REPAIR Left 2003   SHOULDER ARTHROSCOPY WITH SUBACROMIAL DECOMPRESSION Left    Family History  Problem Relation Age of Onset   Allergic rhinitis Mother    Lung cancer Mother    Asthma Sister    Cancer Sister        Skin   Colon cancer Neg Hx    Colon polyps Neg Hx    Esophageal cancer Neg Hx    Stomach cancer Neg Hx    Rectal cancer Neg Hx    Outpatient Medications Prior to Visit  Medication Sig Dispense Refill   empagliflozin (JARDIANCE) 25 MG TABS tablet Take 1 tablet (25 mg total) by mouth daily. 90 tablet 0   fluticasone (FLONASE) 50 MCG/ACT nasal spray Place 2 sprays into both nostrils daily. (Patient taking differently: Place 2 sprays into both nostrils daily as needed.) 16 g 5   glucose blood test strip Please check blood sugar once a day due to fluctuating blood sugars.     levothyroxine (SYNTHROID) 175 MCG tablet Take 1 tablet (175 mcg total) by mouth daily before  breakfast. 90 tablet 2   metFORMIN (GLUCOPHAGE) 500 MG tablet TAKE 2 TABLETS BY MOUTH TWICE A DAY WITH MEALS. 120 tablet 5   montelukast (SINGULAIR) 10 MG tablet TAKE 1 TABLET BY MOUTH EVERYDAY AT BEDTIME (Patient taking differently: daily as needed.) 30 tablet 5   Multiple Vitamin (MULTIVITAMIN WITH MINERALS) TABS tablet Take 1 tablet by mouth daily.     naproxen (NAPROSYN) 500 MG tablet      rosuvastatin (CRESTOR) 20 MG tablet Take 1 tablet (20 mg total) by mouth daily. 90 tablet 0   SKYRIZI PEN 150 MG/ML SOAJ      testosterone cypionate (DEPOTESTOSTERONE CYPIONATE) 200 MG/ML injection PLEASE SEE ATTACHED FOR DETAILED DIRECTIONS      Triamcinolone Acetonide 0.025 % LOTN      No facility-administered medications prior to visit.   No Known Allergies    Objective:   Today's Vitals   09/27/20 1553  BP: 110/74  Pulse: 71  Temp: (!) 97.5 F (36.4 C)  TempSrc: Temporal  SpO2: 97%  Weight: 194 lb 9.6 oz (88.3 kg)  Height: '5\' 9"'$  (1.753 m)   Body mass index is 28.74 kg/m.   General: Well developed, well nourished. No acute distress. Psych: Alert and oriented. Normal mood and affect.  Health Maintenance Due  Topic Date Due   Zoster Vaccines- Shingrix (1 of 2) Never done   COVID-19 Vaccine (3 - Booster for [employer] series) 11/01/2019   INFLUENZA VACCINE  08/12/2020   Lab Results Lab Results  Component Value Date   HGBA1C 7.1 (H) 07/01/2020   Lab Results  Component Value Date   CHOL 161 07/01/2020   HDL 54.60 07/01/2020   LDLCALC 88 07/01/2020   LDLDIRECT 218.0 07/02/2017   TRIG 94.0 07/01/2020   CHOLHDL 3 07/01/2020   Lab Results  Component Value Date   TSH 2.35 07/01/2020   Component Ref Range & Units 2 mo ago   Testosterone 300.00 - 890.00 ng/dL 117.85 Low        Assessment & Plan:   1. Controlled type 2 diabetes mellitus without complication, without long-term current use of insulin [facility]) Shaun Velasquez diabetes has been in adequate control on metformin and Jardiance. He is due for a repeat A1c today.  - Glucose, random - Hemoglobin A1c  2. Hypothyroidism, unspecified type Last TSH was at goal. Continue Synthroid. Follow up in 3 months for repeat TSH.  3. Hyperlipidemia, unspecified hyperlipidemia type AT goal on Crestor. Plan repeat lipids in 3 months.  4. Overweight (BMI 25.0-29.9) Weight is down 16 lbs in last year.  5. Testosterone deficiency Last TSH remained low despite weekly testosterone injections. I will refer him to urology for assessment of treatment and potential need to alter therapy.  - Ambulatory referral to Urology  Shaun Salter, MD

## 2020-09-28 LAB — GLUCOSE, RANDOM: Glucose: 168 mg/dL — ABNORMAL HIGH (ref 65–99)

## 2020-09-28 LAB — HEMOGLOBIN A1C
Est. average glucose Bld gHb Est-mCnc: 206 mg/dL
Hgb A1c MFr Bld: 8.8 % — ABNORMAL HIGH (ref 4.8–5.6)

## 2020-09-30 ENCOUNTER — Ambulatory Visit: Payer: BC Managed Care – PPO | Admitting: Family Medicine

## 2020-10-01 ENCOUNTER — Ambulatory Visit (INDEPENDENT_AMBULATORY_CARE_PROVIDER_SITE_OTHER): Payer: BC Managed Care – PPO | Admitting: *Deleted

## 2020-10-01 DIAGNOSIS — J309 Allergic rhinitis, unspecified: Secondary | ICD-10-CM | POA: Diagnosis not present

## 2020-10-09 ENCOUNTER — Other Ambulatory Visit: Payer: Self-pay | Admitting: Orthopaedic Surgery

## 2020-10-09 DIAGNOSIS — M25512 Pain in left shoulder: Secondary | ICD-10-CM

## 2020-10-09 NOTE — Progress Notes (Signed)
VIALS MADE. EXP 10-09-21 

## 2020-10-10 DIAGNOSIS — J301 Allergic rhinitis due to pollen: Secondary | ICD-10-CM | POA: Diagnosis not present

## 2020-10-18 ENCOUNTER — Ambulatory Visit: Payer: BC Managed Care – PPO | Admitting: Family Medicine

## 2020-10-22 ENCOUNTER — Other Ambulatory Visit: Payer: Self-pay

## 2020-10-23 ENCOUNTER — Other Ambulatory Visit: Payer: BC Managed Care – PPO

## 2020-10-23 ENCOUNTER — Other Ambulatory Visit: Payer: Self-pay

## 2020-10-23 ENCOUNTER — Ambulatory Visit (INDEPENDENT_AMBULATORY_CARE_PROVIDER_SITE_OTHER): Payer: BC Managed Care – PPO | Admitting: Family Medicine

## 2020-10-23 ENCOUNTER — Encounter: Payer: Self-pay | Admitting: Family Medicine

## 2020-10-23 VITALS — BP 116/68 | HR 82 | Temp 97.6°F | Ht 69.0 in | Wt 196.4 lb

## 2020-10-23 DIAGNOSIS — E119 Type 2 diabetes mellitus without complications: Secondary | ICD-10-CM

## 2020-10-23 DIAGNOSIS — E039 Hypothyroidism, unspecified: Secondary | ICD-10-CM

## 2020-10-23 DIAGNOSIS — M19012 Primary osteoarthritis, left shoulder: Secondary | ICD-10-CM | POA: Diagnosis not present

## 2020-10-23 DIAGNOSIS — Z01818 Encounter for other preprocedural examination: Secondary | ICD-10-CM

## 2020-10-23 DIAGNOSIS — L409 Psoriasis, unspecified: Secondary | ICD-10-CM

## 2020-10-23 NOTE — Progress Notes (Signed)
[facility] [address] [place] [address] Dept: [phone] Dept Fax: [phone]  Office Visit  Subjective:    Patient ID: Shaun Velasquez, male    DOB: Feb 12, 1960, 60 y.o..   MRN: [id number]  Chief Complaint  Patient presents with   Pre-op Exam    Pre-Op clearance LT shoulder surgery.      History of Present Illness:  Patient is in today for presurgical clearance. Shaun Velasquez has arthritis of the left shoulder joint. He is seeing Dr. Griffin Basil related to a left shoulder arthritis. He plans to perform a left reverse total shoulder replacement.  Shaun Velasquez 's recent health has been good. He has a history of Type 2 diabetes (managed on metformin and Jardiance), hyperlipidemia (managed with rosuvastatin), hypothyroidism (managed on Synthroid), and psoriasis (managed with Skyrizi). As well, he receives testosterone injections due to testosterone deficiency. Shaun Velasquez had been seen about a month ago. His HbA1c had increased, as he had stopped taking his metformin. he had felt that since he had significant weight loss that he did not need the medication any longer. Having found the increase in his A1c, he has now resumed his medication.  Shaun Velasquez denies any recent respiratory complaints. He has no chest pain or dyspnea. He is not a smoker. There is no family history fo problems with anesthesia.  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   Past Surgical History:  Procedure Laterality Date   CHEST TUBE INSERTION     pt had  collapsed lung   COLONOSCOPY  2011   on [address], not sure name of the practice(normal)   COLONOSCOPY W/ POLYPECTOMY     ELBOW SURGERY Right 2009   INCISION AND DRAINAGE PERIRECTAL ABSCESS     REVERSE SHOULDER ARTHROPLASTY Right 12/27/2014   Procedure: RIGHT REVERSE SHOULDER ARTHROPLASTY;  Surgeon: Justice Britain, MD;  Location: [facility];  Service: Orthopedics;  Laterality: Right;   ROTATOR CUFF REPAIR Right 2011   SHOULDER ARTHROSCOPY W/ LABRAL REPAIR Right    SHOULDER ARTHROSCOPY WITH ROTATOR CUFF REPAIR Left 2003   SHOULDER ARTHROSCOPY WITH SUBACROMIAL DECOMPRESSION Left    Family History  Problem Relation Age of Onset   Allergic rhinitis Mother    Lung cancer Mother    Asthma Sister    Cancer Sister        Skin   Colon cancer Neg Hx    Colon polyps Neg Hx    Esophageal cancer Neg Hx    Stomach cancer Neg Hx    Rectal cancer Neg Hx    Outpatient Medications Prior to Visit  Medication Sig Dispense Refill   empagliflozin (JARDIANCE) 25 MG TABS tablet Take 1 tablet (25 mg total) by mouth daily. 90 tablet 0   fluticasone (FLONASE) 50 MCG/ACT nasal spray Place 2 sprays into both nostrils daily. (Patient taking differently: Place 2 sprays into both nostrils daily as needed.) 16 g 5   glucose blood test strip Please check blood sugar once a day due to fluctuating blood sugars.     levothyroxine (SYNTHROID) 175 MCG tablet Take 1 tablet (175  mcg total) by mouth daily before breakfast. 90 tablet 2   metFORMIN (GLUCOPHAGE) 500 MG tablet TAKE 2 TABLETS BY MOUTH TWICE A DAY WITH MEALS. 120 tablet 5   montelukast (SINGULAIR) 10 MG tablet TAKE 1 TABLET BY MOUTH EVERYDAY AT BEDTIME (Patient taking differently: daily as needed.) 30 tablet 5   Multiple Vitamin (MULTIVITAMIN WITH MINERALS) TABS tablet Take 1 tablet by mouth daily.     naproxen (NAPROSYN) 500 MG tablet      rosuvastatin (CRESTOR) 20 MG tablet Take 1 tablet (20 mg total) by mouth daily. 90 tablet 0   SKYRIZI PEN 150 MG/ML SOAJ       testosterone cypionate (DEPOTESTOSTERONE CYPIONATE) 200 MG/ML injection PLEASE SEE ATTACHED FOR DETAILED DIRECTIONS     Triamcinolone Acetonide 0.025 % LOTN      No facility-administered medications prior to visit.   No Known Allergies    Objective:   TodayVelasquez Vitals   10/23/20 1459  BP: 116/68  Pulse: 82  Temp: 97.6 F (36.4 C)  TempSrc: Temporal  SpO2: 97%  Weight: 196 lb 6.4 oz (89.1 kg)  Height: 5\' 9"  (1.753 m)   Body mass index is 29 kg/m.   General: Well developed, well nourished. No acute distress. HEENT: Normocephalic, non-traumatic. PERRL, EOMI. Conjunctiva clear. External ears normal.   EAC with mild scaliness. TMs normal bilaterally. Nose clear without congestion or rhinorrhea.   Mucous membranes moist. Oropharynx clear. Good dentition. Neck: Supple. No lymphadenopathy. No thyromegaly. Lungs: Clear to auscultation bilaterally. No wheezing, rales or rhonchi. CV: RRR without murmurs or rubs. Pulses 2+ bilaterally. Abdomen: Soft, non-tender. Bowel sounds positive, normal pitch and frequency. No   hepatosplenomegaly. No rebound or guarding. Back: Straight. No CVA tenderness bilaterally. Extremities: No joint swelling. Moderate left shoulder tenderness. No edema noted. Skin: Warm and dry. No rashes. Neuro: No focal neurological deficits. Psych: Alert and oriented. Normal mood and affect.  Health Maintenance Due  Topic Date Due   Zoster Vaccines- Shingrix (1 of 2) Never done   COVID-19 Vaccine (3 - Booster for Pfizer series) 11/01/2019   EKG: Normal sinus rhythm (rate=73)  Lab Results Component Ref Range & Units 3 wk ago  (09/27/20) 3 mo ago  (07/01/20) 8 mo ago  (02/05/20) 11 mo ago  (11/01/19) 1 yr ago  (12/28/18) 2 yr ago  (07/06/18) 2 yr ago  (03/22/18)  Hgb A1c MFr Bld 4.8 - 5.6 % 8.8 High   7.1 High  R, CM  6.9 High  R, CM  7.9 High  R, CM  7.4 High  R, CM  6.9 High  R, CM  8.6 High       Assessment & Plan:   1. Preoperative general physical  examination Overall good health. I will check screenign labs to assure no contraindicaitons to surgery. - Comprehensive metabolic panel - CBC - EKG 12-Lead  2. Arthritis of left shoulder region Followed by Dr. Griffin Basil  3. Controlled type 2 diabetes mellitus without complication, without long-term current use of insulin ([place]) Patient back on metformin. [facility] Pre-operative Risk Assessment form notes a requirement for the HbA1c to be < 7.5 for surgery. I did discuss this with Shaun Velasquez. I will repeat his A1c, even though it has only been a month since his last check. If he has a trend downwards, I will share this with the orthopedist and advocate for consideration to move ahead with surgery. This seems reasonable, as his two prior A1c s did meet this goal before he  stopped his metformin.  - Hemoglobin A1c  4. Hypothyroidism, unspecified type I will recheck his TSH.  - TSH  5. Psoriasis Excellent response to [employer].  Haydee Salter, MD

## 2020-10-24 ENCOUNTER — Encounter: Payer: Self-pay | Admitting: Family Medicine

## 2020-10-24 LAB — CBC
HCT: 44.1 % (ref 39.0–52.0)
Hemoglobin: 14.8 g/dL (ref 13.0–17.0)
MCHC: 33.6 g/dL (ref 30.0–36.0)
MCV: 85.9 fl (ref 78.0–100.0)
Platelets: 219 10*3/uL (ref 150.0–400.0)
RBC: 5.13 Mil/uL (ref 4.22–5.81)
RDW: 14.2 % (ref 11.5–15.5)
WBC: 6.7 10*3/uL (ref 4.0–10.5)

## 2020-10-24 LAB — COMPREHENSIVE METABOLIC PANEL
ALT: 19 U/L (ref 0–53)
AST: 18 U/L (ref 0–37)
Albumin: 4.6 g/dL (ref 3.5–5.2)
Alkaline Phosphatase: 75 U/L (ref 39–117)
BUN: 18 mg/dL (ref 6–23)
CO2: 29 mEq/L (ref 19–32)
Calcium: 10 mg/dL (ref 8.4–10.5)
Chloride: 97 mEq/L (ref 96–112)
Creatinine, Ser: 0.84 mg/dL (ref 0.40–1.50)
GFR: 94.14 mL/min (ref >60.00)
Glucose, Bld: 88 mg/dL (ref 70–99)
Potassium: 4 mEq/L (ref 3.5–5.1)
Sodium: 135 mEq/L (ref 135–145)
Total Bilirubin: 0.6 mg/dL (ref 0.2–1.2)
Total Protein: 7.2 g/dL (ref 6.0–8.3)

## 2020-10-24 LAB — TSH: TSH: 0.37 u[IU]/mL (ref 0.35–5.50)

## 2020-10-24 LAB — HEMOGLOBIN A1C: Hgb A1c MFr Bld: 7.8 % — ABNORMAL HIGH (ref 4.6–6.5)

## 2020-10-30 ENCOUNTER — Ambulatory Visit (INDEPENDENT_AMBULATORY_CARE_PROVIDER_SITE_OTHER): Payer: BC Managed Care – PPO | Admitting: *Deleted

## 2020-10-30 DIAGNOSIS — J309 Allergic rhinitis, unspecified: Secondary | ICD-10-CM

## 2020-11-05 ENCOUNTER — Ambulatory Visit
Admission: RE | Admit: 2020-11-05 | Discharge: 2020-11-05 | Disposition: A | Discharge status: Home / Self Care | Payer: BC Managed Care – PPO | Source: Ambulatory Visit | Attending: Orthopaedic Surgery | Admitting: Orthopaedic Surgery

## 2020-11-05 ENCOUNTER — Other Ambulatory Visit: Payer: Self-pay

## 2020-11-05 DIAGNOSIS — M25512 Pain in left shoulder: Secondary | ICD-10-CM

## 2020-11-17 ENCOUNTER — Other Ambulatory Visit: Payer: Self-pay | Admitting: Family

## 2020-11-17 DIAGNOSIS — E1165 Type 2 diabetes mellitus with hyperglycemia: Secondary | ICD-10-CM

## 2020-11-21 ENCOUNTER — Other Ambulatory Visit: Payer: Self-pay | Admitting: Family Medicine

## 2020-11-21 DIAGNOSIS — E785 Hyperlipidemia, unspecified: Secondary | ICD-10-CM

## 2020-11-21 DIAGNOSIS — E119 Type 2 diabetes mellitus without complications: Secondary | ICD-10-CM

## 2020-11-21 MED ORDER — EMPAGLIFLOZIN 25 MG PO TABS: 25.0000 mg | ORAL_TABLET | Freq: Every day | ORAL | 1 refills | Status: DC

## 2020-11-21 MED ORDER — ROSUVASTATIN CALCIUM 20 MG PO TABS: 20.0000 mg | ORAL_TABLET | Freq: Every day | ORAL | 1 refills | Status: DC

## 2020-11-21 NOTE — Telephone Encounter (Signed)
Left VM to rtn call. Dm/cma       

## 2020-11-21 NOTE — Telephone Encounter (Signed)
Spoke to patient, he would like for [place] to call them to see where they are at with the surgery and what his A1C was that they redid. He has left messages with no response.  Dm/cma

## 2020-11-22 ENCOUNTER — Other Ambulatory Visit: Payer: Self-pay | Admitting: Family Medicine

## 2020-11-22 DIAGNOSIS — E785 Hyperlipidemia, unspecified: Secondary | ICD-10-CM

## 2020-11-22 DIAGNOSIS — E119 Type 2 diabetes mellitus without complications: Secondary | ICD-10-CM

## 2020-12-04 ENCOUNTER — Ambulatory Visit (INDEPENDENT_AMBULATORY_CARE_PROVIDER_SITE_OTHER): Payer: BC Managed Care – PPO

## 2020-12-04 DIAGNOSIS — J309 Allergic rhinitis, unspecified: Secondary | ICD-10-CM

## 2020-12-10 NOTE — Progress Notes (Signed)
FOLLOW UP Date of Service/Encounter:  12/11/20   Subjective:  Shaun Velasquez (DOB: Dec 12, 1960) is a 60 y.o. male who returns to the [facility] on 12/11/2020 in re-evaluation of the following: allergic rhinitis and mild persistent asthma History obtained from: chart review and patient.  For Review, LV was on 11/15/19  with Dr. Maudie Mercury seen for mild persistent asthma (Alvesco 160 mcg 1 puff BID during flares), perennial seasonal allergic rhinitis (SCIT, Singulair, AH, flonase).   FEV1 last visit 99% predicted with 77% ratio  Occasionally will take singulair for flares during April through June.  Occasionally will use AH and flonase during that same period. Asthma has been historiccaly related to his allergies.  He has not used an inhaler in over 2 years.  If he does use it, it is usually during the Spring/Summer months when his allergies peak.  He is very pleased with his response to allergy shots.  He has noticed a significant improvement and has always tolerated his shots.  He would like to discuss discontinuation of shots after he finishes his current vial since he has completed > 5 years of therapy.  Previous diagnostics/pertinent history:  On SCIT/AIT since 10/29/2015 ([facility] [facility]); coming monthly on maintenance since 2020 Allergies as of 12/11/2020   No Known Allergies      Medication List        Accurate as of December 11, 2020 11:44 AM. If you have any questions, ask your nurse or doctor.          empagliflozin 25 MG Tabs tablet Commonly known as: Jardiance Take 1 tablet (25 mg total) by mouth daily.   fluticasone 50 MCG/ACT nasal spray Commonly known as: FLONASE Place 2 sprays into both nostrils daily. What changed:  when to take this reasons to take this   glucose blood test strip Please check blood sugar once a day due to fluctuating blood sugars.   levothyroxine 175 MCG tablet Commonly known as: SYNTHROID Take 1 tablet (175 mcg  total) by mouth daily before breakfast.   metFORMIN 500 MG tablet Commonly known as: GLUCOPHAGE TAKE 2 TABLETS BY MOUTH TWICE A DAY WITH MEALS   montelukast 10 MG tablet Commonly known as: SINGULAIR TAKE 1 TABLET BY MOUTH EVERYDAY AT BEDTIME What changed: See the new instructions.   multivitamin with minerals Tabs tablet Take 1 tablet by mouth daily.   naproxen 500 MG tablet Commonly known as: NAPROSYN   rosuvastatin 20 MG tablet Commonly known as: Crestor Take 1 tablet (20 mg total) by mouth daily.   Skyrizi Pen 150 MG/ML Soaj Generic drug: Risankizumab-rzaa   testosterone cypionate 200 MG/ML injection Commonly known as: DEPOTESTOSTERONE CYPIONATE PLEASE SEE ATTACHED FOR DETAILED DIRECTIONS   Triamcinolone Acetonide 0.025 % Lotn       Past Medical History:  Diagnosis Date   Allergy    Anxiety    Arthritis    Collapsed lung    Diabetes mellitus without complication (HCC)    Eczema    GERD (gastroesophageal reflux disease)    History of chickenpox    Hyperlipemia    Hypothyroidism    PONV (postoperative nausea and vomiting)    Psoriasis    Uncontrolled diabetes mellitus type 2 without complications 08/17/7617   Past Surgical History:  Procedure Laterality Date   CHEST TUBE INSERTION     pt had collapsed lung   COLONOSCOPY  2011   on [address], not sure name of the practice(normal)   COLONOSCOPY W/  POLYPECTOMY     ELBOW SURGERY Right 2009   INCISION AND DRAINAGE PERIRECTAL ABSCESS     REVERSE SHOULDER ARTHROPLASTY Right 12/27/2014   Procedure: RIGHT REVERSE SHOULDER ARTHROPLASTY;  Surgeon: Justice Britain, MD;  Location: [facility];  Service: Orthopedics;  Laterality: Right;   ROTATOR CUFF REPAIR Right 2011   SHOULDER ARTHROSCOPY W/ LABRAL REPAIR Right    SHOULDER ARTHROSCOPY WITH ROTATOR CUFF REPAIR Left 2003   SHOULDER ARTHROSCOPY WITH SUBACROMIAL DECOMPRESSION Left    Otherwise, there have been no changes to his past medical history, surgical history,  family history, or social history.  ROS: All others negative except as noted per HPI.   Objective:  BP 120/62   Pulse 80   Temp 97.7 F (36.5 C) (Temporal)   Resp (!) 24   Ht 5\' 9"  (1.753 m)   Wt 198 lb (89.8 kg)   SpO2 98%   BMI 29.24 kg/m  Body mass index is 29.24 kg/m. Physical Exam: General Appearance:  Alert, cooperative, no distress, appears stated age  Head:  Normocephalic, without obvious abnormality, atraumatic  Eyes:  Conjunctiva clear, EOM's intact  Nose: Nares normal, normal nares and mucosa  Throat: Lips, tongue normal; teeth and gums normal, normal posterior oropharynx  Neck: Supple, symmetrical  Lungs:   Clear to auscultation bilaterally, Respirations unlabored, no coughing  Heart:  RRR, no murmur, Appears well perfused  Extremities: No edema  Skin: Skin color, texture, turgor normal, no rashes or lesions on visualized portions of skin  Neurologic: No gross deficits    Spirometry:  Tracings reviewed. His effort: Good reproducible efforts. FVC: 4.57 L FEV1: 3.82 L, 106% predicted FEV1/FVC ratio: 112% Interpretation: Spirometry consistent with normal pattern..  Please see scanned spirometry results for details.  Assessment/Plan   Patient Instructions  Asthma: Normal breathing test today. Daily controller medication(s): NONE During upper respiratory infections/asthma flares: Start Alvesco 134mcg 1 puffs twice a day for 2 weeks and rinse mouth afterwards. Sample provided today in clinic. May use albuterol rescue inhaler 2 puffs every 4 to 6 hours as needed for shortness of breath, chest tightness, coughing, and wheezing. May use albuterol rescue inhaler 2 puffs 5 to 15 minutes prior to strenuous physical activities. Monitor frequency of use.  Asthma control goals:  Full participation in all desired activities (may need albuterol before activity) Albuterol use two times or less a week on average (not counting use with activity) Cough interfering with sleep  two times or less a month Oral steroids no more than once a year No hospitalizations  Environmental allergies: Continue allergy injections until this vial is complete.  Then we will stop as you have completed the recommend 5 years with EXCELLENT response! May use Singulair 10mg  daily (montelukast) as needed - this helps with asthma and allergies. Start when the coughing starts again. May use over the counter antihistamines such as Zyrtec (cetirizine), Claritin (loratadine), Allegra (fexofenadine), or Xyzal (levocetirizine) daily as needed. May take twice a day during flares. May use Flonase (fluticasone) nasal spray 1 spray per nostril twice a day as needed for nasal congestion.   Follow up in 1 year or sooner if needed.  It was a pleasure meeting you in clinic today! If you receive a survey about today's experience, please consider giving [place] feedback on how we're doing!  Sigurd Sos, MD [facility]   Sigurd Sos, MD  [facility]

## 2020-12-11 ENCOUNTER — Telehealth: Payer: Self-pay

## 2020-12-11 ENCOUNTER — Ambulatory Visit (INDEPENDENT_AMBULATORY_CARE_PROVIDER_SITE_OTHER): Payer: BC Managed Care – PPO | Admitting: Internal Medicine

## 2020-12-11 ENCOUNTER — Other Ambulatory Visit: Payer: Self-pay

## 2020-12-11 ENCOUNTER — Encounter: Payer: Self-pay | Admitting: Internal Medicine

## 2020-12-11 ENCOUNTER — Ambulatory Visit: Payer: Self-pay

## 2020-12-11 VITALS — BP 120/62 | HR 80 | Temp 97.7°F | Resp 24 | Ht 69.0 in | Wt 198.0 lb

## 2020-12-11 DIAGNOSIS — J4530 Mild persistent asthma, uncomplicated: Secondary | ICD-10-CM | POA: Diagnosis not present

## 2020-12-11 DIAGNOSIS — J309 Allergic rhinitis, unspecified: Secondary | ICD-10-CM

## 2020-12-11 MED ORDER — ALBUTEROL SULFATE HFA 108 (90 BASE) MCG/ACT IN AERS: 2.0000 | INHALATION_SPRAY | Freq: Four times a day (QID) | RESPIRATORY_TRACT | 2 refills | Status: AC | PRN

## 2020-12-11 NOTE — Telephone Encounter (Signed)
Patient signed discontinue paperwork for immunotherapy shots today at his 12/11/20 visit. He would like to complete his vial before stopping.

## 2020-12-11 NOTE — Patient Instructions (Signed)
Asthma: Normal breathing test today. Daily controller medication(s): NONE During upper respiratory infections/asthma flares: Start Alvesco 156mcg 1 puffs twice a day for 2 weeks and rinse mouth afterwards. May use albuterol rescue inhaler 2 puffs every 4 to 6 hours as needed for shortness of breath, chest tightness, coughing, and wheezing. May use albuterol rescue inhaler 2 puffs 5 to 15 minutes prior to strenuous physical activities. Monitor frequency of use.  Asthma control goals:  Full participation in all desired activities (may need albuterol before activity) Albuterol use two times or less a week on average (not counting use with activity) Cough interfering with sleep two times or less a month Oral steroids no more than once a year No hospitalizations  Environmental allergies: Continue allergy injections until this vial is complete.  Then we will stop as you have completed the recommend 5 years with EXCELLENT response! May use Singulair 10mg  daily (montelukast) as needed - this helps with asthma and allergies. Start when the coughing starts again. May use over the counter antihistamines such as Zyrtec (cetirizine), Claritin (loratadine), Allegra (fexofenadine), or Xyzal (levocetirizine) daily as needed. May take twice a day during flares. May use Flonase (fluticasone) nasal spray 1 spray per nostril twice a day as needed for nasal congestion.   Follow up in 1 year or sooner if needed.  It was a pleasure meeting you in clinic today! If you receive a survey about today's experience, please consider giving [place] feedback on how we're doing!  Sigurd Sos, MD [facility]

## 2020-12-11 NOTE — Telephone Encounter (Signed)
A copy has been placed in bulk scanning at the [facility] office and Dr. Simona Huh has a copy to give to Marcie Bal on 11/31/22 since the fax machine is not working at the [place] office.

## 2020-12-24 ENCOUNTER — Ambulatory Visit (INDEPENDENT_AMBULATORY_CARE_PROVIDER_SITE_OTHER): Payer: BC Managed Care – PPO | Admitting: *Deleted

## 2020-12-24 DIAGNOSIS — J309 Allergic rhinitis, unspecified: Secondary | ICD-10-CM

## 2020-12-27 ENCOUNTER — Ambulatory Visit (INDEPENDENT_AMBULATORY_CARE_PROVIDER_SITE_OTHER): Payer: BC Managed Care – PPO | Admitting: Family Medicine

## 2020-12-27 ENCOUNTER — Other Ambulatory Visit: Payer: Self-pay

## 2020-12-27 ENCOUNTER — Encounter: Payer: Self-pay | Admitting: Family Medicine

## 2020-12-27 VITALS — BP 118/70 | HR 82 | Temp 97.9°F | Ht 69.0 in | Wt 202.8 lb

## 2020-12-27 DIAGNOSIS — E119 Type 2 diabetes mellitus without complications: Secondary | ICD-10-CM | POA: Diagnosis not present

## 2020-12-27 DIAGNOSIS — E039 Hypothyroidism, unspecified: Secondary | ICD-10-CM | POA: Diagnosis not present

## 2020-12-27 DIAGNOSIS — E785 Hyperlipidemia, unspecified: Secondary | ICD-10-CM

## 2020-12-27 DIAGNOSIS — Z96612 Presence of left artificial shoulder joint: Secondary | ICD-10-CM

## 2020-12-27 NOTE — Progress Notes (Signed)
[facility] [address] [place] [address] Dept: [phone] Dept Fax: [phone]  Chronic Care Office Visit  Subjective:    Patient ID: Shaun Velasquez, male    DOB: 11/26/1960, 60 y.o..   MRN: [id number]  Chief Complaint  Patient presents with   Follow-up    3 month f/u, no concerns.          History of Present Illness:  Patient is in today for reassessment of chronic medical issues.  Shaun Velasquez has a history of Type 2 diabetes. He is managed on metformin and Jardiance. He notes he had an A1c done in Nov. which was at 7.4%.   Shaun Velasquez has a history of  hyperlipidemia. He is managed on rosuvastatin.   Shaun Velasquez has a history of hypothyroidism, managed on Synthroid.   Shaun Velasquez has had a left shoulder joint replacement on 12/5 performed by Shaun Velasquez (orthopedics). He notes he is recovering well. His incision has been healing without problems. He is progressing with PT. 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   Past Surgical History:  Procedure Laterality Date   CHEST TUBE INSERTION     pt had collapsed lung   COLONOSCOPY  2011   on [address], not sure name of the practice(normal)   COLONOSCOPY W/ POLYPECTOMY     ELBOW SURGERY Right 2009   INCISION AND DRAINAGE PERIRECTAL ABSCESS     REVERSE SHOULDER ARTHROPLASTY Right 12/27/2014   Procedure: RIGHT REVERSE SHOULDER ARTHROPLASTY;  Surgeon: Justice Britain, MD;  Location: [facility];  Service: Orthopedics;   Laterality: Right;   ROTATOR CUFF REPAIR Right 2011   SHOULDER ARTHROSCOPY W/ LABRAL REPAIR Right    SHOULDER ARTHROSCOPY WITH ROTATOR CUFF REPAIR Left 2003   SHOULDER ARTHROSCOPY WITH SUBACROMIAL DECOMPRESSION Left    Family History  Problem Relation Age of Onset   Allergic rhinitis Mother    Lung cancer Mother    Asthma Sister    Cancer Sister        Skin   Colon cancer Neg Hx    Colon polyps Neg Hx    Esophageal cancer Neg Hx    Stomach cancer Neg Hx    Rectal cancer Neg Hx    Outpatient Medications Prior to Visit  Medication Sig Dispense Refill   ACETAMINOPHEN EXTRA STRENGTH 500 MG tablet Take 1,000 mg by mouth every 8 (eight) hours as needed.     albuterol (VENTOLIN HFA) 108 (90 Base) MCG/ACT inhaler Inhale 2 puffs into the lungs every 6 (six) hours as needed for wheezing or shortness of breath. 8 g 2   Aspirin 81 MG CAPS Aspirin 81 mg     empagliflozin (JARDIANCE) 25 MG TABS tablet Take 1 tablet (25 mg total) by mouth daily. 90 tablet 1   fluticasone (FLONASE) 50 MCG/ACT nasal spray Place 2 sprays into both nostrils daily. (Patient taking differently: Place 2 sprays into both nostrils daily as needed.) 16 g 5   glucose blood test strip Please check blood sugar once a day due  to fluctuating blood sugars.     levothyroxine (SYNTHROID) 175 MCG tablet Take 1 tablet (175 mcg total) by mouth daily before breakfast. 90 tablet 2   meloxicam (MOBIC) 15 MG tablet Take 15 mg by mouth daily as needed.     metFORMIN (GLUCOPHAGE) 500 MG tablet TAKE 2 TABLETS BY MOUTH TWICE A DAY WITH MEALS 120 tablet 1   montelukast (SINGULAIR) 10 MG tablet TAKE 1 TABLET BY MOUTH EVERYDAY AT BEDTIME (Patient taking differently: daily as needed.) 30 tablet 5   Multiple Vitamin (MULTIVITAMIN WITH MINERALS) TABS tablet Take 1 tablet by mouth daily.     oxyCODONE (OXY IR/ROXICODONE) 5 MG immediate release tablet Take 5-10 mg by mouth every 6 (six) hours as needed.     rosuvastatin (CRESTOR) 20 MG tablet Take 1  tablet (20 mg total) by mouth daily. 90 tablet 1   SKYRIZI PEN 150 MG/ML SOAJ      testosterone cypionate (DEPOTESTOSTERONE CYPIONATE) 200 MG/ML injection PLEASE SEE ATTACHED FOR DETAILED DIRECTIONS     naproxen (NAPROSYN) 500 MG tablet  (Patient not taking: Reported on 12/27/2020)     Triamcinolone Acetonide 0.025 % LOTN  (Patient not taking: Reported on 12/11/2020)     No facility-administered medications prior to visit.   No Known Allergies    Objective:   Today's Vitals   12/27/20 1433  BP: 118/70  Pulse: 82  Temp: 97.9 F (36.6 C)  TempSrc: Temporal  SpO2: 96%  Weight: 202 lb 12.8 oz (92 kg)  Height: 5\' 9"  (1.753 m)   Body mass index is 29.95 kg/m.   General: Well developed, well nourished. No acute distress. Extremities: Incision is clean and dry. Limited ROM with left arm looks good. Patient has arm in shoulder   brace/sling. Psych: Alert and oriented. Normal mood and affect.  Health Maintenance Due  Topic Date Due   HIV Screening  Never done   Zoster Vaccines- Shingrix (1 of 2) Never done   Pneumococcal Vaccine 10-48 Years old (2 - PCV) 10/09/2017   COVID-19 Vaccine (3 - Booster for Pfizer series) 07/27/2019   URINE MICROALBUMIN  02/04/2021   Lab Results Last lipids Lab Results  Component Value Date   CHOL 161 07/01/2020   HDL 54.60 07/01/2020   LDLCALC 88 07/01/2020   LDLDIRECT 218.0 07/02/2017   TRIG 94.0 07/01/2020   CHOLHDL 3 07/01/2020   Last hemoglobin A1c Lab Results  Component Value Date   HGBA1C 7.8 (H) 10/23/2020   Last thyroid functions Lab Results  Component Value Date   TSH 0.37 10/23/2020   Assessment & Plan:   1. Status post replacement of left shoulder joint Healing well and progressing with PT. Continue to follow with Shaun Velasquez.  2. Hypothyroidism, unspecified type TSH at goal in Oct. We will reassess this at his next visit. Continue Synthroid.  3. Controlled type 2 diabetes mellitus without complication, without long-term  current use of insulin (HCC) A1c is improving since he restarted his metformin. He will continue this medication, in addition to [place].   4. Hyperlipidemia, unspecified hyperlipidemia type Lipids at goal last June. We will reassess at his next visit.  Velasquez Salter, MD

## 2021-01-07 ENCOUNTER — Ambulatory Visit (INDEPENDENT_AMBULATORY_CARE_PROVIDER_SITE_OTHER): Payer: BC Managed Care – PPO | Admitting: *Deleted

## 2021-01-07 DIAGNOSIS — J309 Allergic rhinitis, unspecified: Secondary | ICD-10-CM

## 2021-01-16 ENCOUNTER — Ambulatory Visit (INDEPENDENT_AMBULATORY_CARE_PROVIDER_SITE_OTHER): Payer: BC Managed Care – PPO

## 2021-01-16 DIAGNOSIS — J309 Allergic rhinitis, unspecified: Secondary | ICD-10-CM

## 2021-01-23 ENCOUNTER — Ambulatory Visit (INDEPENDENT_AMBULATORY_CARE_PROVIDER_SITE_OTHER): Payer: BC Managed Care – PPO

## 2021-01-23 DIAGNOSIS — J309 Allergic rhinitis, unspecified: Secondary | ICD-10-CM

## 2021-01-24 ENCOUNTER — Ambulatory Visit: Payer: BC Managed Care – PPO | Admitting: Family Medicine

## 2021-01-26 ENCOUNTER — Encounter: Payer: Self-pay | Admitting: Family Medicine

## 2021-01-26 DIAGNOSIS — E1165 Type 2 diabetes mellitus with hyperglycemia: Secondary | ICD-10-CM

## 2021-01-26 DIAGNOSIS — E039 Hypothyroidism, unspecified: Secondary | ICD-10-CM

## 2021-01-27 MED ORDER — LEVOTHYROXINE SODIUM 175 MCG PO TABS: 175.0000 ug | ORAL_TABLET | Freq: Every day | ORAL | 2 refills | Status: DC

## 2021-01-27 MED ORDER — METFORMIN HCL 500 MG PO TABS: ORAL_TABLET | ORAL | 1 refills | Status: DC

## 2021-01-29 ENCOUNTER — Encounter: Payer: Self-pay | Admitting: Family Medicine

## 2021-02-20 ENCOUNTER — Ambulatory Visit (INDEPENDENT_AMBULATORY_CARE_PROVIDER_SITE_OTHER): Payer: BC Managed Care – PPO

## 2021-02-20 DIAGNOSIS — J309 Allergic rhinitis, unspecified: Secondary | ICD-10-CM

## 2021-03-12 ENCOUNTER — Encounter: Payer: Self-pay | Admitting: Nurse Practitioner

## 2021-03-12 ENCOUNTER — Other Ambulatory Visit: Payer: Self-pay

## 2021-03-12 ENCOUNTER — Ambulatory Visit (INDEPENDENT_AMBULATORY_CARE_PROVIDER_SITE_OTHER): Payer: BC Managed Care – PPO | Admitting: Nurse Practitioner

## 2021-03-12 VITALS — BP 136/82 | HR 103 | Temp 99.1°F | Wt 204.2 lb

## 2021-03-12 DIAGNOSIS — J069 Acute upper respiratory infection, unspecified: Secondary | ICD-10-CM

## 2021-03-12 LAB — POCT INFLUENZA A/B
Influenza A, POC: NEGATIVE
Influenza B, POC: NEGATIVE

## 2021-03-12 MED ORDER — BENZONATATE 100 MG PO CAPS: 100.0000 mg | ORAL_CAPSULE | Freq: Two times a day (BID) | ORAL | 0 refills | Status: DC | PRN

## 2021-03-12 MED ORDER — PREDNISONE 10 MG PO TABS: ORAL_TABLET | ORAL | 0 refills | Status: DC

## 2021-03-12 MED ORDER — HYDROCOD POLI-CHLORPHE POLI ER 10-8 MG/5ML PO SUER: 5.0000 mL | Freq: Every evening | ORAL | 0 refills | Status: DC | PRN

## 2021-03-12 NOTE — Progress Notes (Signed)
Acute Office Visit  Subjective:    Patient ID: Shaun Velasquez, male    DOB: 1961-01-04, 61 y.o.   MRN: [id number]  Chief Complaint  Patient presents with   URI    Pt c/o congestion, body aches, intermittent fever 99-102, and cough x2 days. Neg covid test     HPI Patient is in today for body aches, congestion, fever, and cough for 2 days. Home covid-19 test was negative.  UPPER RESPIRATORY TRACT INFECTION  Fever: yes Cough: yes Shortness of breath: yes Wheezing: no Chest pain: no Chest tightness: no Chest congestion: yes Nasal congestion: yes Runny nose: yes Post nasal drip: yes Sneezing: yes Sore throat: no Swollen glands: no Sinus pressure: no Headache: no Face pain: no Toothache: no Ear pain: no bilateral Ear pressure: no bilateral Eyes red/itching:no Eye drainage/crusting: yes - watery Vomiting: yes Rash: no Fatigue: yes Sick contacts: yes wife, daughter Strep contacts: no  Context: fluctuating Recurrent sinusitis: no Relief with OTC cold/cough medications: no  Treatments attempted: benadryl, sudafed, dayquil, nyquil, tylenol, advil    Past Medical History:  Diagnosis Date   Allergy    Anxiety    Arthritis    Collapsed lung    Diabetes mellitus without complication (HCC)    Eczema    GERD (gastroesophageal reflux disease)    History of chickenpox    Hyperlipemia    Hypothyroidism    PONV (postoperative nausea and vomiting)    Psoriasis    Uncontrolled diabetes mellitus type 2 without complications 03/13/5174    Past Surgical History:  Procedure Laterality Date   CHEST TUBE INSERTION     pt had collapsed lung   COLONOSCOPY  2011   on [address], not sure name of the practice(normal)   COLONOSCOPY W/ POLYPECTOMY     ELBOW SURGERY Right 2009   INCISION AND DRAINAGE PERIRECTAL ABSCESS     REVERSE SHOULDER ARTHROPLASTY Right 12/27/2014   Procedure: RIGHT REVERSE SHOULDER ARTHROPLASTY;  Surgeon: Justice Britain, MD;  Location: [facility];  Service:  Orthopedics;  Laterality: Right;   ROTATOR CUFF REPAIR Right 2011   SHOULDER ARTHROSCOPY W/ LABRAL REPAIR Right    SHOULDER ARTHROSCOPY WITH ROTATOR CUFF REPAIR Left 2003   SHOULDER ARTHROSCOPY WITH SUBACROMIAL DECOMPRESSION Left     Family History  Problem Relation Age of Onset   Allergic rhinitis Mother    Lung cancer Mother    Asthma Sister    Cancer Sister        Skin   Colon cancer Neg Hx    Colon polyps Neg Hx    Esophageal cancer Neg Hx    Stomach cancer Neg Hx    Rectal cancer Neg Hx     Social History   Socioeconomic History   Marital status: Married    Spouse name: Not on file   Number of children: 2   Years of education: Not on file   Highest education level: Not on file  Occupational History   Occupation: Technology support    Comment: [employer]  Tobacco Use   Smoking status: Former    Years: 15.00    Types: Cigarettes    Quit date: 1996    Years since quitting: 27.1   Smokeless tobacco: Never  Vaping Use   Vaping Use: Never used  Substance and Sexual Activity   Alcohol use: No   Drug use: No   Sexual activity: Yes  Other Topics Concern   Not on file  Social History Narrative  Not on file   Social Determinants of Health   Financial Resource Strain: Not on file  Food Insecurity: Not on file  Transportation Needs: Not on file  Physical Activity: Not on file  Stress: Not on file  Social Connections: Not on file  Intimate Partner Violence: Not on file    Outpatient Medications Prior to Visit  Medication Sig Dispense Refill   ACETAMINOPHEN EXTRA STRENGTH 500 MG tablet Take 1,000 mg by mouth every 8 (eight) hours as needed.     albuterol (VENTOLIN HFA) 108 (90 Base) MCG/ACT inhaler Inhale 2 puffs into the lungs every 6 (six) hours as needed for wheezing or shortness of breath. 8 g 2   Aspirin 81 MG CAPS Aspirin 81 mg     empagliflozin (JARDIANCE) 25 MG TABS tablet Take 1 tablet (25 mg total) by mouth daily. 90 tablet 1    fluticasone (FLONASE) 50 MCG/ACT nasal spray Place 2 sprays into both nostrils daily. (Patient taking differently: Place 2 sprays into both nostrils daily as needed.) 16 g 5   glucose blood test strip Please check blood sugar once a day due to fluctuating blood sugars.     levothyroxine (SYNTHROID) 175 MCG tablet Take 1 tablet (175 mcg total) by mouth daily before breakfast. 90 tablet 2   meloxicam (MOBIC) 15 MG tablet Take 15 mg by mouth daily as needed.     metFORMIN (GLUCOPHAGE) 500 MG tablet Take 2 tablets by mouth twice a day with meals 120 tablet 1   montelukast (SINGULAIR) 10 MG tablet TAKE 1 TABLET BY MOUTH EVERYDAY AT BEDTIME (Patient taking differently: daily as needed.) 30 tablet 5   Multiple Vitamin (MULTIVITAMIN WITH MINERALS) TABS tablet Take 1 tablet by mouth daily.     oxyCODONE (OXY IR/ROXICODONE) 5 MG immediate release tablet Take 5-10 mg by mouth every 6 (six) hours as needed.     rosuvastatin (CRESTOR) 20 MG tablet Take 1 tablet (20 mg total) by mouth daily. 90 tablet 1   SKYRIZI PEN 150 MG/ML SOAJ      testosterone cypionate (DEPOTESTOSTERONE CYPIONATE) 200 MG/ML injection PLEASE SEE ATTACHED FOR DETAILED DIRECTIONS     No facility-administered medications prior to visit.    No Known Allergies  Review of Systems See pertinent positives and negatives per HPI.    Objective:    Physical Exam Vitals and nursing note reviewed.  Constitutional:      Appearance: Normal appearance.  HENT:     Head: Normocephalic.     Right Ear: Tympanic membrane, ear canal and external ear normal.     Left Ear: Tympanic membrane, ear canal and external ear normal.  Eyes:     Conjunctiva/sclera: Conjunctivae normal.  Cardiovascular:     Rate and Rhythm: Normal rate and regular rhythm.     Pulses: Normal pulses.     Heart sounds: Normal heart sounds.  Pulmonary:     Effort: Pulmonary effort is normal.     Breath sounds: Normal breath sounds.  Musculoskeletal:     Cervical back:  Normal range of motion and neck supple. No tenderness.  Lymphadenopathy:     Cervical: No cervical adenopathy.  Skin:    General: Skin is warm.  Neurological:     General: No focal deficit present.     Mental Status: He is alert and oriented to person, place, and time.  Psychiatric:        Mood and Affect: Mood normal.        Behavior: Behavior normal.  Thought Content: Thought content normal.        Judgment: Judgment normal.    BP 136/82    Pulse (!) 103    Temp 99.1 F (37.3 C) (Oral)    Wt 204 lb 3.2 oz (92.6 kg)    SpO2 95%    BMI 30.16 kg/m  Wt Readings from Last 3 Encounters:  03/12/21 204 lb 3.2 oz (92.6 kg)  12/27/20 202 lb 12.8 oz (92 kg)  12/11/20 198 lb (89.8 kg)    Health Maintenance Due  Topic Date Due   HIV Screening  Never done   Zoster Vaccines- Shingrix (1 of 2) Never done   COVID-19 Vaccine (3 - Booster for Pfizer series) 07/27/2019   URINE MICROALBUMIN  02/04/2021    There are no preventive care reminders to display for this patient.   Lab Results  Component Value Date   TSH 0.37 10/23/2020   Lab Results  Component Value Date   WBC 6.7 10/23/2020   HGB 14.8 10/23/2020   HCT 44.1 10/23/2020   MCV 85.9 10/23/2020   PLT 219.0 10/23/2020   Lab Results  Component Value Date   NA 135 10/23/2020   K 4.0 10/23/2020   CO2 29 10/23/2020   GLUCOSE 88 10/23/2020   BUN 18 10/23/2020   CREATININE 0.84 10/23/2020   BILITOT 0.6 10/23/2020   ALKPHOS 75 10/23/2020   AST 18 10/23/2020   ALT 19 10/23/2020   PROT 7.2 10/23/2020   ALBUMIN 4.6 10/23/2020   CALCIUM 10.0 10/23/2020   ANIONGAP 10 12/14/2014   GFR 94.14 10/23/2020   Lab Results  Component Value Date   CHOL 161 07/01/2020   Lab Results  Component Value Date   HDL 54.60 07/01/2020   Lab Results  Component Value Date   LDLCALC 88 07/01/2020   Lab Results  Component Value Date   TRIG 94.0 07/01/2020   Lab Results  Component Value Date   CHOLHDL 3 07/01/2020   Lab Results   Component Value Date   HGBA1C 7.8 (H) 10/23/2020       Assessment & Plan:   Problem List Items Addressed This Visit   None Visit Diagnoses     Upper respiratory tract infection, unspecified type    -  Primary   Rapid flu negative. Send out flu/RSV/covid-19. Encourage rest, fluids. Start prednsione taper in am with food. Tessalon and tussionex prn cough. PDMP reviewed.    Relevant Orders   POCT Influenza A/B   COVID-19, Flu A+B and RSV        Meds ordered this encounter  Medications   predniSONE (DELTASONE) 10 MG tablet    Sig: Take 6 tablets today, then 5 tablets tomorrow, then decrease by 1 tablet every day until gone    Dispense:  21 tablet    Refill:  0   benzonatate (TESSALON) 100 MG capsule    Sig: Take 1 capsule (100 mg total) by mouth 2 (two) times daily as needed for cough.    Dispense:  20 capsule    Refill:  0   chlorpheniramine-HYDROcodone (TUSSIONEX PENNKINETIC ER) 10-8 MG/5ML    Sig: Take 5 mLs by mouth at bedtime as needed for cough.    Dispense:  50 mL    Refill:  0     Charyl Dancer, NP

## 2021-03-12 NOTE — Patient Instructions (Signed)
It was great to see you! ? ?Start prednisone taper, 6 tablets today, 5 tablets tomorrow, then decrease by 1 every day until gone. Take in the morning with food. Use your albuterol inhaler every 6 hours as needed for shortness of breath. You can take tessalon as needed for cough and tussionex as needed for cough at bedtime. Make sure you drink plenty of fluids and get rest.  ? ?Let's follow-up if your symptoms don't improve or worsen.  ? ?Take care, ? ?Vance Peper, NP ? ?

## 2021-03-15 LAB — COVID-19, FLU A+B AND RSV
Influenza A, NAA: NOT DETECTED
Influenza B, NAA: NOT DETECTED
RSV, NAA: NOT DETECTED
SARS-CoV-2, NAA: NOT DETECTED

## 2021-03-21 ENCOUNTER — Ambulatory Visit: Payer: BC Managed Care – PPO | Admitting: Family Medicine

## 2021-03-25 ENCOUNTER — Ambulatory Visit (INDEPENDENT_AMBULATORY_CARE_PROVIDER_SITE_OTHER): Payer: BC Managed Care – PPO

## 2021-03-25 DIAGNOSIS — J309 Allergic rhinitis, unspecified: Secondary | ICD-10-CM | POA: Diagnosis not present

## 2021-03-30 ENCOUNTER — Other Ambulatory Visit: Payer: Self-pay | Admitting: Family Medicine

## 2021-03-30 DIAGNOSIS — E1165 Type 2 diabetes mellitus with hyperglycemia: Secondary | ICD-10-CM

## 2021-04-11 ENCOUNTER — Ambulatory Visit: Payer: BC Managed Care – PPO | Admitting: Family Medicine

## 2021-04-24 ENCOUNTER — Ambulatory Visit (INDEPENDENT_AMBULATORY_CARE_PROVIDER_SITE_OTHER): Payer: BC Managed Care – PPO

## 2021-04-24 DIAGNOSIS — J309 Allergic rhinitis, unspecified: Secondary | ICD-10-CM

## 2021-04-25 ENCOUNTER — Ambulatory Visit: Payer: BC Managed Care – PPO | Admitting: Family Medicine

## 2021-05-09 ENCOUNTER — Ambulatory Visit: Payer: BC Managed Care – PPO | Admitting: Family Medicine

## 2021-05-22 ENCOUNTER — Ambulatory Visit (INDEPENDENT_AMBULATORY_CARE_PROVIDER_SITE_OTHER): Payer: BC Managed Care – PPO

## 2021-05-22 DIAGNOSIS — J309 Allergic rhinitis, unspecified: Secondary | ICD-10-CM

## 2021-05-26 ENCOUNTER — Encounter: Payer: Self-pay | Admitting: Family Medicine

## 2021-05-26 ENCOUNTER — Ambulatory Visit (INDEPENDENT_AMBULATORY_CARE_PROVIDER_SITE_OTHER): Payer: BC Managed Care – PPO | Admitting: Family Medicine

## 2021-05-26 VITALS — BP 120/76 | HR 90 | Temp 97.6°F | Ht 69.0 in | Wt 202.4 lb

## 2021-05-26 DIAGNOSIS — E119 Type 2 diabetes mellitus without complications: Secondary | ICD-10-CM | POA: Diagnosis not present

## 2021-05-26 DIAGNOSIS — J3089 Other allergic rhinitis: Secondary | ICD-10-CM

## 2021-05-26 DIAGNOSIS — E039 Hypothyroidism, unspecified: Secondary | ICD-10-CM | POA: Diagnosis not present

## 2021-05-26 DIAGNOSIS — E785 Hyperlipidemia, unspecified: Secondary | ICD-10-CM | POA: Diagnosis not present

## 2021-05-26 MED ORDER — FLUTICASONE PROPIONATE 50 MCG/ACT NA SUSP: 2.0000 | Freq: Every day | NASAL | 5 refills | Status: DC

## 2021-05-26 NOTE — Progress Notes (Signed)
?[facility] ?[place] ?[place] [address] ?Dept: [phone] ?Dept Fax: [phone] ? ?Chronic Care Office Visit ? ?Subjective:  ? ? Patient ID: Shaun Velasquez, male    DOB: 12-Jul-1960, 61 y.o..   MRN: [id number] ? ?Chief Complaint  ?Patient presents with  ? Follow-up  ?  3 month f/u.  No concerns.   ? ? ?History of Present Illness: ? ?Patient is in today for reassessment of chronic medical issues. ? ?Mr. Shaun Velasquez has a history of Type 2 diabetes. He is managed on metformin and Jardiance. His last A1c was 7.4%. 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 ?  ?Mr. Shaun Velasquez has a history of  hyperlipidemia. He is managed on rosuvastatin. ?  ?Mr. Shaun Velasquez has a history of hypothyroidism, managed on Synthroid. ?  ?Mr. Shaun Velasquez has had a left shoulder joint replacement on 12/16/20 performed by Dr. Griffin Basil (orthopedics). He has recovered well. ? ?Past Medical History: ?Patient Active Problem List  ? Diagnosis Date Noted  ? Psoriasis 06/24/2020  ? Seborrhea 06/24/2020  ? Testosterone deficiency 06/24/2020  ? Primary male hypogonadism 05/22/2016  ? Perennial and seasonal allergic rhinitis 08/27/2015  ? Overweight (BMI 25.0-29.9) 05/30/2015  ? Hyperlipidemia 05/19/2015  ? Hypothyroidism 05/19/2015  ? Diabetes mellitus type II, controlled, with no complications ([place]) 41/66/0630  ? S/P shoulder replacement 12/27/2014  ? ?Past Surgical History:  ?Procedure Laterality Date  ? CHEST TUBE INSERTION    ? pt had collapsed lung  ? COLONOSCOPY  2011  ? on [address], not sure name of the practice(normal)  ? COLONOSCOPY W/ POLYPECTOMY    ? ELBOW SURGERY Right 2009  ? INCISION AND DRAINAGE PERIRECTAL ABSCESS    ? REVERSE SHOULDER ARTHROPLASTY Right 12/27/2014  ? Procedure: RIGHT REVERSE SHOULDER ARTHROPLASTY;  Surgeon: Justice Britain, MD;  Location: [facility];  Service: Orthopedics;  Laterality: Right;  ? ROTATOR CUFF REPAIR Right  2011  ? SHOULDER ARTHROSCOPY W/ LABRAL REPAIR Right   ? SHOULDER ARTHROSCOPY WITH ROTATOR CUFF REPAIR Left 2003  ? SHOULDER ARTHROSCOPY WITH SUBACROMIAL DECOMPRESSION Left   ? ?Family History  ?Problem Relation Age of Onset  ? Allergic rhinitis Mother   ? Lung cancer Mother   ? Asthma Sister   ? Cancer Sister   ?     Skin  ? Colon cancer Neg Hx   ? Colon polyps Neg Hx   ? Esophageal cancer Neg Hx   ? Stomach cancer Neg Hx   ? Rectal cancer Neg Hx   ? ?Outpatient Medications Prior to Visit  ?Medication Sig Dispense Refill  ? ACETAMINOPHEN EXTRA STRENGTH 500 MG tablet Take 1,000 mg by mouth every 8 (eight) hours as needed.    ? albuterol (VENTOLIN HFA) 108 (90 Base) MCG/ACT inhaler Inhale 2 puffs into the lungs every 6 (six) hours as needed for wheezing or shortness of breath. 8 g 2  ? empagliflozin (JARDIANCE) 25 MG TABS tablet Take 1 tablet (25 mg total) by mouth daily. 90 tablet 1  ? glucose blood test strip Please check blood sugar once a day due to fluctuating blood sugars.    ? levothyroxine (SYNTHROID) 175 MCG tablet Take 1 tablet (175 mcg total) by mouth daily before breakfast. 90 tablet 2  ? meloxicam (MOBIC) 15 MG tablet Take 15 mg by mouth daily as needed.    ? metFORMIN (GLUCOPHAGE) 500 MG tablet TAKE 2 TABLETS BY MOUTH TWICE  DAILY WITH MEALS 120 tablet 1  ? montelukast (SINGULAIR) 10 MG tablet TAKE 1 TABLET BY MOUTH EVERYDAY AT BEDTIME (Patient taking differently: daily as needed.) 30 tablet 5  ? Multiple Vitamin (MULTIVITAMIN WITH MINERALS) TABS tablet Take 1 tablet by mouth daily.    ? rosuvastatin (CRESTOR) 20 MG tablet Take 1 tablet (20 mg total) by mouth daily. 90 tablet 1  ? SKYRIZI PEN 150 MG/ML SOAJ     ? testosterone cypionate (DEPOTESTOSTERONE CYPIONATE) 200 MG/ML injection PLEASE SEE ATTACHED FOR DETAILED DIRECTIONS    ? fluticasone (FLONASE) 50 MCG/ACT nasal spray Place 2 sprays into both nostrils daily. (Patient taking differently: Place 2 sprays into both nostrils daily as needed.) 16 g 5  ?  Aspirin 81 MG CAPS Aspirin 81 mg    ? benzonatate (TESSALON) 100 MG capsule Take 1 capsule (100 mg total) by mouth 2 (two) times daily as needed for cough. 20 capsule 0  ? chlorpheniramine-HYDROcodone (TUSSIONEX PENNKINETIC ER) 10-8 MG/5ML Take 5 mLs by mouth at bedtime as needed for cough. 50 mL 0  ? oxyCODONE (OXY IR/ROXICODONE) 5 MG immediate release tablet Take 5-10 mg by mouth every 6 (six) hours as needed.    ? predniSONE (DELTASONE) 10 MG tablet Take 6 tablets today, then 5 tablets tomorrow, then decrease by 1 tablet every day until gone 21 tablet 0  ? ?No facility-administered medications prior to visit.  ? ?No Known Allergies ?   ?Objective:  ? ?Today's Vitals  ? 05/26/21 1326  ?BP: 120/76  ?Pulse: 90  ?Temp: 97.6 ?F (36.4 ?C)  ?TempSrc: Temporal  ?SpO2: 96%  ?Weight: 202 lb 6.4 oz (91.8 kg)  ?Height: '5\' 9"'$  (1.753 m)  ? ?Body mass index is 29.89 kg/m?.  ? ?General: Well developed, well nourished. No acute distress. ?Psych: Alert and oriented. Normal mood and affect. ? ?Health Maintenance Due  ?Topic Date Due  ? HIV Screening  Never done  ? Zoster Vaccines- Shingrix (1 of 2) Never done  ? URINE MICROALBUMIN  02/04/2021  ? OPHTHALMOLOGY EXAM  04/12/2021  ? HEMOGLOBIN A1C  04/23/2021  ?   ?Assessment & Plan:  ? ?1. Controlled type 2 diabetes mellitus without complication, without long-term current use of insulin ([place]) ?Mr. Shaun Velasquez will continue metformin 500 mg 2 tabs bid and empagliflozin 25 mg daily. I recommend he schedule his annual eye exam. ? ?- Microalbumin / creatinine urine ratio; Future ?- Basic metabolic panel; Future ?- Hemoglobin A1c; Future ?- Urinalysis, Routine w reflex microscopic; Future ? ?2. Hypothyroidism, unspecified type ?Stable on 175 mcg of levothyroxine daily. ? ?3. Perennial and seasonal allergic rhinitis ?I will renew his Flonase spray. ? ?- fluticasone (FLONASE) 50 MCG/ACT nasal spray; Place 2 sprays into both nostrils daily.  Dispense: 16 g; Refill: 5 ? ?4. Hyperlipidemia,  unspecified hyperlipidemia type ?Plan to repeat fasting lipids. ? ?- Lipid panel; Future ? ? ?Return in about 3 months (around 08/26/2021).  ? ?Haydee Salter, MD ?

## 2021-06-02 ENCOUNTER — Other Ambulatory Visit: Payer: Self-pay | Admitting: Family Medicine

## 2021-06-02 DIAGNOSIS — E1165 Type 2 diabetes mellitus with hyperglycemia: Secondary | ICD-10-CM

## 2021-06-02 DIAGNOSIS — E119 Type 2 diabetes mellitus without complications: Secondary | ICD-10-CM

## 2021-06-02 DIAGNOSIS — E785 Hyperlipidemia, unspecified: Secondary | ICD-10-CM

## 2021-06-03 ENCOUNTER — Ambulatory Visit (INDEPENDENT_AMBULATORY_CARE_PROVIDER_SITE_OTHER): Payer: BC Managed Care – PPO | Admitting: Family Medicine

## 2021-06-03 ENCOUNTER — Encounter: Payer: Self-pay | Admitting: Family Medicine

## 2021-06-03 VITALS — BP 132/66 | HR 100 | Temp 99.5°F | Ht 69.0 in | Wt 201.4 lb

## 2021-06-03 DIAGNOSIS — M2550 Pain in unspecified joint: Secondary | ICD-10-CM

## 2021-06-03 DIAGNOSIS — W57XXXA Bitten or stung by nonvenomous insect and other nonvenomous arthropods, initial encounter: Secondary | ICD-10-CM

## 2021-06-03 DIAGNOSIS — S70361A Insect bite (nonvenomous), right thigh, initial encounter: Secondary | ICD-10-CM

## 2021-06-03 DIAGNOSIS — E785 Hyperlipidemia, unspecified: Secondary | ICD-10-CM | POA: Diagnosis not present

## 2021-06-03 DIAGNOSIS — E119 Type 2 diabetes mellitus without complications: Secondary | ICD-10-CM | POA: Diagnosis not present

## 2021-06-03 MED ORDER — DOXYCYCLINE HYCLATE 100 MG PO TABS: 100.0000 mg | ORAL_TABLET | Freq: Two times a day (BID) | ORAL | 0 refills | Status: DC

## 2021-06-03 NOTE — Progress Notes (Signed)
[facility] [address] [place] [address] Dept: [phone] Dept Fax: [phone]  Office Visit  Subjective:    Patient ID: Shaun Velasquez, male    DOB: 01-12-1961, 61 y.o..   MRN: [id number]  Chief Complaint  Patient presents with   Acute Visit    C/o having tick bite on back of leg 2 days.  Has  been feeling fatigue, joint pain, and fever off/on.     History of Present Illness:  Patient is in today having had a recent tick bite. He notes that he had a sore on his inner right thigh, first seen on Sat. Last night, he noted there was a small tick embedded at the site of the sore. He notes that he had "low grade fever" for the past 2 days, with his temps being around 75 F (he is adamant that his norm is around 23 F). He has also noted fatigue and generalized joint pains since that time. He states he had an similar episode after a tick bite 6 years ago. He was eventually treated with doxycycline. As he had some proteinuria, he was also seen by nephrology. All of this eventually resolved. He notes he had convalescent titers for Lyme that were positive.  Past Medical History: Patient Active Problem List   Diagnosis Date Noted   Psoriasis 06/24/2020   Seborrhea 06/24/2020   Testosterone deficiency 06/24/2020   Primary male hypogonadism 05/22/2016   Perennial and seasonal allergic rhinitis 08/27/2015   Overweight (BMI 25.0-29.9) 05/30/2015   Hyperlipidemia 05/19/2015   Hypothyroidism 05/19/2015   Diabetes mellitus type II, controlled, with no complications ([facility]) 94/17/4081   S/P shoulder replacement 12/27/2014   Past Surgical History:  Procedure Laterality Date   CHEST TUBE INSERTION     pt had collapsed lung   COLONOSCOPY  2011   on [address], not sure name of the practice(normal)   COLONOSCOPY W/ POLYPECTOMY     ELBOW SURGERY Right 2009   INCISION AND DRAINAGE PERIRECTAL ABSCESS     REVERSE SHOULDER ARTHROPLASTY  Right 12/27/2014   Procedure: RIGHT REVERSE SHOULDER ARTHROPLASTY;  Surgeon: Justice Britain, MD;  Location: [facility];  Service: Orthopedics;  Laterality: Right;   ROTATOR CUFF REPAIR Right 2011   SHOULDER ARTHROSCOPY W/ LABRAL REPAIR Right    SHOULDER ARTHROSCOPY WITH ROTATOR CUFF REPAIR Left 2003   SHOULDER ARTHROSCOPY WITH SUBACROMIAL DECOMPRESSION Left    Family History  Problem Relation Age of Onset   Allergic rhinitis Mother    Lung cancer Mother    Asthma Sister    Cancer Sister        Skin   Colon cancer Neg Hx    Colon polyps Neg Hx    Esophageal cancer Neg Hx    Stomach cancer Neg Hx    Rectal cancer Neg Hx    Outpatient Medications Prior to Visit  Medication Sig Dispense Refill   ACETAMINOPHEN EXTRA STRENGTH 500 MG tablet Take 1,000 mg by mouth every 8 (eight) hours as needed.     albuterol (VENTOLIN HFA) 108 (90 Base) MCG/ACT inhaler Inhale 2 puffs into the lungs every 6 (six) hours as needed for wheezing or shortness of breath. 8 g 2   fluticasone (FLONASE) 50 MCG/ACT nasal spray Place 2 sprays into both nostrils daily. 16 g 5   glucose blood test strip Please check blood sugar once a day due to fluctuating blood sugars.     JARDIANCE 25 MG TABS tablet TAKE  1 TABLET(25 MG) BY MOUTH DAILY 90 tablet 1   levothyroxine (SYNTHROID) 175 MCG tablet Take 1 tablet (175 mcg total) by mouth daily before breakfast. 90 tablet 2   meloxicam (MOBIC) 15 MG tablet Take 15 mg by mouth daily as needed.     metFORMIN (GLUCOPHAGE) 500 MG tablet TAKE 2 TABLETS BY MOUTH TWICE DAILY WITH MEALS 360 tablet 3   montelukast (SINGULAIR) 10 MG tablet TAKE 1 TABLET BY MOUTH EVERYDAY AT BEDTIME (Patient taking differently: daily as needed.) 30 tablet 5   Multiple Vitamin (MULTIVITAMIN WITH MINERALS) TABS tablet Take 1 tablet by mouth daily.     rosuvastatin (CRESTOR) 20 MG tablet TAKE 1 TABLET(20 MG) BY MOUTH DAILY 90 tablet 1   SKYRIZI PEN 150 MG/ML SOAJ      testosterone cypionate (DEPOTESTOSTERONE  CYPIONATE) 200 MG/ML injection PLEASE SEE ATTACHED FOR DETAILED DIRECTIONS     No facility-administered medications prior to visit.   No Known Allergies    Objective:   Today's Vitals   06/03/21 1523  BP: 132/66  Pulse: 100  Temp: 99.5 F (37.5 C)  TempSrc: Temporal  SpO2: 97%  Weight: 201 lb 6.4 oz (91.4 kg)  Height: '5\' 9"'$  (1.753 m)   Body mass index is 29.74 kg/m.   General: Well developed, well nourished. No acute distress. Skin: Warm and dry. There is an apparent bite wound on the right inner thigh with mild induration at   the site. There is no evidence of a rash surrounding this area. Neuro: CN II-XII intact. Normal sensation and DTR bilaterally. Psych: Alert and oriented. Normal mood and affect.  Health Maintenance Due  Topic Date Due   HIV Screening  Never done   Zoster Vaccines- Shingrix (1 of 2) Never done   URINE MICROALBUMIN  02/04/2021   OPHTHALMOLOGY EXAM  04/12/2021   HEMOGLOBIN A1C  04/23/2021     Assessment & Plan:   1. Tick bite of right thigh, initial encounter 2. Pain in joint, multiple sites Mr. Priebe notes "fever", arthralgias, and fatigue int he past several days. He did removed a small "deer tick" yesterday. He does not have evidence of erythema migrans. I cannot exclude that his current symptoms could represent early localized findings of Lyme disease. I will check acute serology (esp. in light of his past history of positive serology). I will go ahead and treat with doxycycline, though I am not absolutely convinced that this is necessary. If his symptoms are not improving, I strongly advise Mr. Zelman to follow up for further evaluation.  - doxycycline (VIBRA-TABS) 100 MG tablet; Take 1 tablet (100 mg total) by mouth 2 (two) times daily.  Dispense: 20 tablet; Refill: 0 - Lyme Disease Serology w/Reflex   Return if symptoms worsen or fail to improve.   Haydee Salter, MD

## 2021-06-04 LAB — URINALYSIS, ROUTINE W REFLEX MICROSCOPIC
Bilirubin Urine: NEGATIVE
Hgb urine dipstick: NEGATIVE
Ketones, ur: 15 — AB
Leukocytes,Ua: NEGATIVE
Nitrite: NEGATIVE
Specific Gravity, Urine: 1.015 (ref 1.000–1.030)
Urine Glucose: >=1000 — AB
Urobilinogen, UA: 0.2 (ref 0.0–1.0)
pH: 7 (ref 5.0–8.0)

## 2021-06-04 LAB — LIPID PANEL
Cholesterol: 126 mg/dL (ref 0–200)
HDL: 33.4 mg/dL — ABNORMAL LOW (ref >39.00)
LDL Cholesterol: 72 mg/dL (ref 0–99)
NonHDL: 92.52
Total CHOL/HDL Ratio: 4
Triglycerides: 105 mg/dL (ref 0.0–149.0)
VLDL: 21 mg/dL (ref 0.0–40.0)

## 2021-06-04 LAB — BASIC METABOLIC PANEL
BUN: 17 mg/dL (ref 6–23)
CO2: 25 mEq/L (ref 19–32)
Calcium: 9.7 mg/dL (ref 8.4–10.5)
Chloride: 96 mEq/L (ref 96–112)
Creatinine, Ser: 1 mg/dL (ref 0.40–1.50)
GFR: 80.9 mL/min (ref >60.00)
Glucose, Bld: 113 mg/dL — ABNORMAL HIGH (ref 70–99)
Potassium: 4.1 mEq/L (ref 3.5–5.1)
Sodium: 132 mEq/L — ABNORMAL LOW (ref 135–145)

## 2021-06-04 LAB — HEMOGLOBIN A1C: Hgb A1c MFr Bld: 9.2 % — ABNORMAL HIGH (ref 4.6–6.5)

## 2021-06-04 LAB — LYME DISEASE SEROLOGY W/REFLEX: Lyme Total Antibody EIA: NEGATIVE

## 2021-06-04 LAB — MICROALBUMIN / CREATININE URINE RATIO
Creatinine,U: 89.9 mg/dL
Microalb Creat Ratio: 8.1 mg/g (ref 0.0–30.0)
Microalb, Ur: 7.3 mg/dL — ABNORMAL HIGH (ref 0.0–1.9)

## 2021-06-04 MED ORDER — GLIPIZIDE 5 MG PO TABS: 2.5000 mg | ORAL_TABLET | Freq: Every day | ORAL | 3 refills | Status: DC

## 2021-06-04 NOTE — Addendum Note (Signed)
Addended by: Haydee Salter on: 06/04/2021 05:22 PM   Modules accepted: Orders

## 2021-06-24 ENCOUNTER — Ambulatory Visit (INDEPENDENT_AMBULATORY_CARE_PROVIDER_SITE_OTHER): Payer: BC Managed Care – PPO

## 2021-06-24 DIAGNOSIS — J309 Allergic rhinitis, unspecified: Secondary | ICD-10-CM

## 2021-11-07 ENCOUNTER — Other Ambulatory Visit: Payer: Self-pay | Admitting: Family Medicine

## 2021-11-07 DIAGNOSIS — E039 Hypothyroidism, unspecified: Secondary | ICD-10-CM

## 2021-12-10 ENCOUNTER — Other Ambulatory Visit: Payer: Self-pay | Admitting: Family Medicine

## 2021-12-10 DIAGNOSIS — J3089 Other allergic rhinitis: Secondary | ICD-10-CM

## 2021-12-10 DIAGNOSIS — E119 Type 2 diabetes mellitus without complications: Secondary | ICD-10-CM

## 2021-12-10 DIAGNOSIS — E785 Hyperlipidemia, unspecified: Secondary | ICD-10-CM

## 2021-12-11 LAB — HM DIABETES EYE EXAM

## 2021-12-18 ENCOUNTER — Ambulatory Visit (INDEPENDENT_AMBULATORY_CARE_PROVIDER_SITE_OTHER): Payer: BC Managed Care – PPO | Admitting: Family Medicine

## 2021-12-18 ENCOUNTER — Encounter: Payer: Self-pay | Admitting: Family Medicine

## 2021-12-18 VITALS — BP 118/74 | HR 85 | Temp 97.7°F | Ht 69.0 in | Wt 199.2 lb

## 2021-12-18 DIAGNOSIS — L409 Psoriasis, unspecified: Secondary | ICD-10-CM | POA: Diagnosis not present

## 2021-12-18 DIAGNOSIS — E119 Type 2 diabetes mellitus without complications: Secondary | ICD-10-CM | POA: Diagnosis not present

## 2021-12-18 DIAGNOSIS — E785 Hyperlipidemia, unspecified: Secondary | ICD-10-CM

## 2021-12-18 DIAGNOSIS — E039 Hypothyroidism, unspecified: Secondary | ICD-10-CM

## 2021-12-18 LAB — GLUCOSE, RANDOM: Glucose, Bld: 106 mg/dL — ABNORMAL HIGH (ref 70–99)

## 2021-12-18 LAB — TSH: TSH: 1.34 u[IU]/mL (ref 0.35–5.50)

## 2021-12-18 LAB — HEMOGLOBIN A1C: Hgb A1c MFr Bld: 8.5 % — ABNORMAL HIGH (ref 4.6–6.5)

## 2021-12-18 MED ORDER — GLIPIZIDE 5 MG PO TABS: 5.0000 mg | ORAL_TABLET | Freq: Every day | ORAL | 3 refills | Status: DC

## 2021-12-18 MED ORDER — EMPAGLIFLOZIN 25 MG PO TABS: 25.0000 mg | ORAL_TABLET | Freq: Every day | ORAL | 3 refills | Status: DC

## 2021-12-18 MED ORDER — LEVOTHYROXINE SODIUM 175 MCG PO TABS: 175.0000 ug | ORAL_TABLET | Freq: Every day | ORAL | 3 refills | Status: DC

## 2021-12-18 MED ORDER — ROSUVASTATIN CALCIUM 20 MG PO TABS: 20.0000 mg | ORAL_TABLET | Freq: Every day | ORAL | 3 refills | Status: DC

## 2021-12-18 MED ORDER — GLUCOSE BLOOD VI STRP: ORAL_STRIP | 3 refills | Status: AC

## 2021-12-18 NOTE — Progress Notes (Addendum)
[facility] [address] [place] [address] Dept: [phone] Dept Fax: [phone]  Chronic Care Office Visit  Subjective:    Patient ID: Shaun Velasquez, male    DOB: 1960/05/08, 61 y.o..   MRN: [id number]  Chief Complaint  Patient presents with   Diabetes    History of Present Illness:  Patient is in today for reassessment of chronic medical issues.  Shaun Velasquez has a history of Type 2 diabetes. He is managed on metformin 1,000 mg bid and empagliflozin (Jardiance) 25 mg daily. After his last visit, we added glipizide 2.5 mg daily, as his A1c had gone up.   Shaun Velasquez has a history of  hyperlipidemia. He is managed on rosuvastatin 20 mg daily.   Shaun Velasquez has a history of hypothyroidism, managed on levothyroxine 175 mcg daily.  Shaun Velasquez has a history of psoriasis. He is managed on [employer]. He has had a very good response. He notes his dermatologist has asked that he have TB testing performed.  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   Past Surgical History:  Procedure Laterality Date   CHEST TUBE INSERTION     pt had collapsed lung   COLONOSCOPY  2011   on [address], not sure name of the practice(normal)   COLONOSCOPY W/ POLYPECTOMY     ELBOW SURGERY Right 2009   INCISION AND DRAINAGE PERIRECTAL ABSCESS     REVERSE SHOULDER ARTHROPLASTY Right 12/27/2014   Procedure: RIGHT REVERSE SHOULDER ARTHROPLASTY;  Surgeon: Justice Britain, MD;  Location: [facility];  Service:  Orthopedics;  Laterality: Right;   ROTATOR CUFF REPAIR Right 2011   SHOULDER ARTHROSCOPY W/ LABRAL REPAIR Right    SHOULDER ARTHROSCOPY WITH ROTATOR CUFF REPAIR Left 2003   SHOULDER ARTHROSCOPY WITH SUBACROMIAL DECOMPRESSION Left    Family History  Problem Relation Age of Onset   Allergic rhinitis Mother    Lung cancer Mother    Asthma Sister    Cancer Sister        Skin   Colon cancer Neg Hx    Colon polyps Neg Hx    Esophageal cancer Neg Hx    Stomach cancer Neg Hx    Rectal cancer Neg Hx    Outpatient Medications Prior to Visit  Medication Sig Dispense Refill   ACETAMINOPHEN EXTRA STRENGTH 500 MG tablet Take 1,000 mg by mouth every 8 (eight) hours as needed.     albuterol (VENTOLIN HFA) 108 (90 Base) MCG/ACT inhaler Inhale 2 puffs into the lungs every 6 (six) hours as needed for wheezing or shortness of breath. 8 g 2   fluticasone (FLONASE) 50 MCG/ACT nasal spray SHAKE LIQUID AND USE 2 SPRAYS IN EACH NOSTRIL DAILY 16 g 5   glipiZIDE (GLUCOTROL) 5 MG tablet Take 0.5 tablets (2.5 mg total) by mouth daily before breakfast. 60 tablet 3   metFORMIN (GLUCOPHAGE) 500 MG tablet TAKE 2 TABLETS BY MOUTH TWICE DAILY WITH MEALS 360 tablet 3   montelukast (SINGULAIR) 10 MG tablet TAKE 1 TABLET BY MOUTH EVERYDAY AT BEDTIME (Patient taking differently: daily as needed.)  30 tablet 5   Multiple Vitamin (MULTIVITAMIN WITH MINERALS) TABS tablet Take 1 tablet by mouth daily.     SKYRIZI PEN 150 MG/ML SOAJ      testosterone cypionate (DEPOTESTOSTERONE CYPIONATE) 200 MG/ML injection PLEASE SEE ATTACHED FOR DETAILED DIRECTIONS     glucose blood test strip Please check blood sugar once a day due to fluctuating blood sugars.     JARDIANCE 25 MG TABS tablet TAKE 1 TABLET(25 MG) BY MOUTH DAILY 30 tablet 0   levothyroxine (SYNTHROID) 175 MCG tablet TAKE 1 TABLET BY MOUTH EVERY DAY BEFORE BREAKFAST 90 tablet 1   rosuvastatin (CRESTOR) 20 MG tablet TAKE 1 TABLET(20 MG) BY MOUTH DAILY 30 tablet 0   doxycycline  (VIBRA-TABS) 100 MG tablet Take 1 tablet (100 mg total) by mouth 2 (two) times daily. 20 tablet 0   meloxicam (MOBIC) 15 MG tablet Take 15 mg by mouth daily as needed.     No facility-administered medications prior to visit.   No Known Allergies    Objective:   Today's Vitals   12/18/21 0801  BP: 118/74  Pulse: 85  Temp: 97.7 F (36.5 C)  TempSrc: Temporal  SpO2: 97%  Weight: 199 lb 3.2 oz (90.4 kg)  Height: '5\' 9"'$  (1.753 m)   Body mass index is 29.42 kg/m.   General: Well developed, well nourished. No acute distress. Feet- Skin intact. No sign of maceration between toes. Nails are normal. Dorsalis pedis and   posterior tibial artery pulses are normal. 5.07 monofilament testing normal, except for   insensate over the right ball of the foot. Psych: Alert and oriented. Normal mood and affect.  Health Maintenance Due  Topic Date Due   HIV Screening  Never done   Zoster Vaccines- Shingrix (1 of 2) Never done   OPHTHALMOLOGY EXAM  04/12/2021   HEMOGLOBIN A1C  12/04/2021   Lab Results Last lipids Lab Results  Component Value Date   CHOL 126 06/03/2021   HDL 33.40 (L) 06/03/2021   LDLCALC 72 06/03/2021   LDLDIRECT 218.0 07/02/2017   TRIG 105.0 06/03/2021   CHOLHDL 4 06/03/2021   Last hemoglobin A1c Lab Results  Component Value Date   HGBA1C 9.2 (H) 06/03/2021   Last thyroid functions Lab Results  Component Value Date   TSH 0.37 10/23/2020     Assessment & Plan:   1. Type 2 diabetes mellitus without complication, without long-term current use of insulin (HCC) I will reassess the A1c today. We discussed Shaun Velasquez going back to testing some sugars at home, esp. as he does not like to come in as often as I might want him too. If his A1c is up today, we would plan to increase the glipizde dose.  - Glucose, random - Hemoglobin A1c - glucose blood test strip; Test fasting blood sugars once a day  Dispense: 100 each; Refill: 3 - empagliflozin (JARDIANCE) 25 MG TABS  tablet; Take 1 tablet (25 mg total) by mouth daily.  Dispense: 90 tablet; Refill: 3  2. Hypothyroidism, unspecified type Due for TSH monitoring.  - TSH - levothyroxine (SYNTHROID) 175 MCG tablet; Take 1 tablet (175 mcg total) by mouth daily before breakfast.  Dispense: 90 tablet; Refill: 3  3. Psoriasis As requested, will screen for TB in light of his biologic therapy.  - QuantiFERON-TB Gold Plus  4. Hyperlipidemia, unspecified hyperlipidemia type Continue rosuvastatin.  - rosuvastatin (CRESTOR) 20 MG tablet; Take 1 tablet (20 mg total) by mouth daily.  Dispense: 90 tablet; Refill: 3  Return in about 3 months (around 03/19/2022) for Reassessment.   Haydee Salter, MD

## 2021-12-18 NOTE — Addendum Note (Signed)
Addended by: Haydee Salter on: 12/18/2021 02:51 PM   Modules accepted: Orders

## 2021-12-20 LAB — QUANTIFERON-TB GOLD PLUS
Mitogen-NIL: 5.79 IU/mL
NIL: 0.04 IU/mL
QuantiFERON-TB Gold Plus: NEGATIVE
TB1-NIL: 0 IU/mL
TB2-NIL: <0 IU/mL

## 2021-12-29 ENCOUNTER — Encounter: Payer: Self-pay | Admitting: Family Medicine

## 2022-02-04 ENCOUNTER — Ambulatory Visit: Payer: BC Managed Care – PPO

## 2022-02-05 ENCOUNTER — Ambulatory Visit (INDEPENDENT_AMBULATORY_CARE_PROVIDER_SITE_OTHER): Payer: BC Managed Care – PPO

## 2022-02-05 DIAGNOSIS — Z23 Encounter for immunization: Secondary | ICD-10-CM

## 2022-02-05 NOTE — Progress Notes (Addendum)
After obtaining consent, and per orders of Dr. Gena Fray, injection of fluarix given IM in the left deltoid by Alinda Dooms [place]. Patient instructed to remain in clinic for 20 minutes afterwards, and to report any adverse reaction to me immediately.

## 2022-05-01 ENCOUNTER — Ambulatory Visit (INDEPENDENT_AMBULATORY_CARE_PROVIDER_SITE_OTHER): Payer: BC Managed Care – PPO | Admitting: Family Medicine

## 2022-05-01 ENCOUNTER — Encounter: Payer: Self-pay | Admitting: Family Medicine

## 2022-05-01 VITALS — BP 116/70 | HR 76 | Temp 98.4°F | Ht 69.0 in | Wt 204.4 lb

## 2022-05-01 DIAGNOSIS — L723 Sebaceous cyst: Secondary | ICD-10-CM | POA: Diagnosis not present

## 2022-05-01 DIAGNOSIS — E785 Hyperlipidemia, unspecified: Secondary | ICD-10-CM

## 2022-05-01 DIAGNOSIS — E039 Hypothyroidism, unspecified: Secondary | ICD-10-CM | POA: Diagnosis not present

## 2022-05-01 DIAGNOSIS — E119 Type 2 diabetes mellitus without complications: Secondary | ICD-10-CM | POA: Diagnosis not present

## 2022-05-01 DIAGNOSIS — L089 Local infection of the skin and subcutaneous tissue, unspecified: Secondary | ICD-10-CM

## 2022-05-01 LAB — HEMOGLOBIN A1C: Hgb A1c MFr Bld: 8.4 % — ABNORMAL HIGH (ref 4.6–6.5)

## 2022-05-01 LAB — TSH: TSH: 0.08 u[IU]/mL — ABNORMAL LOW (ref 0.35–5.50)

## 2022-05-01 LAB — GLUCOSE, RANDOM: Glucose, Bld: 122 mg/dL — ABNORMAL HIGH (ref 70–99)

## 2022-05-01 MED ORDER — LEVOTHYROXINE SODIUM 175 MCG PO TABS
175.0000 ug | ORAL_TABLET | Freq: Every day | ORAL | 3 refills | Status: DC
Start: 2022-05-01 — End: 2022-05-25

## 2022-05-01 MED ORDER — METFORMIN HCL 500 MG PO TABS: 1000.0000 mg | ORAL_TABLET | Freq: Two times a day (BID) | ORAL | 3 refills | Status: DC

## 2022-05-01 MED ORDER — PIOGLITAZONE HCL 15 MG PO TABS
15.0000 mg | ORAL_TABLET | Freq: Every day | ORAL | 3 refills | Status: DC
Start: 2022-05-01 — End: 2022-11-27

## 2022-05-01 NOTE — Assessment & Plan Note (Signed)
I will check his TSH level today. Continue levothyroxine 175 mcg daily

## 2022-05-01 NOTE — Assessment & Plan Note (Signed)
Stable.  Continue rosuvastatin 20 mg daily. ?

## 2022-05-01 NOTE — Addendum Note (Signed)
Addended by: Loyola Mast on: 05/01/2022 06:00 PM   Modules accepted: Orders

## 2022-05-01 NOTE — Assessment & Plan Note (Signed)
We will check an A1c today. Continue metformin 1,000 mg bid and empagliflozin 25 (Jardiance) 25 mg daily.

## 2022-05-01 NOTE — Progress Notes (Signed)
[facility] [address] [place] [address] Dept: [phone] Dept Fax: [phone]  Chronic Care Office Visit  Subjective:    Patient ID: Shaun Velasquez, male    DOB: May 11, 1960, 62 y.o..   MRN: [id number]  Chief Complaint  Patient presents with   Medical Management of Chronic Issues    6 month f/u .  C/o having a cyst under RT arm (seen dermatologist).     History of Present Illness:  Patient is in today for reassessment of chronic medical issues.  Shaun Velasquez has a history of Type 2 diabetes. He is managed on metformin 1,000 mg bid and empagliflozin 25 (Jardiance) 25 mg daily. He notes his average home blood sugars are 135-145.   Shaun Velasquez has a history of  hyperlipidemia. He is managed on rosuvastatin 20 mg daily.   Shaun Velasquez has a history of hypothyroidism, managed on levothyroxine 175 mcg daily.  Shaun Velasquez notes that he is currently being treated for an infected cyst in his right axilla. He has seen both dermatology and a surgeon regarding this. He did have this incised, but notes there was only blood and no pus. He was initially treated with Septra. He is now on doxycycline.  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   Past Surgical History:  Procedure Laterality Date   CHEST TUBE INSERTION     pt had collapsed lung   COLONOSCOPY  2011   on [address], not sure name of the practice(normal)   COLONOSCOPY W/ POLYPECTOMY     ELBOW SURGERY Right 2009   INCISION AND DRAINAGE PERIRECTAL ABSCESS     REVERSE SHOULDER  ARTHROPLASTY Right 12/27/2014   Procedure: RIGHT REVERSE SHOULDER ARTHROPLASTY;  Surgeon: Francena Hanly, MD;  Location: [facility];  Service: Orthopedics;  Laterality: Right;   ROTATOR CUFF REPAIR Right 2011   SHOULDER ARTHROSCOPY W/ LABRAL REPAIR Right    SHOULDER ARTHROSCOPY WITH ROTATOR CUFF REPAIR Left 2003   SHOULDER ARTHROSCOPY WITH SUBACROMIAL DECOMPRESSION Left    Family History  Problem Relation Age of Onset   Allergic rhinitis Mother    Lung cancer Mother    Asthma Sister    Cancer Sister        Skin   Colon cancer Neg Hx    Colon polyps Neg Hx    Esophageal cancer Neg Hx    Stomach cancer Neg Hx    Rectal cancer Neg Hx    Outpatient Medications Prior to Visit  Medication Sig Dispense Refill   albuterol (VENTOLIN HFA) 108 (90 Base) MCG/ACT inhaler Inhale 2 puffs into the lungs every 6 (six) hours as needed for wheezing or shortness of breath. 8 g 2   doxycycline (VIBRAMYCIN) 100 MG capsule Take 100 mg by mouth 2 (two) times daily.     empagliflozin (JARDIANCE) 25 MG TABS tablet Take 1 tablet (25 mg total) by mouth daily. 90 tablet 3   fluticasone (FLONASE) 50 MCG/ACT nasal spray SHAKE LIQUID AND USE 2 SPRAYS IN EACH NOSTRIL DAILY 16 g 5   glipiZIDE (GLUCOTROL) 5 MG tablet Take 1 tablet (5 mg total)  by mouth daily before breakfast. 90 tablet 3   glucose blood test strip Test fasting blood sugars once a day 100 each 3   montelukast (SINGULAIR) 10 MG tablet TAKE 1 TABLET BY MOUTH EVERYDAY AT BEDTIME (Patient taking differently: daily as needed.) 30 tablet 5   Multiple Vitamin (MULTIVITAMIN WITH MINERALS) TABS tablet Take 1 tablet by mouth daily.     rosuvastatin (CRESTOR) 20 MG tablet Take 1 tablet (20 mg total) by mouth daily. 90 tablet 3   SKYRIZI PEN 150 MG/ML SOAJ      testosterone cypionate (DEPOTESTOSTERONE CYPIONATE) 200 MG/ML injection PLEASE SEE ATTACHED FOR DETAILED DIRECTIONS     levothyroxine (SYNTHROID) 175 MCG tablet Take 1 tablet (175 mcg total) by mouth daily before  breakfast. 90 tablet 3   metFORMIN (GLUCOPHAGE) 500 MG tablet TAKE 2 TABLETS BY MOUTH TWICE DAILY WITH MEALS 360 tablet 3   ACETAMINOPHEN EXTRA STRENGTH 500 MG tablet Take 1,000 mg by mouth every 8 (eight) hours as needed.     No facility-administered medications prior to visit.   No Known Allergies Objective:   Today's Vitals   05/01/22 0755  BP: 116/70  Pulse: 76  Temp: 98.4 F (36.9 C)  TempSrc: Temporal  SpO2: 98%  Weight: 204 lb 6.4 oz (92.7 kg)  Height:  (1.753 m)   Body mass index is 30.18 kg/m.   General: Well developed, well nourished. No acute distress. Skin: Warm and dry. There is a 4-5 cm area of induration in the right axilla. This has shown some   sign of superficial peeling. No fluctuance noted. Neuro: CN II-XII intact. Normal sensation and DTR bilaterally. Psych: Alert and oriented. Normal mood and affect.  Health Maintenance Due  Topic Date Due   HIV Screening  Never done     Assessment & Plan:   Problem List Items Addressed This Visit       Endocrine   Hypothyroidism    I will check his TSH level today. Continue levothyroxine 175 mcg daily      Relevant Medications   levothyroxine (SYNTHROID) 175 MCG tablet   Other Relevant Orders   TSH   Type 2 diabetes mellitus without complication, without long-term current use of insulin - Primary    We will check an A1c today. Continue metformin 1,000 mg bid and empagliflozin 25 (Jardiance) 25 mg daily.      Relevant Medications   metFORMIN (GLUCOPHAGE) 500 MG tablet   Other Relevant Orders   Glucose, random   Hemoglobin A1c     Other   Hyperlipidemia    Stable. Continue rosuvastatin 20 mg daily.      Other Visit Diagnoses     Infected sebaceous cyst of skin- Right axilla       Recommend he do hot packs for 10 min. twice a day. Continue doxycycline. Plan to removal of cyst with surgeon once infection resolved.       Return in about 6 months (around 10/31/2022) for Reassessment.    Loyola Mast, MD

## 2022-05-25 ENCOUNTER — Other Ambulatory Visit: Payer: Self-pay | Admitting: Family Medicine

## 2022-05-25 DIAGNOSIS — E039 Hypothyroidism, unspecified: Secondary | ICD-10-CM

## 2022-05-25 NOTE — Telephone Encounter (Signed)
Last TSH low. Needs repeat in 3 months.

## 2022-06-27 ENCOUNTER — Other Ambulatory Visit: Payer: Self-pay | Admitting: Family Medicine

## 2022-06-27 DIAGNOSIS — J3089 Other allergic rhinitis: Secondary | ICD-10-CM

## 2022-07-03 ENCOUNTER — Other Ambulatory Visit: Payer: Self-pay

## 2022-07-03 DIAGNOSIS — E119 Type 2 diabetes mellitus without complications: Secondary | ICD-10-CM

## 2022-07-03 MED ORDER — METFORMIN HCL 500 MG PO TABS
1000.0000 mg | ORAL_TABLET | Freq: Two times a day (BID) | ORAL | 3 refills | Status: DC
Start: 2022-07-03 — End: 2023-05-17

## 2022-07-03 NOTE — Telephone Encounter (Signed)
Requesting: Metformin 500mg  Last Visit: 05/01/2022 Next Visit: Visit date not found Last Refill: 05/01/2022  Please Advise

## 2022-07-17 ENCOUNTER — Encounter: Payer: Self-pay | Admitting: Family Medicine

## 2022-07-17 ENCOUNTER — Ambulatory Visit (INDEPENDENT_AMBULATORY_CARE_PROVIDER_SITE_OTHER): Payer: BC Managed Care – PPO | Admitting: Family Medicine

## 2022-07-17 VITALS — BP 118/66 | HR 100 | Temp 97.8°F | Ht 69.0 in | Wt 202.8 lb

## 2022-07-17 DIAGNOSIS — L089 Local infection of the skin and subcutaneous tissue, unspecified: Secondary | ICD-10-CM

## 2022-07-17 DIAGNOSIS — L723 Sebaceous cyst: Secondary | ICD-10-CM | POA: Diagnosis not present

## 2022-07-17 MED ORDER — DOXYCYCLINE HYCLATE 100 MG PO TABS
100.0000 mg | ORAL_TABLET | Freq: Two times a day (BID) | ORAL | 0 refills | Status: DC
Start: 2022-07-17 — End: 2022-08-12

## 2022-07-17 NOTE — Progress Notes (Signed)
[facility] [address] [place] [address] Dept: [phone] Dept Fax: [phone]  Office Visit  Subjective:    Patient ID: Shaun Velasquez, male    DOB: 17-Jun-1960, 62 y.o..   MRN: [id number]  Chief Complaint  Patient presents with   Cyst    C/o having a cyst under RT arm.    History of Present Illness:  Patient is in today for evaluation of a recurrent infection in a cyst under his right arm. Mr. Langell had last had issue with this in mid-April. He had this incised and drained at some time in the past, as well. He notes that this did clear up from the last time, but had not followed up with surgery regarding excision. In the past few days, this has been back to being swollen and red.  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   Past Surgical History:  Procedure Laterality Date   CHEST TUBE INSERTION     pt had collapsed lung   COLONOSCOPY  2011   on [address], not sure name of the practice(normal)   COLONOSCOPY W/ POLYPECTOMY     ELBOW SURGERY Right 2009   INCISION AND DRAINAGE PERIRECTAL ABSCESS     REVERSE SHOULDER ARTHROPLASTY Right 12/27/2014   Procedure: RIGHT REVERSE SHOULDER ARTHROPLASTY;  Surgeon: Francena Hanly, MD;  Location: [facility];  Service: Orthopedics;  Laterality: Right;   ROTATOR CUFF REPAIR Right 2011   SHOULDER ARTHROSCOPY W/ LABRAL REPAIR Right    SHOULDER ARTHROSCOPY WITH ROTATOR CUFF REPAIR Left 2003   SHOULDER ARTHROSCOPY WITH SUBACROMIAL DECOMPRESSION Left    Family History  Problem  Relation Age of Onset   Allergic rhinitis Mother    Lung cancer Mother    Asthma Sister    Cancer Sister        Skin   Colon cancer Neg Hx    Colon polyps Neg Hx    Esophageal cancer Neg Hx    Stomach cancer Neg Hx    Rectal cancer Neg Hx    Outpatient Medications Prior to Visit  Medication Sig Dispense Refill   albuterol (VENTOLIN HFA) 108 (90 Base) MCG/ACT inhaler Inhale 2 puffs into the lungs every 6 (six) hours as needed for wheezing or shortness of breath. 8 g 2   empagliflozin (JARDIANCE) 25 MG TABS tablet Take 1 tablet (25 mg total) by mouth daily. 90 tablet 3   fluticasone (FLONASE) 50 MCG/ACT nasal spray SHAKE LIQUID AND USE 2 SPRAYS IN EACH NOSTRIL DAILY 16 g 5   glipiZIDE (GLUCOTROL) 5 MG tablet Take 1 tablet (5 mg total) by mouth daily before breakfast. 90 tablet 3   glucose blood test strip Test fasting blood sugars once a day 100 each 3   levothyroxine (SYNTHROID) 175 MCG tablet TAKE 1 TABLET BY MOUTH EVERY DAY BEFORE BREAKFAST 90 tablet 0   metFORMIN (GLUCOPHAGE) 500 MG tablet Take 2 tablets (1,000 mg total) by mouth 2 (two) times daily with a meal. 360 tablet 3   montelukast (SINGULAIR) 10 MG tablet TAKE 1 TABLET BY MOUTH EVERYDAY AT BEDTIME (Patient taking differently:  daily as needed.) 30 tablet 5   Multiple Vitamin (MULTIVITAMIN WITH MINERALS) TABS tablet Take 1 tablet by mouth daily.     pioglitazone (ACTOS) 15 MG tablet Take 1 tablet (15 mg total) by mouth daily. 90 tablet 3   rosuvastatin (CRESTOR) 20 MG tablet Take 1 tablet (20 mg total) by mouth daily. 90 tablet 3   SKYRIZI PEN 150 MG/ML SOAJ      tamsulosin (FLOMAX) 0.4 MG CAPS capsule Take 0.4 mg by mouth daily.     testosterone cypionate (DEPOTESTOSTERONE CYPIONATE) 200 MG/ML injection PLEASE SEE ATTACHED FOR DETAILED DIRECTIONS     doxycycline (VIBRAMYCIN) 100 MG capsule Take 100 mg by mouth 2 (two) times daily.     No facility-administered medications prior to visit.   No Known Allergies   Objective:    Today's Vitals   07/17/22 1552  BP: 118/66  Pulse: 100  Temp: 97.8 F (36.6 C)  TempSrc: Temporal  SpO2: 98%  Weight: 202 lb 12.8 oz (92 kg)  Height: 5\' 9"  (1.753 m)   Body mass index is 29.95 kg/m.   General: Well developed, well nourished. No acute distress. Skin: There is a 4 x 8 cm area fo redness int he right axilla. This is blancheable. There is a   raised firm area centrally located near a well-healed scar. Psych: Alert and oriented. Normal mood and affect.  Health Maintenance Due  Topic Date Due   HIV Screening  Never done   Diabetic kidney evaluation - eGFR measurement  06/04/2022   Diabetic kidney evaluation - Urine ACR  06/04/2022   DTaP/Tdap/Td (2 - Td or Tdap) 06/29/2022     Assessment & Plan:   Problem List Items Addressed This Visit   None Visit Diagnoses     Infected sebaceous cyst of skin- Right axilla    -  Primary   I will treat with doxycycline and have him do hot packs several times a day. I will refer him to surgery for consideration of excisin of the cyst.   Relevant Medications   doxycycline (VIBRA-TABS) 100 MG tablet   Other Relevant Orders   Ambulatory referral to General Surgery       No follow-ups on file.   Loyola Mast, MD

## 2022-07-27 ENCOUNTER — Encounter: Payer: Self-pay | Admitting: Family Medicine

## 2022-07-27 DIAGNOSIS — L089 Local infection of the skin and subcutaneous tissue, unspecified: Secondary | ICD-10-CM

## 2022-07-28 MED ORDER — CLINDAMYCIN HCL 300 MG PO CAPS
300.0000 mg | ORAL_CAPSULE | Freq: Three times a day (TID) | ORAL | 0 refills | Status: DC
Start: 2022-07-28 — End: 2022-09-11

## 2022-08-12 ENCOUNTER — Ambulatory Visit: Payer: Self-pay | Admitting: Surgery

## 2022-08-12 MED ORDER — DOXYCYCLINE HYCLATE 100 MG PO TABS
100.0000 mg | ORAL_TABLET | Freq: Two times a day (BID) | ORAL | 0 refills | Status: DC
Start: 2022-08-12 — End: 2022-09-29

## 2022-08-12 NOTE — Addendum Note (Signed)
Addended by: Loyola Mast on: 08/12/2022 01:15 PM   Modules accepted: Orders

## 2022-08-12 NOTE — H&P (Signed)
Shaun Velasquez [id number]   Referring Provider:  [name], MD   Subjective   Chief Complaint: New Consultation (evaulate seb. cyst of R axilla. 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  He has been dealing with this since January of this year and has been on numerous courses of antibiotics.  He had it lanced and reports that the output was bloody.  He states that his orthopedic surgeon has seen it and told him it was a cyst, did not have any concern that it had anything to do with his remote shoulder surgery.  Over the antibiotics and, the lesion becomes more inflamed and infected.    Review of Systems: A complete review of systems was obtained from the patient.  I have reviewed this information and discussed as appropriate with the patient.  See HPI as well for other ROS.   Medical History: Past Medical History:  Diagnosis Date   Arthritis    Diabetes mellitus without complication (CMS/HHS-HCC)    Hyperlipidemia    Thyroid disease     There is no problem list on file for this patient.   Past Surgical History:  Procedure Laterality Date   TOTAL SHOULDER REPLACEMENT     left 2022, right 2016     No Known Allergies  Current Outpatient Medications on File Prior to Visit  Medication Sig Dispense Refill   empagliflozin (JARDIANCE) 25 mg tablet Take 1 tablet by mouth once daily     levothyroxine (SYNTHROID) 175 MCG tablet Take 1 tablet by mouth every morning before breakfast (0630)     metFORMIN (GLUCOPHAGE) 500 MG tablet Take 1,000 mg by mouth 2 (two) times daily with meals     rosuvastatin (CRESTOR) 20 MG tablet Take 20 mg by mouth  once daily     SKYRIZI 150 mg/mL PnIj      No current facility-administered medications on file prior to visit.    Family History  Problem Relation Age of Onset   Lung cancer Mother    Skin cancer Sister      Social History   Tobacco Use  Smoking Status Former   Types: Cigarettes  Smokeless Tobacco Never     Social History   Socioeconomic History   Marital status: Married  Tobacco Use   Smoking status: Former    Types: Cigarettes   Smokeless tobacco: Never  Vaping Use   Vaping status: Never Used  Substance and Sexual Activity   Alcohol use: Not Currently   Drug use: Never    Objective:    Vitals:   08/12/22 1039  BP: 118/68  Pulse: 86  Temp: 36.7 C (98 F)  SpO2: 98%  Weight: 93.4 kg (205 lb 12.8 oz)  Height: 176.5 cm (5' 9.5")  PainSc:   2  PainLoc: Other (Comment)    Body mass index is 29.96 kg/m.  Gen: A&Ox3, no distress  Unlabored respirations On the posterior lateral surface of his right upper arm, just outside the axilla posteriorly is an approximately 2 cm circumference subcutaneous cyst with about 2 additional centimeters of erythema extending proximally and distally with induration. He showed me a photo of when it was very inflamed early on  and the picture itself looks like a staph phlegmon  Assessment and Plan:  Diagnoses and all orders for this visit:  Subcutaneous mass  He has been dealing with this for over 6 months without resolution despite that side I&D as well as multiple courses of antibiotics.  We discussed excision under MAC and depending on state of inflammation at the time of surgery may require an open wound.  Discussed risks of bleeding, recurrence, pain, scarring, etc.  Will plan to proceed at his soonest convenience.  Shaun Curtner Carlye Grippe, MD

## 2022-09-11 ENCOUNTER — Encounter: Payer: Self-pay | Admitting: Family Medicine

## 2022-09-11 ENCOUNTER — Ambulatory Visit: Payer: BC Managed Care – PPO | Admitting: Family Medicine

## 2022-09-11 VITALS — BP 120/70 | HR 75 | Temp 97.8°F | Ht 69.0 in | Wt 201.6 lb

## 2022-09-11 DIAGNOSIS — L089 Local infection of the skin and subcutaneous tissue, unspecified: Secondary | ICD-10-CM | POA: Diagnosis not present

## 2022-09-11 DIAGNOSIS — L723 Sebaceous cyst: Secondary | ICD-10-CM

## 2022-09-11 MED ORDER — CLINDAMYCIN HCL 300 MG PO CAPS
300.0000 mg | ORAL_CAPSULE | Freq: Three times a day (TID) | ORAL | 0 refills | Status: AC
Start: 2022-09-11 — End: 2022-09-21

## 2022-09-11 NOTE — Progress Notes (Signed)
Established Patient Office Visit   Subjective:  Patient ID: BESNIK CHARACTER, male    DOB: 1960/11/27  Age: 62 y.o. MRN: [id number]  Chief Complaint  Patient presents with   Cyst    Right armpit cyst. Pt states cyst is not getter better and has moved forward.     HPI Encounter Diagnoses  Name Primary?   Infected sebaceous cyst of skin Yes   For follow-up of a tender swelling in his right axilla.  He has a history of inclusion cyst in this area.  There was one that had been drained previously and never returned.  He believes this to be a new 1.  He just finished 10 days of doxycycline.  He believes the Cleocin had worked better for him in the past.   Review of Systems  Constitutional: Negative.   HENT: Negative.    Eyes:  Negative for blurred vision, discharge and redness.  Respiratory: Negative.    Cardiovascular: Negative.   Gastrointestinal:  Negative for abdominal pain.  Genitourinary: Negative.   Musculoskeletal: Negative.  Negative for myalgias.  Skin:  Negative for rash.  Neurological:  Negative for tingling, loss of consciousness and weakness.  Endo/Heme/Allergies:  Negative for polydipsia.     Current Outpatient Medications:    albuterol (VENTOLIN HFA) 108 (90 Base) MCG/ACT inhaler, Inhale 2 puffs into the lungs every 6 (six) hours as needed for wheezing or shortness of breath., Disp: 8 g, Rfl: 2   clindamycin (CLEOCIN) 300 MG capsule, Take 1 capsule (300 mg total) by mouth 3 (three) times daily for 10 days., Disp: 30 capsule, Rfl: 0   doxycycline (VIBRA-TABS) 100 MG tablet, Take 1 tablet (100 mg total) by mouth 2 (two) times daily., Disp: 20 tablet, Rfl: 0   empagliflozin (JARDIANCE) 25 MG TABS tablet, Take 1 tablet (25 mg total) by mouth daily., Disp: 90 tablet, Rfl: 3   fluticasone (FLONASE) 50 MCG/ACT nasal spray, SHAKE LIQUID AND USE 2 SPRAYS IN EACH NOSTRIL DAILY, Disp: 16 g, Rfl: 5   glipiZIDE (GLUCOTROL) 5 MG tablet, Take 1 tablet (5 mg total) by mouth daily  before breakfast., Disp: 90 tablet, Rfl: 3   glucose blood test strip, Test fasting blood sugars once a day, Disp: 100 each, Rfl: 3   levothyroxine (SYNTHROID) 175 MCG tablet, TAKE 1 TABLET BY MOUTH EVERY DAY BEFORE BREAKFAST, Disp: 90 tablet, Rfl: 0   metFORMIN (GLUCOPHAGE) 500 MG tablet, Take 2 tablets (1,000 mg total) by mouth 2 (two) times daily with a meal., Disp: 360 tablet, Rfl: 3   montelukast (SINGULAIR) 10 MG tablet, TAKE 1 TABLET BY MOUTH EVERYDAY AT BEDTIME (Patient taking differently: daily as needed.), Disp: 30 tablet, Rfl: 5   Multiple Vitamin (MULTIVITAMIN WITH MINERALS) TABS tablet, Take 1 tablet by mouth daily., Disp: , Rfl:    pioglitazone (ACTOS) 15 MG tablet, Take 1 tablet (15 mg total) by mouth daily., Disp: 90 tablet, Rfl: 3   rosuvastatin (CRESTOR) 20 MG tablet, Take 1 tablet (20 mg total) by mouth daily., Disp: 90 tablet, Rfl: 3   SKYRIZI PEN 150 MG/ML SOAJ, , Disp: , Rfl:    tamsulosin (FLOMAX) 0.4 MG CAPS capsule, Take 0.4 mg by mouth daily., Disp: , Rfl:    testosterone cypionate (DEPOTESTOSTERONE CYPIONATE) 200 MG/ML injection, PLEASE SEE ATTACHED FOR DETAILED DIRECTIONS, Disp: , Rfl:    Objective:     BP 120/70   Pulse 75   Temp 97.8 F (36.6 C)   Ht 5\' 9"  (1.753 m)  Wt 201 lb 9.6 oz (91.4 kg)   SpO2 95%   BMI 29.77 kg/m    Physical Exam Constitutional:      General: He is not in acute distress.    Appearance: Normal appearance. He is not ill-appearing, toxic-appearing or diaphoretic.  HENT:     Head: Normocephalic and atraumatic.     Right Ear: External ear normal.     Left Ear: External ear normal.  Eyes:     General: No scleral icterus.       Right eye: No discharge.        Left eye: No discharge.     Extraocular Movements: Extraocular movements intact.     Conjunctiva/sclera: Conjunctivae normal.  Pulmonary:     Effort: Pulmonary effort is normal. No respiratory distress.  Skin:    General: Skin is warm and dry.       Neurological:      Mental Status: He is alert and oriented to person, place, and time.  Psychiatric:        Mood and Affect: Mood normal.        Behavior: Behavior normal.    Incision and Drainage Procedure Note  Pre-operative Diagnosis: Infected inclusion cyst  Post-operative Diagnosis: normal  Indications: Discomfort swelling and infection  Anesthesia: 2% plain lidocaine  Procedure Details  The procedure, risks and complications have been discussed in detail (including, but not limited to airway compromise, infection, bleeding) with the patient, and the patient has signed consent to the procedure.  The skin was sterilely prepped and draped over the affected area in the usual fashion. After adequate local anesthesia, I&D with a #11 blade was performed on the right axilla with a T shaped incision. Minimal purulent drainage. The patient was observed until stable.  Findings: Infected inclusion cyst  EBL: 1 cc cc's  Drains: No drain  Condition: Tolerated procedure well   Complications: none.    No results found for any visits on 09/11/22.    The ASCVD Risk score (Arnett DK, et al., 2019) failed to calculate for the following reasons:   The valid total cholesterol range is 130 to 320 mg/dL    Assessment & Plan:   Infected sebaceous cyst of skin -     Clindamycin HCl; Take 1 capsule (300 mg total) by mouth 3 (three) times daily for 10 days.  Dispense: 30 capsule; Refill: 0    Return if symptoms worsen or fail to improve.  Please return next week if not improving.  Otherwise follow-up with general surgery as planned.  Mliss Sax, MD

## 2022-09-17 NOTE — Progress Notes (Signed)
COVID Vaccine received:  []  No [x]  Yes Date of any COVID positive Test in last 90 days:  PCP - [name] MD Cardiologist -   Chest x-ray -  EKG -   Stress Test -  ECHO -  Cardiac Cath -   Bowel Prep - []  No  []   Yes ______  Pacemaker / ICD device []  No []  Yes   Spinal Cord Stimulator:[]  No []  Yes       History of Sleep Apnea? []  No []  Yes   CPAP used?- []  No []  Yes    Does the patient monitor blood sugar?          []  No []  Yes  []  N/A  Patient has: []  NO Hx DM   []  Pre-DM                 []  DM1  []   DM2 Does patient have a [employer] or Dexacom? []  No []  Yes   Fasting Blood Sugar Ranges-  Checks Blood Sugar _____ times a day  GLP1 agonist / usual dose -  GLP1 instructions:  SGLT-2 inhibitors / usual dose -  SGLT-2 instructions:   Blood Thinner / Instructions: Aspirin Instructions:  Comments:   Activity level: Patient is able / unable to climb a flight of stairs without difficulty; []  No CP  []  No SOB, but would have ___   Patient can / can not perform ADLs without assistance.   Anesthesia review:   Patient denies shortness of breath, fever, cough and chest pain at PAT appointment.  Patient verbalized understanding and agreement to the Pre-Surgical Instructions that were given to them at this PAT appointment. Patient was also educated of the need to review these PAT instructions again prior to his/her surgery.I reviewed the appropriate phone numbers to call if they have any and questions or concerns.

## 2022-09-17 NOTE — Patient Instructions (Addendum)
SURGICAL WAITING ROOM VISITATION  Patients having surgery or a procedure may have no more than 2 support people in the waiting area - these visitors may rotate.    Children under the age of 44 must have an adult with them who is not the patient.  Due to an increase in RSV and influenza rates and associated hospitalizations, children ages 16 and under may not visit patients in [facility] hospitals.  If the patient needs to stay at the hospital during part of their recovery, the visitor guidelines for inpatient rooms apply. Pre-op nurse will coordinate an appropriate time for 1 support person to accompany patient in pre-op.  This support person may not rotate.    Please refer to the [facility] website for the visitor guidelines for Inpatients (after your surgery is over and you are in a regular room).       Your procedure is scheduled on: 11/27/22   Report to [facility] Main Entrance    Report to admitting at 7:15 AM   Call this number if you have problems the morning of surgery [phone]   Do not eat food  or drink liquids :After Midnight.      Oral Hygiene is also important to reduce your risk of infection.                                    Remember - BRUSH YOUR TEETH THE MORNING OF SURGERY WITH YOUR REGULAR TOOTHPASTE  DENTURES WILL BE REMOVED PRIOR TO SURGERY PLEASE DO NOT APPLY "Poly grip" OR ADHESIVES!!!   Stop all vitamins and herbal supplements 7 days before surgery.   Take these medicines the morning of surgery with A SIP OF WATER: Levothyroxine, Rosuvastatin, Tamsulosin  DO NOT TAKE ANY ORAL DIABETIC MEDICATIONS DAY OF YOUR SURGERY Hold Jardiance for 72 hours prior to surgery. Hold Glipizide the day of surgery Hold Metformin the day of surgery Hold Actos the day of surgery             You may not have any metal on your body including hair pins, jewelry, and body piercing             Do not wear make-up, lotions, powders, cologne, or deodorant               Men may shave face and neck.   Do not bring valuables to the hospital. [facility] IS NOT             RESPONSIBLE   FOR VALUABLES.   Contacts, glasses, dentures or bridgework may not be worn into surgery.  DO NOT BRING YOUR HOME MEDICATIONS TO THE HOSPITAL. PHARMACY WILL DISPENSE MEDICATIONS LISTED ON YOUR MEDICATION LIST TO YOU DURING YOUR ADMISSION IN THE HOSPITAL!    Patients discharged on the day of surgery will not be allowed to drive home.  Someone NEEDS to stay with you for the first 24 hours after anesthesia.   Special Instructions: Bring a copy of your healthcare power of attorney and living will documents the day of surgery if you haven't scanned them before.              Please read over the following fact sheets you were given: IF YOU HAVE QUESTIONS ABOUT YOUR PRE-OP INSTRUCTIONS PLEASE CALL [phone] [facility] [place]   If you received a COVID test during your pre-op visit  it is requested that you wear  a mask when out in public, stay away from anyone that may not be feeling well and notify your surgeon if you develop symptoms. If you test positive for Covid or have been in contact with anyone that has tested positive in the last 10 days please notify you surgeon.    [facility] - Preparing for Surgery Before surgery, you can play an important role.  Because skin is not sterile, your skin needs to be as free of germs as possible.  You can reduce the number of germs on your skin by washing with CHG (chlorahexidine gluconate) soap before surgery.  CHG is an antiseptic cleaner which kills germs and bonds with the skin to continue killing germs even after washing. Please DO NOT use if you have an allergy to CHG or antibacterial soaps.  If your skin becomes reddened/irritated stop using the CHG and inform your nurse when you arrive at Short Stay. Do not shave (including legs and underarms) for at least 48 hours prior to the first CHG shower.  You may shave your face/neck.  Please  follow these instructions carefully:  1.  Shower with CHG Soap the night before surgery and the  morning of surgery.  2.  If you choose to wash your hair, wash your hair first as usual with your normal  shampoo.  3.  After you shampoo, rinse your hair and body thoroughly to remove the shampoo.                             4.  Use CHG as you would any other liquid soap.  You can apply chg directly to the skin and wash.  Gently with a scrungie or clean washcloth.  5.  Apply the CHG Soap to your body ONLY FROM THE NECK DOWN.   Do   not use on face/ open                           Wound or open sores. Avoid contact with eyes, ears mouth and   genitals (private parts).                       Wash face,  Genitals (private parts) with your normal soap.             6.  Wash thoroughly, paying special attention to the area where your    surgery  will be performed.  7.  Thoroughly rinse your body with warm water from the neck down.  8.  DO NOT shower/wash with your normal soap after using and rinsing off the CHG Soap.                9.  Pat yourself dry with a clean towel.            10.  Wear clean pajamas.            11.  Place clean sheets on your bed the night of your first shower and do not  sleep with pets. Day of Surgery : Do not apply any lotions/deodorants the morning of surgery.  Please wear clean clothes to the hospital/surgery center.  FAILURE TO FOLLOW THESE INSTRUCTIONS MAY RESULT IN THE CANCELLATION OF YOUR SURGERY  PATIENT SIGNATURE_________________________________  NURSE SIGNATURE__________________________________  ________________________________________________________________________

## 2022-09-18 ENCOUNTER — Encounter (HOSPITAL_COMMUNITY)
Admission: RE | Admit: 2022-09-18 | Discharge: 2022-09-18 | Disposition: A | Discharge status: Home / Self Care | Payer: BC Managed Care – PPO | Source: Ambulatory Visit | Attending: Anesthesiology | Admitting: Anesthesiology

## 2022-09-18 DIAGNOSIS — E119 Type 2 diabetes mellitus without complications: Secondary | ICD-10-CM

## 2022-09-29 ENCOUNTER — Encounter: Payer: Self-pay | Admitting: Family Medicine

## 2022-09-29 ENCOUNTER — Ambulatory Visit (INDEPENDENT_AMBULATORY_CARE_PROVIDER_SITE_OTHER): Payer: BC Managed Care – PPO | Admitting: Family Medicine

## 2022-09-29 VITALS — BP 118/68 | HR 78 | Temp 98.3°F | Ht 69.0 in | Wt 207.4 lb

## 2022-09-29 DIAGNOSIS — Z23 Encounter for immunization: Secondary | ICD-10-CM

## 2022-09-29 DIAGNOSIS — L03111 Cellulitis of right axilla: Secondary | ICD-10-CM | POA: Diagnosis not present

## 2022-09-29 MED ORDER — SULFAMETHOXAZOLE-TRIMETHOPRIM 800-160 MG PO TABS
1.0000 | ORAL_TABLET | Freq: Two times a day (BID) | ORAL | 0 refills | Status: DC
Start: 2022-09-29 — End: 2022-11-27

## 2022-09-29 NOTE — Addendum Note (Signed)
Addended by: Waymond Cera on: 09/29/2022 03:47 PM   Modules accepted: Orders

## 2022-09-29 NOTE — Patient Instructions (Signed)
Hot pack the right armpit each morning and evening until infection resolves.

## 2022-09-29 NOTE — Progress Notes (Signed)
[facility] [address] [place] [address] Dept: [phone] Dept Fax: [phone]  Office Visit  Subjective:    Patient ID: Shaun Velasquez, male    DOB: [birth year], 62 y.o..   MRN: [id number]  Chief Complaint  Patient presents with   Cyst    C/o having pain in Rt armpit (cyst).  Warm to the touch, redness   History of Present Illness:  Patient is in today for assessment of his right axilla. Shaun Velasquez has a history of recurrent infection in a cyst under his right arm. Shaun Velasquez had this lanced by Dr. Doreene Burke on 8/30 and was treated with a course of clindamycin. He felt this did not clear the infection up as quickly as his previous meds. In the past few days, this has been back to being swollen and red. Surgery is planned for Nov.  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   Past Surgical History:  Procedure Laterality Date   CHEST TUBE INSERTION     pt had collapsed lung   COLONOSCOPY  2011   on [address], not sure name of the practice(normal)   COLONOSCOPY W/ POLYPECTOMY     ELBOW SURGERY Right 2009   INCISION AND DRAINAGE PERIRECTAL ABSCESS     REVERSE SHOULDER ARTHROPLASTY Right 12/27/2014   Procedure: RIGHT REVERSE SHOULDER ARTHROPLASTY;  Surgeon: Francena Hanly, MD;  Location: [facility] OR;  Service: Orthopedics;  Laterality: Right;   ROTATOR CUFF REPAIR Right 2011   SHOULDER ARTHROSCOPY W/ LABRAL REPAIR Right    SHOULDER ARTHROSCOPY WITH ROTATOR CUFF REPAIR Left 2003    SHOULDER ARTHROSCOPY WITH SUBACROMIAL DECOMPRESSION Left    Family History  Problem Relation Age of Onset   Allergic rhinitis Mother    Lung cancer Mother    Asthma Sister    Cancer Sister        Skin   Colon cancer Neg Hx    Colon polyps Neg Hx    Esophageal cancer Neg Hx    Stomach cancer Neg Hx    Rectal cancer Neg Hx    Outpatient Medications Prior to Visit  Medication Sig Dispense Refill   albuterol (VENTOLIN HFA) 108 (90 Base) MCG/ACT inhaler Inhale 2 puffs into the lungs every 6 (six) hours as needed for wheezing or shortness of breath. 8 g 2   empagliflozin (JARDIANCE) 25 MG TABS tablet Take 1 tablet (25 mg total) by mouth daily. 90 tablet 3   fluticasone (FLONASE) 50 MCG/ACT nasal spray SHAKE LIQUID AND USE 2 SPRAYS IN EACH NOSTRIL DAILY 16 g 5   glipiZIDE (GLUCOTROL) 5 MG tablet Take 1 tablet (5 mg total) by mouth daily before breakfast. 90 tablet 3   glucose blood test strip Test fasting blood sugars once a day 100 each 3   levothyroxine (SYNTHROID) 175 MCG tablet TAKE 1 TABLET BY MOUTH EVERY DAY BEFORE BREAKFAST 90 tablet 0   metFORMIN (GLUCOPHAGE) 500 MG tablet Take 2 tablets (1,000 mg total) by mouth 2 (two) times daily with a meal. 360 tablet 3   montelukast (  SINGULAIR) 10 MG tablet TAKE 1 TABLET BY MOUTH EVERYDAY AT BEDTIME (Patient taking differently: daily as needed.) 30 tablet 5   Multiple Vitamin (MULTIVITAMIN WITH MINERALS) TABS tablet Take 1 tablet by mouth daily.     pioglitazone (ACTOS) 15 MG tablet Take 1 tablet (15 mg total) by mouth daily. 90 tablet 3   rosuvastatin (CRESTOR) 20 MG tablet Take 1 tablet (20 mg total) by mouth daily. 90 tablet 3   SKYRIZI PEN 150 MG/ML SOAJ      tamsulosin (FLOMAX) 0.4 MG CAPS capsule Take 0.4 mg by mouth daily.     testosterone cypionate (DEPOTESTOSTERONE CYPIONATE) 200 MG/ML injection PLEASE SEE ATTACHED FOR DETAILED DIRECTIONS     doxycycline (VIBRA-TABS) 100 MG tablet Take 1 tablet (100 mg total) by mouth 2 (two) times  daily. (Patient not taking: Reported on 09/29/2022) 20 tablet 0   No facility-administered medications prior to visit.   No Known Allergies   Objective:   Today's Vitals   09/29/22 1458  BP: 118/68  Pulse: 78  Temp: 98.3 F (36.8 C)  TempSrc: Temporal  SpO2: 97%  Weight: 207 lb 6.4 oz (94.1 kg)  Height: 5\' 9"  (1.753 m)   Body mass index is 30.63 kg/m.   General: Well developed, well nourished. No acute distress. Skin: Warm and dry. There is an area of redness with associated thickening of the skin in the right axilla. There is no   sign of fluctuance or drainage. Neuro: CN II-XII intact. Normal sensation and DTR bilaterally. Psych: Alert and oriented. Normal mood and affect.  Health Maintenance Due  Topic Date Due   HIV Screening  Never done   Zoster Vaccines- Shingrix (1 of 2) Never done   Diabetic kidney evaluation - eGFR measurement  06/04/2022   Diabetic kidney evaluation - Urine ACR  06/04/2022   DTaP/Tdap/Td (2 - Td or Tdap) 06/29/2022   INFLUENZA VACCINE  08/13/2022     Assessment & Plan:   Problem List Items Addressed This Visit   None Visit Diagnoses     Cellulitis of right axilla    -  Primary   No current sign of abscess. I recommend a course of Septra and hot packing. Follow-up if not improving.   Relevant Medications   sulfamethoxazole-trimethoprim (BACTRIM DS) 800-160 MG tablet       Return in about 2 weeks (around 10/13/2022), or if symptoms worsen or fail to improve.   Loyola Mast, MD

## 2022-10-21 ENCOUNTER — Encounter: Payer: Self-pay | Admitting: Family Medicine

## 2022-10-22 ENCOUNTER — Ambulatory Visit: Payer: BC Managed Care – PPO | Admitting: Family Medicine

## 2022-10-22 ENCOUNTER — Encounter: Payer: Self-pay | Admitting: Family Medicine

## 2022-10-22 VITALS — BP 130/74 | HR 84 | Temp 97.7°F | Ht 69.0 in | Wt 207.8 lb

## 2022-10-22 DIAGNOSIS — L03111 Cellulitis of right axilla: Secondary | ICD-10-CM | POA: Insufficient documentation

## 2022-10-22 DIAGNOSIS — Z7984 Long term (current) use of oral hypoglycemic drugs: Secondary | ICD-10-CM

## 2022-10-22 DIAGNOSIS — E785 Hyperlipidemia, unspecified: Secondary | ICD-10-CM

## 2022-10-22 DIAGNOSIS — E119 Type 2 diabetes mellitus without complications: Secondary | ICD-10-CM

## 2022-10-22 DIAGNOSIS — E039 Hypothyroidism, unspecified: Secondary | ICD-10-CM

## 2022-10-22 MED ORDER — CEPHALEXIN 500 MG PO CAPS
ORAL_CAPSULE | ORAL | 0 refills | Status: AC
Start: 2022-10-22 — End: 2022-11-19

## 2022-10-22 NOTE — Assessment & Plan Note (Signed)
I recommend we check his  TSH level (patient plans to return for lab work). Continue levothyroxine 175 mcg daily

## 2022-10-22 NOTE — Assessment & Plan Note (Signed)
Needs annual DM labs. Continue metformin 500 mg two tablets bid, empagliflozin 25 (Jardiance) 25 mg daily, glipizide 5 mg daily, and pioglitazone 15 mg daily.

## 2022-10-22 NOTE — Assessment & Plan Note (Signed)
Stable.  Continue rosuvastatin 20 mg daily. ?

## 2022-10-22 NOTE — Progress Notes (Signed)
[facility] [address] [place] [address] Dept: [phone] Dept Fax: [phone]  Office Visit  Subjective:    Patient ID: Shaun Velasquez, male    DOB: [birth year], 62 y.o..   MRN: [id number]  Chief Complaint  Patient presents with   Cyst    C/o having cyst issues under Rt arm.  Scheduled for surgery 11/27/22    History of Present Illness:  Patient is in today for reassessment of infection in his right axilla. Mr. 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 He had this lanced by Dr. Doreene Burke on 8/30 and was treated with a course of clindamycin. The infection never really cleared. I saw him on 9/17 and treated him with a course of Septra.  Surgery is planned for mid- Nov. to remove the cyst.   Mr. Coker has a history of Type 2 diabetes. He is managed on metformin 500 mg two tablets bid, empagliflozin 25 (Jardiance) 25 mg daily, glipizide 5 mg daily, and pioglitazone 15 mg daily.    Mr. Volker has a history of  hyperlipidemia. He is managed on rosuvastatin 20 mg daily.   Mr. Lumadue has a history of hypothyroidism, managed on levothyroxine 175 mcg daily.  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   Past Surgical History:  Procedure Laterality Date   CHEST TUBE INSERTION     pt had collapsed lung   COLONOSCOPY  2011   on [address], not sure name of the practice(normal)   COLONOSCOPY W/ POLYPECTOMY     ELBOW SURGERY Right 2009   INCISION AND  DRAINAGE PERIRECTAL ABSCESS     REVERSE SHOULDER ARTHROPLASTY Right 12/27/2014   Procedure: RIGHT REVERSE SHOULDER ARTHROPLASTY;  Surgeon: Francena Hanly, MD;  Location: [facility] OR;  Service: Orthopedics;  Laterality: Right;   ROTATOR CUFF REPAIR Right 2011   SHOULDER ARTHROSCOPY W/ LABRAL REPAIR Right    SHOULDER ARTHROSCOPY WITH ROTATOR CUFF REPAIR Left 2003   SHOULDER ARTHROSCOPY WITH SUBACROMIAL DECOMPRESSION Left    Family History  Problem Relation Age of Onset   Allergic rhinitis Mother    Lung cancer Mother    Asthma Sister    Cancer Sister        Skin   Colon cancer Neg Hx    Colon polyps Neg Hx    Esophageal cancer Neg Hx    Stomach cancer Neg Hx    Rectal cancer Neg Hx    Outpatient Medications Prior to Visit  Medication Sig Dispense Refill   albuterol (VENTOLIN HFA) 108 (90 Base) MCG/ACT inhaler Inhale 2 puffs into the lungs every 6 (six) hours as needed for wheezing or shortness of breath. 8 g 2   empagliflozin (JARDIANCE) 25 MG TABS tablet Take 1 tablet (25 mg total) by mouth daily. 90 tablet 3   fluticasone (FLONASE) 50 MCG/ACT nasal spray SHAKE LIQUID AND USE 2 SPRAYS IN EACH NOSTRIL DAILY 16 g 5   glipiZIDE (GLUCOTROL) 5 MG tablet Take 1 tablet (5 mg total) by mouth daily before breakfast.  90 tablet 3   glucose blood test strip Test fasting blood sugars once a day 100 each 3   levothyroxine (SYNTHROID) 175 MCG tablet TAKE 1 TABLET BY MOUTH EVERY DAY BEFORE BREAKFAST 90 tablet 0   metFORMIN (GLUCOPHAGE) 500 MG tablet Take 2 tablets (1,000 mg total) by mouth 2 (two) times daily with a meal. 360 tablet 3   montelukast (SINGULAIR) 10 MG tablet TAKE 1 TABLET BY MOUTH EVERYDAY AT BEDTIME (Patient taking differently: daily as needed.) 30 tablet 5   Multiple Vitamin (MULTIVITAMIN WITH MINERALS) TABS tablet Take 1 tablet by mouth daily.     pioglitazone (ACTOS) 15 MG tablet Take 1 tablet (15 mg total) by mouth daily. 90 tablet 3   rosuvastatin (CRESTOR) 20 MG tablet Take 1 tablet (20  mg total) by mouth daily. 90 tablet 3   SKYRIZI PEN 150 MG/ML SOAJ      sulfamethoxazole-trimethoprim (BACTRIM DS) 800-160 MG tablet Take 1 tablet by mouth 2 (two) times daily. 20 tablet 0   tamsulosin (FLOMAX) 0.4 MG CAPS capsule Take 0.4 mg by mouth daily.     testosterone cypionate (DEPOTESTOSTERONE CYPIONATE) 200 MG/ML injection PLEASE SEE ATTACHED FOR DETAILED DIRECTIONS     No facility-administered medications prior to visit.   No Known Allergies   Objective:   Today's Vitals   10/22/22 0915  BP: 130/74  Pulse: 84  Temp: 97.7 F (36.5 C)  TempSrc: Temporal  SpO2: 99%  Weight: 207 lb 12.8 oz (94.3 kg)  Height: 5\' 9"  (1.753 m)   Body mass index is 30.69 kg/m.   General: Well developed, well nourished. No acute distress. Skin: There is an area of swelling and redness in the posterior axilla, near to an old surgical scar. There is some radiating redness in   a sunburst pattern around this. There is a 2-3 cm ovate area of induration, but without any fluctuance . Psych: Alert and oriented. Normal mood and affect.  Health Maintenance Due  Topic Date Due   HIV Screening  Never done   Zoster Vaccines- Shingrix (1 of 2) Never done   Diabetic kidney evaluation - eGFR measurement  06/04/2022   Diabetic kidney evaluation - Urine ACR  06/04/2022   DTaP/Tdap/Td (2 - Td or Tdap) 06/29/2022     Assessment & Plan:   Problem List Items Addressed This Visit       Endocrine   Hypothyroidism    I recommend we check his  TSH level (patient plans to return for lab work). Continue levothyroxine 175 mcg daily      Relevant Orders   TSH   Type 2 diabetes mellitus without complication, without long-term current use of insulin (HCC)    Needs annual DM labs. Continue metformin 500 mg two tablets bid, empagliflozin 25 (Jardiance) 25 mg daily, glipizide 5 mg daily, and pioglitazone 15 mg daily.      Relevant Orders   Microalbumin / creatinine urine ratio   Basic metabolic panel    Hemoglobin A1c   Urinalysis, Routine w reflex microscopic     Other   Cellulitis of right axilla - Primary    Clinically, this area remains infected. He should continue hot packs. I will treat him with a course of cephalexin and plan to continue a lower dose for prophylaxis to get him out to his surgery.      Relevant Medications   cephALEXin (KEFLEX) 500 MG capsule   Hyperlipidemia    Stable. Continue rosuvastatin 20 mg daily.  Relevant Orders   Lipid panel    Return in about 2 months (around 12/22/2022).   Loyola Mast, MD

## 2022-10-22 NOTE — Assessment & Plan Note (Signed)
Clinically, this area remains infected. He should continue hot packs. I will treat him with a course of cephalexin and plan to continue a lower dose for prophylaxis to get him out to his surgery.

## 2022-11-18 NOTE — Patient Instructions (Addendum)
SURGICAL WAITING ROOM VISITATION  Patients having surgery or a procedure may have no more than 2 support people in the waiting area - these visitors may rotate.    Children under the age of 72 must have an adult with them who is not the patient.  Due to an increase in RSV and influenza rates and associated hospitalizations, children ages 60 and under may not visit patients in [facility] hospitals.  If the patient needs to stay at the hospital during part of their recovery, the visitor guidelines for inpatient rooms apply. Pre-op nurse will coordinate an appropriate time for 1 support person to accompany patient in pre-op.  This support person may not rotate.    Please refer to the [facility] website for the visitor guidelines for Inpatients (after your surgery is over and you are in a regular room).    Your procedure is scheduled on: 11/27/22   Report to [facility] Main Entrance    Report to admitting at 7:15 AM   Call this number if you have problems the morning of surgery [phone]   Do not eat food or drink liquids :After Midnight.          If you have questions, please contact your surgeon's office.   FOLLOW BOWEL PREP AND ANY ADDITIONAL PRE OP INSTRUCTIONS YOU RECEIVED FROM YOUR SURGEON'S OFFICE!!!     Oral Hygiene is also important to reduce your risk of infection.                                    Remember - BRUSH YOUR TEETH THE MORNING OF SURGERY WITH YOUR REGULAR TOOTHPASTE  DENTURES WILL BE REMOVED PRIOR TO SURGERY PLEASE DO NOT APPLY "Poly grip" OR ADHESIVES!!!   Stop all vitamins and herbal supplements 7 days before surgery.   Take these medicines the morning of surgery with A SIP OF WATER: Albuterol, Keflex, Flonase, Levothyroxine, Rosuvastatin, Bactrim, Tamsulosin  DO NOT TAKE ANY ORAL DIABETIC MEDICATIONS DAY OF YOUR SURGERY  How to Manage Your Diabetes Before and After Surgery  Why is it important to control my blood sugar before and after  surgery? Improving blood sugar levels before and after surgery helps healing and can limit problems. A way of improving blood sugar control is eating a healthy diet by:  Eating less sugar and carbohydrates  Increasing activity/exercise  Talking with your doctor about reaching your blood sugar goals High blood sugars (greater than 180 mg/dL) can raise your risk of infections and slow your recovery, so you will need to focus on controlling your diabetes during the weeks before surgery. Make sure that the doctor who takes care of your diabetes knows about your planned surgery including the date and location.  How do I manage my blood sugar before surgery? Check your blood sugar at least 4 times a day, starting 2 days before surgery, to make sure that the level is not too high or low. Check your blood sugar the morning of your surgery when you wake up and every 2 hours until you get to the Short Stay unit. If your blood sugar is less than 70 mg/dL, you will need to treat for low blood sugar: Do not take insulin. Treat a low blood sugar (less than 70 mg/dL) with  cup of clear juice (cranberry or apple), 4 glucose tablets, OR glucose gel. Recheck blood sugar in 15 minutes after treatment (to make  sure it is greater than 70 mg/dL). If your blood sugar is not greater than 70 mg/dL on recheck, call [phone] for further instructions. Report your blood sugar to the short stay nurse when you get to Short Stay.  If you are admitted to the hospital after surgery: Your blood sugar will be checked by the staff and you will probably be given insulin after surgery (instead of oral diabetes medicines) to make sure you have good blood sugar levels. The goal for blood sugar control after surgery is 80-180 mg/dL.   WHAT DO I DO ABOUT MY DIABETES MEDICATION?  Do not take oral diabetes medicines (pills) the morning of surgery.  Hold Jardiance for 3 days. Last dose 11/23/22.  THE DAY BEFORE SURGERY, take  Metformin as prescribed. Only take morning dose of Glipizide, no afternoon or evening dose.     THE MORNING OF SURGERY, do not take Metformin or Glipizide.   Reviewed and Endorsed by [facility] Patient Education Committee, August 2015                              You may not have any metal on your body including jewelry, and body piercing             Do not wear lotions, powders, cologne, or deodorant              Men may shave face and neck.   Do not bring valuables to the hospital. [facility] IS NOT             RESPONSIBLE   FOR VALUABLES.   Contacts, glasses, dentures or bridgework may not be worn into surgery.  DO NOT BRING YOUR HOME MEDICATIONS TO THE HOSPITAL. PHARMACY WILL DISPENSE MEDICATIONS LISTED ON YOUR MEDICATION LIST TO YOU DURING YOUR ADMISSION IN THE HOSPITAL!    Patients discharged on the day of surgery will not be allowed to drive home.  Someone NEEDS to stay with you for the first 24 hours after anesthesia.   Special Instructions: Bring a copy of your healthcare power of attorney and living will documents the day of surgery if you haven't scanned them before.              Please read over the following fact sheets you were given: IF YOU HAVE QUESTIONS ABOUT YOUR PRE-OP INSTRUCTIONS PLEASE CALL [phone] Contras    If you received a COVID test during your pre-op visit  it is requested that you wear a mask when out in public, stay away from anyone that may not be feeling well and notify your surgeon if you develop symptoms. If you test positive for Covid or have been in contact with anyone that has tested positive in the last 10 days please notify you surgeon.    [facility] - Preparing for Surgery Before surgery, you can play an important role.  Because skin is not sterile, your skin needs to be as free of germs as possible.  You can reduce the number of germs on your skin by washing with CHG (chlorahexidine gluconate) soap before surgery.  CHG is an antiseptic  cleaner which kills germs and bonds with the skin to continue killing germs even after washing. Please DO NOT use if you have an allergy to CHG or antibacterial soaps.  If your skin becomes reddened/irritated stop using the CHG and inform your nurse when you arrive at Short Stay. Do not shave (including  legs and underarms) for at least 48 hours prior to the first CHG shower.  You may shave your face/neck.  Please follow these instructions carefully:  1.  Shower with CHG Soap the night before surgery and the  morning of surgery.  2.  If you choose to wash your hair, wash your hair first as usual with your normal  shampoo.  3.  After you shampoo, rinse your hair and body thoroughly to remove the shampoo.                             4.  Use CHG as you would any other liquid soap.  You can apply chg directly to the skin and wash.  Gently with a scrungie or clean washcloth.  5.  Apply the CHG Soap to your body ONLY FROM THE NECK DOWN.   Do   not use on face/ open                           Wound or open sores. Avoid contact with eyes, ears mouth and   genitals (private parts).                       Wash face,  Genitals (private parts) with your normal soap.             6.  Wash thoroughly, paying special attention to the area where your    surgery  will be performed.  7.  Thoroughly rinse your body with warm water from the neck down.  8.  DO NOT shower/wash with your normal soap after using and rinsing off the CHG Soap.                9.  Pat yourself dry with a clean towel.            10.  Wear clean pajamas.            11.  Place clean sheets on your bed the night of your first shower and do not  sleep with pets. Day of Surgery : Do not apply any lotions/deodorants the morning of surgery.  Please wear clean clothes to the hospital/surgery center.  FAILURE TO FOLLOW THESE INSTRUCTIONS MAY RESULT IN THE CANCELLATION OF YOUR SURGERY  PATIENT SIGNATURE_________________________________  NURSE  SIGNATURE__________________________________  ________________________________________________________________________

## 2022-11-18 NOTE — Progress Notes (Addendum)
COVID Vaccine Completed: yes  Date of COVID positive in last 90 days: no  PCP - [name], MD Cardiologist - n/a  Chest x-ray - n/a EKG - 11/19/22 Epic/chart Stress Test - 20 years ago per pt, looked okay ECHO - 20 years ago per pt, was okay Cardiac Cath - n/a Pacemaker/ICD device last checked: n/a Spinal Cord Stimulator: n/a  Bowel Prep - no  Sleep Study - n/a CPAP -   Fasting Blood Sugar - 130-140s Checks Blood Sugar  once a month  Last dose of GLP1 agonist-  N/A GLP1 instructions:  Hold 7 days before surgery    Last dose of SGLT-2 inhibitors-  N/A SGLT-2 instructions:  Hold 3 days before surgery, last dose 11/23/22   Blood Thinner Instructions: n/a Aspirin Instructions: Last Dose:  Activity level: Can go up a flight of stairs and perform activities of daily living without stopping and without symptoms of chest pain or shortness of breath.   Anesthesia review: A1C 8.4, DM2  Patient denies shortness of breath, fever, cough and chest pain at PAT appointment  Patient verbalized understanding of instructions that were given to them at the PAT appointment. Patient was also instructed that they will need to review over the PAT instructions again at home before surgery.

## 2022-11-19 ENCOUNTER — Other Ambulatory Visit: Payer: Self-pay

## 2022-11-19 ENCOUNTER — Encounter (HOSPITAL_COMMUNITY): Payer: Self-pay

## 2022-11-19 ENCOUNTER — Encounter (HOSPITAL_COMMUNITY)
Admission: RE | Admit: 2022-11-19 | Discharge: 2022-11-19 | Disposition: A | Discharge status: Home / Self Care | Payer: BC Managed Care – PPO | Source: Ambulatory Visit | Attending: Surgery | Admitting: Surgery

## 2022-11-19 VITALS — BP 135/86 | HR 78 | Temp 98.1°F | Resp 16 | Ht 69.5 in | Wt 203.0 lb

## 2022-11-19 DIAGNOSIS — E119 Type 2 diabetes mellitus without complications: Secondary | ICD-10-CM | POA: Diagnosis not present

## 2022-11-19 DIAGNOSIS — Z01818 Encounter for other preprocedural examination: Secondary | ICD-10-CM | POA: Diagnosis present

## 2022-11-19 HISTORY — DX: Cardiac murmur, unspecified: R01.1

## 2022-11-19 LAB — CBC
HCT: 46.4 % (ref 39.0–52.0)
Hemoglobin: 15.2 g/dL (ref 13.0–17.0)
MCH: 28.1 pg (ref 26.0–34.0)
MCHC: 32.8 g/dL (ref 30.0–36.0)
MCV: 85.8 fL (ref 80.0–100.0)
Platelets: 207 10*3/uL (ref 150–400)
RBC: 5.41 MIL/uL (ref 4.22–5.81)
RDW: 13.4 % (ref 11.5–15.5)
WBC: 5.7 10*3/uL (ref 4.0–10.5)
nRBC: 0 % (ref 0.0–0.2)

## 2022-11-19 LAB — BASIC METABOLIC PANEL
Anion gap: 12 (ref 5–15)
BUN: 21 mg/dL (ref 8–23)
CO2: 21 mmol/L — ABNORMAL LOW (ref 22–32)
Calcium: 9.5 mg/dL (ref 8.9–10.3)
Chloride: 102 mmol/L (ref 98–111)
Creatinine, Ser: 0.87 mg/dL (ref 0.61–1.24)
GFR, Estimated: >60 mL/min (ref >60)
Glucose, Bld: 158 mg/dL — ABNORMAL HIGH (ref 70–99)
Potassium: 4.3 mmol/L (ref 3.5–5.1)
Sodium: 135 mmol/L (ref 135–145)

## 2022-11-19 LAB — HEMOGLOBIN A1C
Hgb A1c MFr Bld: 8.4 % — ABNORMAL HIGH (ref 4.8–5.6)
Mean Plasma Glucose: 194.38 mg/dL

## 2022-11-19 LAB — GLUCOSE, CAPILLARY: Glucose-Capillary: 174 mg/dL — ABNORMAL HIGH (ref 70–99)

## 2022-11-25 NOTE — Anesthesia Preprocedure Evaluation (Addendum)
Anesthesia Evaluation  Patient identified by MRN, date of birth, ID band Patient awake    Reviewed: Allergy & Precautions, NPO status , Patient's Chart, lab work & pertinent test results  History of Anesthesia Complications (+) PONV and history of anesthetic complications (only one episode of PONV 25 years ago)  Airway Mallampati: III  TM Distance: >3 FB Neck ROM: Full    Dental  (+) Teeth Intact, Dental Advisory Given   Pulmonary former smoker Snores at night    Pulmonary exam normal breath sounds clear to auscultation       Cardiovascular negative cardio ROS Normal cardiovascular exam Rhythm:Regular Rate:Normal     Neuro/Psych  PSYCHIATRIC DISORDERS Anxiety     negative neurological ROS     GI/Hepatic Neg liver ROS,GERD  Controlled,,  Endo/Other  diabetes, Well Controlled, Type 2, Oral Hypoglycemic AgentsHypothyroidism  A1c 8.4 Obesity BMI 30  Renal/GU negative Renal ROS  negative genitourinary   Musculoskeletal  (+) Arthritis , Osteoarthritis,    Abdominal  (+) + obese  Peds  Hematology negative hematology ROS (+)   Anesthesia Other Findings   Reproductive/Obstetrics negative OB ROS                             Anesthesia Physical Anesthesia Plan  ASA: 2  Anesthesia Plan: MAC   Post-op Pain Management: Tylenol PO (pre-op)*   Induction:   PONV Risk Score and Plan: 2 and Propofol infusion and TIVA  Airway Management Planned: Natural Airway and Simple Face Mask  Additional Equipment: None  Intra-op Plan:   Post-operative Plan:   Informed Consent: I have reviewed the patients History and Physical, chart, labs and discussed the procedure including the risks, benefits and alternatives for the proposed anesthesia with the patient or authorized representative who has indicated his/her understanding and acceptance.     Dental advisory given  Plan Discussed with:  CRNA  Anesthesia Plan Comments:        Anesthesia Quick Evaluation

## 2022-11-27 ENCOUNTER — Ambulatory Visit (HOSPITAL_COMMUNITY): Payer: BC Managed Care – PPO | Admitting: Physician Assistant

## 2022-11-27 ENCOUNTER — Ambulatory Visit (HOSPITAL_COMMUNITY)
Admission: RE | Admit: 2022-11-27 | Discharge: 2022-11-27 | Disposition: A | Discharge status: Home / Self Care | Payer: BC Managed Care – PPO | Source: Ambulatory Visit | Attending: Surgery | Admitting: Surgery

## 2022-11-27 ENCOUNTER — Other Ambulatory Visit: Payer: Self-pay

## 2022-11-27 ENCOUNTER — Ambulatory Visit (HOSPITAL_COMMUNITY): Payer: BC Managed Care – PPO | Admitting: Anesthesiology

## 2022-11-27 ENCOUNTER — Encounter (HOSPITAL_COMMUNITY): Payer: Self-pay | Admitting: Surgery

## 2022-11-27 ENCOUNTER — Encounter (HOSPITAL_COMMUNITY)
Admission: RE | Disposition: A | Discharge status: Home / Self Care | Payer: Self-pay | Source: Ambulatory Visit | Attending: Surgery

## 2022-11-27 DIAGNOSIS — Z7984 Long term (current) use of oral hypoglycemic drugs: Secondary | ICD-10-CM | POA: Insufficient documentation

## 2022-11-27 DIAGNOSIS — Z79890 Hormone replacement therapy (postmenopausal): Secondary | ICD-10-CM | POA: Diagnosis not present

## 2022-11-27 DIAGNOSIS — E119 Type 2 diabetes mellitus without complications: Secondary | ICD-10-CM | POA: Diagnosis not present

## 2022-11-27 DIAGNOSIS — R222 Localized swelling, mass and lump, trunk: Secondary | ICD-10-CM | POA: Insufficient documentation

## 2022-11-27 DIAGNOSIS — Z6830 Body mass index (BMI) 30.0-30.9, adult: Secondary | ICD-10-CM | POA: Diagnosis not present

## 2022-11-27 DIAGNOSIS — E669 Obesity, unspecified: Secondary | ICD-10-CM | POA: Insufficient documentation

## 2022-11-27 DIAGNOSIS — K219 Gastro-esophageal reflux disease without esophagitis: Secondary | ICD-10-CM | POA: Insufficient documentation

## 2022-11-27 DIAGNOSIS — E039 Hypothyroidism, unspecified: Secondary | ICD-10-CM | POA: Diagnosis not present

## 2022-11-27 DIAGNOSIS — L905 Scar conditions and fibrosis of skin: Secondary | ICD-10-CM | POA: Insufficient documentation

## 2022-11-27 DIAGNOSIS — Z87891 Personal history of nicotine dependence: Secondary | ICD-10-CM | POA: Insufficient documentation

## 2022-11-27 HISTORY — PX: EXCISION OF KELOID: SHX6267

## 2022-11-27 LAB — GLUCOSE, CAPILLARY
Glucose-Capillary: 159 mg/dL — ABNORMAL HIGH (ref 70–99)
Glucose-Capillary: 176 mg/dL — ABNORMAL HIGH (ref 70–99)

## 2022-11-27 SURGERY — EXCISION, KELOID
Anesthesia: Monitor Anesthesia Care | Site: Arm Upper | Laterality: Right

## 2022-11-27 MED ORDER — PROPOFOL 500 MG/50ML IV EMUL
INTRAVENOUS | Status: DC | PRN
  Administered 2022-11-27: 180 ug/kg/min via INTRAVENOUS

## 2022-11-27 MED ORDER — ACETAMINOPHEN 500 MG PO TABS
1000.0000 mg | ORAL_TABLET | Freq: Once | ORAL | Status: AC
  Administered 2022-11-27: 1000 mg via ORAL
  Filled 2022-11-27: qty 2

## 2022-11-27 MED ORDER — PHENYLEPHRINE 80 MCG/ML (10ML) SYRINGE FOR IV PUSH (FOR BLOOD PRESSURE SUPPORT)
PREFILLED_SYRINGE | INTRAVENOUS | Status: DC | PRN
  Administered 2022-11-27 (×3): 160 ug via INTRAVENOUS
  Administered 2022-11-27: 120 ug via INTRAVENOUS

## 2022-11-27 MED ORDER — CEFAZOLIN SODIUM-DEXTROSE 1-4 GM/50ML-% IV SOLN
INTRAVENOUS | Status: DC | PRN
  Administered 2022-11-27: 2 g via INTRAVENOUS

## 2022-11-27 MED ORDER — HYDROMORPHONE HCL 1 MG/ML IJ SOLN: 0.2500 mg | INTRAMUSCULAR | Status: DC | PRN

## 2022-11-27 MED ORDER — AMISULPRIDE (ANTIEMETIC) 5 MG/2ML IV SOLN: 10.0000 mg | Freq: Once | INTRAVENOUS | Status: DC | PRN

## 2022-11-27 MED ORDER — MIDAZOLAM HCL 2 MG/2ML IJ SOLN
INTRAMUSCULAR | Status: AC
  Filled 2022-11-27: qty 2

## 2022-11-27 MED ORDER — PROPOFOL 10 MG/ML IV BOLUS
INTRAVENOUS | Status: DC | PRN
  Administered 2022-11-27: 20 mg via INTRAVENOUS

## 2022-11-27 MED ORDER — VASOPRESSIN 20 UNIT/ML IV SOLN
INTRAVENOUS | Status: AC
  Filled 2022-11-27: qty 1

## 2022-11-27 MED ORDER — OXYCODONE HCL 5 MG PO TABS: 5.0000 mg | ORAL_TABLET | Freq: Once | ORAL | Status: DC | PRN

## 2022-11-27 MED ORDER — CHLORHEXIDINE GLUCONATE 0.12 % MT SOLN
15.0000 mL | Freq: Once | OROMUCOSAL | Status: AC
  Administered 2022-11-27: 15 mL via OROMUCOSAL

## 2022-11-27 MED ORDER — DOCUSATE SODIUM 100 MG PO CAPS: 100.0000 mg | ORAL_CAPSULE | Freq: Two times a day (BID) | ORAL | 0 refills | Status: AC

## 2022-11-27 MED ORDER — MIDAZOLAM HCL 2 MG/2ML IJ SOLN
INTRAMUSCULAR | Status: DC | PRN
  Administered 2022-11-27: 2 mg via INTRAVENOUS

## 2022-11-27 MED ORDER — FENTANYL CITRATE (PF) 100 MCG/2ML IJ SOLN
INTRAMUSCULAR | Status: DC | PRN
  Administered 2022-11-27 (×2): 50 ug via INTRAVENOUS

## 2022-11-27 MED ORDER — VASOPRESSIN 20 UNIT/ML IV SOLN
INTRAVENOUS | Status: DC | PRN
  Administered 2022-11-27: 1 [IU] via INTRAVENOUS

## 2022-11-27 MED ORDER — MEPERIDINE HCL 50 MG/ML IJ SOLN: 6.2500 mg | INTRAMUSCULAR | Status: DC | PRN

## 2022-11-27 MED ORDER — FENTANYL CITRATE (PF) 100 MCG/2ML IJ SOLN
INTRAMUSCULAR | Status: AC
  Filled 2022-11-27: qty 2

## 2022-11-27 MED ORDER — BUPIVACAINE-EPINEPHRINE 0.25% -1:200000 IJ SOLN
INTRAMUSCULAR | Status: AC
  Filled 2022-11-27: qty 1

## 2022-11-27 MED ORDER — ONDANSETRON HCL 4 MG/2ML IJ SOLN
INTRAMUSCULAR | Status: DC | PRN
  Administered 2022-11-27: 4 mg via INTRAVENOUS

## 2022-11-27 MED ORDER — ORAL CARE MOUTH RINSE
15.0000 mL | Freq: Once | OROMUCOSAL | Status: AC
Start: 2022-11-27 — End: 2022-11-27

## 2022-11-27 MED ORDER — OXYCODONE HCL 5 MG PO TABS: 5.0000 mg | ORAL_TABLET | Freq: Three times a day (TID) | ORAL | 0 refills | Status: AC | PRN

## 2022-11-27 MED ORDER — OXYCODONE HCL 5 MG/5ML PO SOLN: 5.0000 mg | Freq: Once | ORAL | Status: DC | PRN

## 2022-11-27 MED ORDER — LIDOCAINE 2% (20 MG/ML) 5 ML SYRINGE
INTRAMUSCULAR | Status: DC | PRN
  Administered 2022-11-27: 100 mg via INTRAVENOUS

## 2022-11-27 MED ORDER — LACTATED RINGERS IV SOLN: INTRAVENOUS | Status: DC

## 2022-11-27 MED ORDER — INSULIN ASPART 100 UNIT/ML IJ SOLN
0.0000 [IU] | INTRAMUSCULAR | Status: DC | PRN
  Administered 2022-11-27: 2 [IU] via SUBCUTANEOUS
  Filled 2022-11-27: qty 1

## 2022-11-27 MED ORDER — KETOROLAC TROMETHAMINE 30 MG/ML IJ SOLN: 30.0000 mg | Freq: Once | INTRAMUSCULAR | Status: DC | PRN

## 2022-11-27 MED ORDER — ONDANSETRON HCL 4 MG/2ML IJ SOLN
4.0000 mg | Freq: Once | INTRAMUSCULAR | Status: DC | PRN
Start: 2022-11-27 — End: 2022-11-27

## 2022-11-27 MED ORDER — BUPIVACAINE-EPINEPHRINE 0.25% -1:200000 IJ SOLN
INTRAMUSCULAR | Status: DC | PRN
  Administered 2022-11-27: 12 mg

## 2022-11-27 SURGICAL SUPPLY — 26 items
BAG COUNTER SPONGE SURGICOUNT (BAG) IMPLANT
COVER SURGICAL LIGHT HANDLE (MISCELLANEOUS) ×1 IMPLANT
DERMABOND ADVANCED .7 DNX12 (GAUZE/BANDAGES/DRESSINGS) IMPLANT
DRAPE LAPAROSCOPIC ABDOMINAL (DRAPES) IMPLANT
DRAPE LAPAROTOMY TRNSV 102X78 (DRAPES) IMPLANT
DRAPE UTILITY XL STRL (DRAPES) ×1 IMPLANT
ELECT REM PT RETURN 15FT ADLT (MISCELLANEOUS) ×1 IMPLANT
GAUZE SPONGE 4X4 12PLY STRL (GAUZE/BANDAGES/DRESSINGS) ×1 IMPLANT
GLOVE BIO SURGEON STRL SZ 6 (GLOVE) ×1 IMPLANT
GLOVE INDICATOR 6.5 STRL GRN (GLOVE) ×1 IMPLANT
GOWN STRL REUS W/ TWL LRG LVL3 (GOWN DISPOSABLE) ×1 IMPLANT
GOWN STRL REUS W/TWL LRG LVL3 (GOWN DISPOSABLE) ×1
KIT BASIN OR (CUSTOM PROCEDURE TRAY) ×1 IMPLANT
KIT TURNOVER KIT A (KITS) IMPLANT
MARKER SKIN DUAL TIP RULER LAB (MISCELLANEOUS) IMPLANT
NDL HYPO 22X1.5 SAFETY MO (MISCELLANEOUS) ×1 IMPLANT
NEEDLE HYPO 22X1.5 SAFETY MO (MISCELLANEOUS) ×1
PACK GENERAL/GYN (CUSTOM PROCEDURE TRAY) ×1 IMPLANT
SPIKE FLUID TRANSFER (MISCELLANEOUS) IMPLANT
SUT MNCRL AB 4-0 PS2 18 (SUTURE) ×1 IMPLANT
SUT PROLENE 2 0 SH DA (SUTURE) IMPLANT
SUT VIC AB 3-0 SH 27 (SUTURE) ×1
SUT VIC AB 3-0 SH 27XBRD (SUTURE) ×1 IMPLANT
SYR CONTROL 10ML LL (SYRINGE) ×1 IMPLANT
TOWEL OR 17X26 10 PK STRL BLUE (TOWEL DISPOSABLE) ×1 IMPLANT
TOWEL OR NON WOVEN STRL DISP B (DISPOSABLE) ×1 IMPLANT

## 2022-11-27 NOTE — Op Note (Signed)
Operative Note  [facility]  [id number]  [id number]  11/27/2022   Surgeon: Phylliss Blakes MD FACS  Procedure performed: excision of chronic inflammatory mass, right posterior axilla   Preop diagnosis: recurrent infection/suspected subcutaneous cyst  Post-op diagnosis/intraop findings: chronically inflamed soft tissue extending via cord-like scar tissue under the triceps/shoulder girdle towards the joint   Image: lesion is bivalved; skin faces the ruler and the deep/cord structure is extending to the right side of the picture   Specimens: Above lesion was sent for routine pathology as well as microbiology with request for aerobic, anaerobic, fungal, acid-fast/Mycobacterium stains Retained items: no  EBL: minimal cc Complications: none   Description of procedure: After confirming informed consent the patient was taken to the operating room and placed in the left lateral decubitus position on the operating room table where MAC was initiated, preoperative antibiotics were administered, SCDs applied, and a formal timeout was performed.  The right upper extremity/back/axilla were prepped and draped in usual sterile fashion.  There is vague, blanching erythema extending approximately 20 cm along the axis of the upper arm by about 10 cm transverse, within which is an approximately 5 cm area of intense erythema, induration with central fluctuance but no active drainage.  This latter area was anesthetized with a field block using quarter percent Marcaine with epinephrine and then an incision was made with a 15 blade encompassing this area.  The soft tissues were dissected with cautery and noted to be soft and healthy appearing with no active purulence or evidence of infection.  The lesion did seem to have a plane around it which was followed, along the deep/superior aspect this narrowed down to a firm cordlike structure extending up towards the joint underneath the triceps.  This was followed as deep as  was visible/palpable and then amputated.  The lesion was thus excised completely.  Again there was no purulence or evidence of infection.  The deeper aspect of this cord was ablated with cautery.  Hemostasis was ensured within the wound.  The deep soft tissue was reapproximated with interrupted 3-0 Vicryl and the skin was closed with a segmental running 2-0 Prolene.  Sterile dressings were applied.  The patient was then awakened, extubated and taken to PACU in stable condition.    All counts were correct at the completion of the case.

## 2022-11-27 NOTE — Transfer of Care (Signed)
Immediate Anesthesia Transfer of Care Note  Patient: Shaun Velasquez  Procedure(s) Performed: EXCISION OF SUBCUTANEOUS CYST RIGHT UPPER ARM (Right: Arm Upper)  Patient Location: PACU  Anesthesia Type:MAC  Level of Consciousness: sedated  Airway & Oxygen Therapy: Patient Spontanous Breathing and Patient connected to face mask  Post-op Assessment: Report given to RN  Post vital signs: Reviewed and stable  Last Vitals:  Vitals Value Taken Time  BP 94/57 11/27/22 1023  Temp 36.3 C 11/27/22 1023  Pulse 68 11/27/22 1025  Resp 13 11/27/22 1025  SpO2 96 % 11/27/22 1025  Vitals shown include unfiled device data.  Last Pain:  Vitals:   11/27/22 0808  TempSrc:   PainSc: 2       Patients Stated Pain Goal: 5 (11/27/22 4098)  Complications: No notable events documented.

## 2022-11-27 NOTE — Discharge Instructions (Signed)
GENERAL SURGERY: POST OP INSTRUCTIONS  EAT Gradually transition to a high fiber diet with a fiber supplement over the next few weeks after discharge.  Start with a pureed / full liquid diet (see below)  WALK Walk an hour a day (cumulative, not all at once).  Control your pain to do that.    CONTROL PAIN Control pain so that you can walk, sleep, tolerate sneezing/coughing, go up/down stairs.  HAVE A BOWEL MOVEMENT DAILY Keep your bowels regular to avoid problems.  OK to try a laxative to override constipation.  OK to use an antidairrheal to slow down diarrhea.  Call if not better after 2 tries  CALL IF YOU HAVE PROBLEMS/CONCERNS Call if you are still struggling despite following these instructions. Call if you have concerns not answered by these instructions    DIET: Follow a light bland diet & liquids the first 24 hours after arrival home, such as soup, liquids, starches, etc.  Be sure to drink plenty of fluids.  Quickly advance to a usual solid diet within a few days.  Avoid fast food or heavy meals as your are more likely to get nauseated or have irregular bowels.  A low-sugar, high-fiber diet for the rest of your life is ideal.   Take your usually prescribed home medications unless otherwise directed. PAIN CONTROL: Pain is best controlled by a usual combination of three different methods TOGETHER: Ice/Heat Over the counter pain medication Prescription pain medication Most patients will experience some swelling and bruising around the incisions.  Ice packs or heating pads (30-60 minutes up to 6 times a day) will help. Use ice for the first few days to help decrease swelling and bruising, then switch to heat to help relax tight/sore spots and speed recovery.  Some people prefer to use ice alone, heat alone, alternating between ice & heat.  Experiment to what works for you.  Swelling and bruising can take several weeks to resolve.   It is helpful to take an over-the-counter pain  medication regularly for the first few weeks.  Choose one of the following that works best for you: Naproxen (Aleve, etc)  Two 220mg  tabs twice a day OR Ibuprofen (Advil, etc) Three 200mg  tabs four times a day (every meal & bedtime) AND Acetaminophen (Tylenol, etc) 500-650mg  four times a day (every meal & bedtime) A  prescription for pain medication (such as oxycodone, hydrocodone, etc) should be given to you upon discharge.  Take your pain medication as prescribed.  If you are having problems/concerns with the prescription medicine (does not control pain, nausea, vomiting, rash, itching, etc), please call us [phone] to see if we need to switch you to a different pain medicine that will work better for you and/or control your side effect better. If you need a refill on your pain medication, please contact your pharmacy.  They will contact our office to request authorization. Prescriptions will not be filled after 5 pm or on week-ends. Avoid getting constipated.  Between the surgery and the pain medications, it is common to experience some constipation.  Increasing fluid intake and taking a fiber supplement (such as Metamucil, Citrucel, FiberCon, MiraLax, etc) 1-2 times a day regularly will usually help prevent this problem from occurring.  A mild laxative (prune juice, Milk of Magnesia, MiraLax, etc) should be taken according to package directions if there are no bowel movements after 48 hours.   Wash / shower every day, starting 2 days after surgery.  Let the soap and water  run over the incision and pat dry.  Do not soak or submerge incision.  No lotions or ointments to incision. Remove your outer bandage 2 days after surgery.  You may leave the incision open to air.  You may replace a dressing/Band-Aid to cover the incision for comfort if you wish.  Okay to wear sling for comfort if desired.     ACTIVITIES as tolerated:   You may resume regular (light) daily activities beginning the next  day--such as daily self-care, walking, climbing stairs--gradually increasing activities as tolerated.  If you can walk 30 minutes without difficulty, it is safe to try more intense activity such as jogging, treadmill, bicycling, low-impact aerobics, swimming, etc. Save the most intensive and strenuous activity for last such as sit-ups, heavy lifting, contact sports, etc  Refrain from any heavy lifting or straining until you are off narcotics for pain control.   DO NOT PUSH THROUGH PAIN.  Let pain be your guide: If it hurts to do something, don't do it.  Pain is your body warning you to avoid that activity for another week until the pain goes down. You may drive when you are no longer taking prescription pain medication, you can comfortably wear a seatbelt, and you can safely maneuver your car and apply brakes. You may have sexual intercourse when it is comfortable.  FOLLOW UP in our office Please call [facility] at [phone] to set up an appointment to see your surgeon in the office for a follow-up appointment approximately 2-3 weeks after your surgery. Make sure that you call for this appointment the day you arrive home to insure a convenient appointment time. 9. IF YOU HAVE DISABILITY OR FAMILY LEAVE FORMS, BRING THEM TO THE OFFICE FOR PROCESSING.  DO NOT GIVE THEM TO YOUR DOCTOR.   WHEN TO CALL us [phone]: Poor pain control Reactions / problems with new medications (rash/itching, nausea, etc)  Fever over 101.5 F (38.5 C) Worsening swelling or bruising Continued bleeding from incision. Increased pain, redness, or drainage from the incision Difficulty breathing / swallowing   The clinic staff is available to answer your questions during regular business hours (8:30am-5pm).  Please don't hesitate to call and ask to speak to one of our nurses for clinical concerns.   If you have a medical emergency, go to the nearest emergency room or call 911.  A surgeon from [facility] is  always on call at the [facility], [place] [address], [place], [place]  [address] ? MAIN: [phone] ? TOLL FREE: [phone] ?  FAX [phone] [email or web address]

## 2022-11-27 NOTE — H&P (Signed)
Shaun Velasquez [id number]   Referring Provider:  [name], MD   Subjective   Chief Complaint: New Consultation (evaulate seb. cyst of R axilla. 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  He has been dealing with this since January of this year and has been on numerous courses of antibiotics.  He had it lanced and reports that the output was bloody.  He states that his orthopedic surgeon has seen it and told him it was a cyst, did not have any concern that it had anything to do with his remote shoulder surgery.  Over the antibiotics and, the lesion becomes more inflamed and infected.    Review of Systems: A complete review of systems was obtained from the patient.  I have reviewed this information and discussed as appropriate with the patient.  See HPI as well for other ROS.   Medical History: Past Medical History:  Diagnosis Date   Arthritis    Diabetes mellitus without complication (CMS/HHS-HCC)    Hyperlipidemia    Thyroid disease     There is no problem list on file for this patient.   Past Surgical History:  Procedure Laterality Date   TOTAL SHOULDER REPLACEMENT     left 2022, right 2016     No Known Allergies  Current Outpatient Medications on File Prior to Visit  Medication Sig Dispense Refill   empagliflozin (JARDIANCE) 25 mg tablet Take 1 tablet by mouth once daily     levothyroxine (SYNTHROID) 175 MCG tablet Take 1 tablet by mouth every morning before breakfast (0630)     metFORMIN (GLUCOPHAGE) 500 MG tablet Take 1,000 mg by mouth 2 (two) times daily with meals     rosuvastatin (CRESTOR) 20 MG tablet Take 20 mg by mouth  once daily     SKYRIZI 150 mg/mL PnIj      No current facility-administered medications on file prior to visit.    Family History  Problem Relation Age of Onset   Lung cancer Mother    Skin cancer Sister      Social History   Tobacco Use  Smoking Status Former   Types: Cigarettes  Smokeless Tobacco Never     Social History   Socioeconomic History   Marital status: Married  Tobacco Use   Smoking status: Former    Types: Cigarettes   Smokeless tobacco: Never  Vaping Use   Vaping status: Never Used  Substance and Sexual Activity   Alcohol use: Not Currently   Drug use: Never    Objective:    Vitals:   08/12/22 1039  BP: 118/68  Pulse: 86  Temp: 36.7 C (98 F)  SpO2: 98%  Weight: 93.4 kg (205 lb 12.8 oz)  Height: 176.5 cm (5' 9.5")  PainSc:   2  PainLoc: Other (Comment)    Body mass index is 29.96 kg/m.  Gen: A&Ox3, no distress  Unlabored respirations On the posterior lateral surface of his right upper arm, just outside the axilla posteriorly is an approximately 2 cm circumference subcutaneous cyst with about 2 additional centimeters of erythema extending proximally and distally with induration. He showed me a photo of when it was very inflamed early on  and the picture itself looks like a staph phlegmon  Assessment and Plan:  Diagnoses and all orders for this visit:  Subcutaneous mass  He has been dealing with this for over 6 months without resolution despite that side I&D as well as multiple courses of antibiotics.  We discussed excision under MAC and depending on state of inflammation at the time of surgery may require an open wound.  Discussed risks of bleeding, recurrence, pain, scarring, etc.  Will plan to proceed at his soonest convenience.  Taneeka Curtner Carlye Grippe, MD

## 2022-11-27 NOTE — Anesthesia Postprocedure Evaluation (Signed)
Anesthesia Post Note  Patient: Shaun Velasquez  Procedure(s) Performed: EXCISION OF SUBCUTANEOUS CYST RIGHT UPPER ARM (Right: Arm Upper)     Patient location during evaluation: PACU Anesthesia Type: MAC Level of consciousness: awake and alert Pain management: pain level controlled Vital Signs Assessment: post-procedure vital signs reviewed and stable Respiratory status: spontaneous breathing, nonlabored ventilation and respiratory function stable Cardiovascular status: blood pressure returned to baseline and stable Postop Assessment: no apparent nausea or vomiting Anesthetic complications: no   No notable events documented.  Last Vitals:  Vitals:   11/27/22 1100 11/27/22 1111  BP: 117/75 123/78  Pulse: 64 65  Resp: 12 18  Temp:    SpO2: 94% 96%    Last Pain:  Vitals:   11/27/22 1111  TempSrc: Oral  PainSc: 0-No pain                 Lannie Fields

## 2022-11-28 ENCOUNTER — Encounter (HOSPITAL_COMMUNITY): Payer: Self-pay | Admitting: Surgery

## 2022-11-30 LAB — SURGICAL PATHOLOGY

## 2022-12-01 LAB — ACID FAST SMEAR (AFB, MYCOBACTERIA): Acid Fast Smear: NEGATIVE

## 2022-12-02 LAB — AEROBIC/ANAEROBIC CULTURE W GRAM STAIN (SURGICAL/DEEP WOUND): Culture: NO GROWTH

## 2022-12-07 ENCOUNTER — Encounter: Payer: Self-pay | Admitting: Family Medicine

## 2022-12-07 DIAGNOSIS — L409 Psoriasis, unspecified: Secondary | ICD-10-CM

## 2022-12-18 ENCOUNTER — Other Ambulatory Visit (INDEPENDENT_AMBULATORY_CARE_PROVIDER_SITE_OTHER): Payer: BC Managed Care – PPO

## 2022-12-18 DIAGNOSIS — E785 Hyperlipidemia, unspecified: Secondary | ICD-10-CM | POA: Diagnosis not present

## 2022-12-18 DIAGNOSIS — E039 Hypothyroidism, unspecified: Secondary | ICD-10-CM

## 2022-12-18 DIAGNOSIS — L409 Psoriasis, unspecified: Secondary | ICD-10-CM

## 2022-12-18 DIAGNOSIS — E119 Type 2 diabetes mellitus without complications: Secondary | ICD-10-CM

## 2022-12-18 LAB — LIPID PANEL
Cholesterol: 153 mg/dL (ref 0–200)
HDL: 38 mg/dL — ABNORMAL LOW (ref >39.00)
LDL Cholesterol: 91 mg/dL (ref 0–99)
NonHDL: 115.43
Total CHOL/HDL Ratio: 4
Triglycerides: 121 mg/dL (ref 0.0–149.0)
VLDL: 24.2 mg/dL (ref 0.0–40.0)

## 2022-12-18 LAB — BASIC METABOLIC PANEL
BUN: 25 mg/dL — ABNORMAL HIGH (ref 6–23)
CO2: 27 meq/L (ref 19–32)
Calcium: 9.6 mg/dL (ref 8.4–10.5)
Chloride: 99 meq/L (ref 96–112)
Creatinine, Ser: 0.92 mg/dL (ref 0.40–1.50)
GFR: 88.46 mL/min (ref >60.00)
Glucose, Bld: 132 mg/dL — ABNORMAL HIGH (ref 70–99)
Potassium: 4.3 meq/L (ref 3.5–5.1)
Sodium: 136 meq/L (ref 135–145)

## 2022-12-18 LAB — URINALYSIS, ROUTINE W REFLEX MICROSCOPIC
Bilirubin Urine: NEGATIVE
Hgb urine dipstick: NEGATIVE
Ketones, ur: NEGATIVE
Leukocytes,Ua: NEGATIVE
Nitrite: NEGATIVE
Specific Gravity, Urine: 1.01 (ref 1.000–1.030)
Total Protein, Urine: NEGATIVE
Urine Glucose: >=1000 — AB
Urobilinogen, UA: 0.2 (ref 0.0–1.0)
pH: 7 (ref 5.0–8.0)

## 2022-12-18 LAB — HEMOGLOBIN A1C: Hgb A1c MFr Bld: 8.6 % — ABNORMAL HIGH (ref 4.6–6.5)

## 2022-12-18 LAB — MICROALBUMIN / CREATININE URINE RATIO
Creatinine,U: 84.9 mg/dL
Microalb Creat Ratio: 4.3 mg/g (ref 0.0–30.0)
Microalb, Ur: 3.7 mg/dL — ABNORMAL HIGH (ref 0.0–1.9)

## 2022-12-18 LAB — TSH: TSH: 0.05 u[IU]/mL — ABNORMAL LOW (ref 0.35–5.50)

## 2022-12-18 MED ORDER — PIOGLITAZONE HCL 30 MG PO TABS: 30.0000 mg | ORAL_TABLET | Freq: Every day | ORAL | 3 refills | Status: DC

## 2022-12-18 MED ORDER — LEVOTHYROXINE SODIUM 150 MCG PO TABS: 150.0000 ug | ORAL_TABLET | Freq: Every day | ORAL | 3 refills | Status: DC

## 2022-12-18 NOTE — Addendum Note (Signed)
Addended by: Loyola Mast on: 12/18/2022 04:53 PM   Modules accepted: Orders

## 2022-12-21 LAB — QUANTIFERON-TB GOLD PLUS
Mitogen-NIL: 5.93 [IU]/mL
NIL: 0.05 [IU]/mL
QuantiFERON-TB Gold Plus: NEGATIVE
TB1-NIL: 0 [IU]/mL
TB2-NIL: 0 [IU]/mL

## 2022-12-28 ENCOUNTER — Ambulatory Visit (INDEPENDENT_AMBULATORY_CARE_PROVIDER_SITE_OTHER): Payer: BC Managed Care – PPO | Admitting: Family Medicine

## 2022-12-28 ENCOUNTER — Encounter: Payer: Self-pay | Admitting: Family Medicine

## 2022-12-28 VITALS — BP 132/82 | HR 71 | Ht 69.5 in | Wt 207.0 lb

## 2022-12-28 DIAGNOSIS — Z9889 Other specified postprocedural states: Secondary | ICD-10-CM

## 2022-12-28 DIAGNOSIS — S161XXA Strain of muscle, fascia and tendon at neck level, initial encounter: Secondary | ICD-10-CM | POA: Diagnosis not present

## 2022-12-28 DIAGNOSIS — E119 Type 2 diabetes mellitus without complications: Secondary | ICD-10-CM | POA: Diagnosis not present

## 2022-12-28 DIAGNOSIS — Z7984 Long term (current) use of oral hypoglycemic drugs: Secondary | ICD-10-CM | POA: Diagnosis not present

## 2022-12-28 MED ORDER — EMPAGLIFLOZIN 25 MG PO TABS: 25.0000 mg | ORAL_TABLET | Freq: Every day | ORAL | 3 refills | Status: DC

## 2022-12-28 MED ORDER — GLIPIZIDE 5 MG PO TABS: 5.0000 mg | ORAL_TABLET | Freq: Every day | ORAL | 3 refills | Status: DC

## 2022-12-28 NOTE — Progress Notes (Addendum)
[facility] [address] [place] [address] Dept: [phone] Dept Fax: [phone]  Chronic Care Office Visit  Subjective:    Patient ID: Shaun Velasquez, male    DOB: Nov 27, 1960, 62 y.o..   MRN: [id number]  Chief Complaint  Patient presents with   Diabetes    F/u DM and c/o having tightness/pain in back of RT side head upper neck.   Average BS at home 140's   History of Present Illness:  Patient is in today for reassessment of chronic medical issues.  Shaun Velasquez has a history of Type 2 diabetes. He is managed on metformin 500 mg two tablets bid, empagliflozin 25 (Jardiance) 25 mg daily, glipizide 5 mg daily, and pioglitazone 30 mg daily.  However, he notes today that he had not been taking the pioglitazone. He has now started this, based on his recent A1c that had increased.  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 He notes this seems to be healing okay, though he has noted some redness.  Shaun Velasquez notes some right posterior neck pain for many months. He does not recall any specific injury to this area. He denies any change in sensation to the occipital scalp. He has not tried any specific therapies for this.   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   Past Surgical History:  Procedure Laterality Date   CHEST TUBE INSERTION     pt had collapsed lung   COLONOSCOPY  2011   on [address], not sure name of the practice(normal)   COLONOSCOPY W/ POLYPECTOMY     CYST  REMOVAL HAND     arm; 2024   ELBOW SURGERY Right 2009   EXCISION OF KELOID Right 11/27/2022   Procedure: EXCISION OF SUBCUTANEOUS CYST RIGHT UPPER ARM;  Surgeon: Berna Bue, MD;  Location: [facility];  Service: General;  Laterality: Right;   INCISION AND DRAINAGE PERIRECTAL ABSCESS     REVERSE SHOULDER ARTHROPLASTY Right 12/27/2014   Procedure: RIGHT REVERSE SHOULDER ARTHROPLASTY;  Surgeon: Francena Hanly, MD;  Location: [facility];  Service: Orthopedics;  Laterality: Right;   ROTATOR CUFF REPAIR Right 2011   SHOULDER ARTHROSCOPY W/ LABRAL REPAIR Right    SHOULDER ARTHROSCOPY WITH ROTATOR CUFF REPAIR Left 2003   SHOULDER ARTHROSCOPY WITH SUBACROMIAL DECOMPRESSION Left    WISDOM TOOTH EXTRACTION     Family History  Problem Relation Age of Onset   Allergic rhinitis Mother    Lung cancer Mother    Asthma Sister    Cancer Sister        Skin   Colon cancer Neg Hx    Colon polyps Neg Hx    Esophageal cancer Neg Hx    Stomach cancer Neg Hx    Rectal cancer Neg Hx    Outpatient Medications Prior to Visit  Medication Sig Dispense Refill   albuterol (VENTOLIN HFA) 108 (90 Base) MCG/ACT inhaler Inhale 2 puffs into the lungs every 6 (six) hours as needed for wheezing or shortness of breath. 8 g 2   fluticasone (FLONASE) 50 MCG/ACT  nasal spray SHAKE LIQUID AND USE 2 SPRAYS IN EACH NOSTRIL DAILY (Patient taking differently: Place 2 sprays into both nostrils daily as needed for allergies.) 16 g 5   glucose blood test strip Test fasting blood sugars once a day 100 each 3   levothyroxine (SYNTHROID) 150 MCG tablet Take 1 tablet (150 mcg total) by mouth daily before breakfast. 90 tablet 3   metFORMIN (GLUCOPHAGE) 500 MG tablet Take 2 tablets (1,000 mg total) by mouth 2 (two) times daily with a meal. 360 tablet 3   montelukast (SINGULAIR) 10 MG tablet TAKE 1 TABLET BY MOUTH EVERYDAY AT BEDTIME (Patient taking differently: Take 10 mg by mouth daily as needed (allergies).) 30 tablet 5   Multiple Vitamin  (MULTIVITAMIN WITH MINERALS) TABS tablet Take 1 tablet by mouth daily.     pioglitazone (ACTOS) 30 MG tablet Take 1 tablet (30 mg total) by mouth daily. 90 tablet 3   rosuvastatin (CRESTOR) 20 MG tablet Take 1 tablet (20 mg total) by mouth daily. 90 tablet 3   SKYRIZI PEN 150 MG/ML SOAJ Inject 150 mg into the skin every 3 (three) months.     tamsulosin (FLOMAX) 0.4 MG CAPS capsule Take 0.4 mg by mouth daily.     testosterone cypionate (DEPOTESTOSTERONE CYPIONATE) 200 MG/ML injection Inject 100 mg into the muscle once a week.     empagliflozin (JARDIANCE) 25 MG TABS tablet Take 1 tablet (25 mg total) by mouth daily. 90 tablet 3   glipiZIDE (GLUCOTROL) 5 MG tablet Take 1 tablet (5 mg total) by mouth daily before breakfast. 90 tablet 3   No facility-administered medications prior to visit.   No Known Allergies Objective:   Today's Vitals   12/28/22 0756  BP: 132/82  Pulse: 71  TempSrc: Temporal  SpO2: 98%  Weight: 207 lb (93.9 kg)  Height: 5' 9.5" (1.765 m)   Body mass index is 30.13 kg/m.   General: Well developed, well nourished. No acute distress. Neck: Supple. Pain indicated over th right paracervical muscle columns. No redness or skin changes noted. Extremities: Surgical wound in the right axilla is healing. There is some redness around the wound and in a band extending   out from the wound. This is non tender and there is no sign of drainage. Feet- Skin intact. No sign of maceration between toes. Nails are normal. Dorsalis pedis and posterior tibial artery pulses are   normal. 5.07 monofilament testing normal. Psych: Alert and oriented. Normal mood and affect.  Health Maintenance Due  Topic Date Due   HIV Screening  Never done   Zoster Vaccines- Shingrix (1 of 2) Never done   DTaP/Tdap/Td (2 - Td or Tdap) 06/29/2022   OPHTHALMOLOGY EXAM  12/12/2022   Lab Results Last hemoglobin A1c Lab Results  Component Value Date   HGBA1C 8.6 (H) 12/18/2022     Assessment & Plan:    Problem List Items Addressed This Visit       Endocrine   Type 2 diabetes mellitus without complication, without long-term current use of insulin (HCC) - Primary   Since Shaun Velasquez only recently started taking his pioglitazone, I will have him continue with this addition to his therapy, including metformin 500 mg two tablets bid, empagliflozin 25 (Jardiance) 25 mg daily, glipizide 5 mg daily, and pioglitazone 30 mg daily. I will reassess this in 3 months.      Relevant Medications   glipiZIDE (GLUCOTROL) 5 MG tablet   empagliflozin (JARDIANCE) 25 MG TABS tablet  Other Visit Diagnoses       Strain of neck muscle, initial encounter       Recommend hot compresses and stretches to promote resolution.     Status post axillary skin cyst surgery       Healing. Follow-up with surgeon.       Return in about 3 months (around 03/28/2023) for Reassessment.   Loyola Mast, MD

## 2022-12-28 NOTE — Assessment & Plan Note (Signed)
Since Mr. Hattabaugh only recently started taking his pioglitazone, I will have him continue with this addition to his therapy, including metformin 500 mg two tablets bid, empagliflozin 25 (Jardiance) 25 mg daily, glipizide 5 mg daily, and pioglitazone 30 mg daily. I will reassess this in 3 months.

## 2022-12-29 LAB — FUNGUS CULTURE WITH STAIN

## 2022-12-29 LAB — FUNGAL ORGANISM REFLEX

## 2022-12-29 LAB — FUNGUS CULTURE RESULT

## 2023-01-13 LAB — ACID FAST CULTURE WITH REFLEXED SENSITIVITIES (MYCOBACTERIA): Acid Fast Culture: NEGATIVE

## 2023-01-15 ENCOUNTER — Other Ambulatory Visit: Payer: Self-pay | Admitting: Family Medicine

## 2023-01-15 DIAGNOSIS — E119 Type 2 diabetes mellitus without complications: Secondary | ICD-10-CM

## 2023-01-15 DIAGNOSIS — E785 Hyperlipidemia, unspecified: Secondary | ICD-10-CM

## 2023-01-15 DIAGNOSIS — J3089 Other allergic rhinitis: Secondary | ICD-10-CM

## 2023-05-17 ENCOUNTER — Other Ambulatory Visit: Payer: Self-pay | Admitting: Family Medicine

## 2023-05-17 DIAGNOSIS — E119 Type 2 diabetes mellitus without complications: Secondary | ICD-10-CM

## 2023-06-09 ENCOUNTER — Ambulatory Visit: Admitting: Internal Medicine

## 2023-06-21 ENCOUNTER — Encounter: Payer: Self-pay | Admitting: Family Medicine

## 2023-06-21 ENCOUNTER — Ambulatory Visit (INDEPENDENT_AMBULATORY_CARE_PROVIDER_SITE_OTHER): Admitting: Family Medicine

## 2023-06-21 VITALS — BP 143/77 | HR 89 | Temp 97.4°F | Ht 69.5 in | Wt 209.6 lb

## 2023-06-21 DIAGNOSIS — S30861A Insect bite (nonvenomous) of abdominal wall, initial encounter: Secondary | ICD-10-CM

## 2023-06-21 DIAGNOSIS — E119 Type 2 diabetes mellitus without complications: Secondary | ICD-10-CM

## 2023-06-21 DIAGNOSIS — L03111 Cellulitis of right axilla: Secondary | ICD-10-CM | POA: Diagnosis not present

## 2023-06-21 DIAGNOSIS — W57XXXA Bitten or stung by nonvenomous insect and other nonvenomous arthropods, initial encounter: Secondary | ICD-10-CM | POA: Insufficient documentation

## 2023-06-21 MED ORDER — DOXYCYCLINE HYCLATE 100 MG PO TABS: 100.0000 mg | ORAL_TABLET | Freq: Two times a day (BID) | ORAL | 0 refills | Status: AC

## 2023-06-21 NOTE — Progress Notes (Signed)
 Assessment & Plan   Assessment/Plan:    Assessment & Plan Recurrent axillary cyst Recurrent cyst in the right axilla, previously excised in November. Reports irritation and mild erythema along the surgical scar, without drainage or tenderness. Concern for recurrence possibly related to previous shoulder surgery. Given diabetes, heightened risk for MRSA infection warrants prophylactic antibiotics. - Refer to Dr. Aldon Hung for surgical evaluation and possible re-excision. - Prescribe doxycycline  100 mg twice daily for 7 days for MRSA prophylaxis. - Advise monitoring for changes and reporting new symptoms.  Tick bite Healing tick bite at the left waistline, left of the midline. No drainage or signs of infection. Reports previous tick bites and associated illness. Preventive measures discussed to reduce future risk. - Advise monitoring for signs of infection or illness. - Discuss use of DEET and permethrin-treated clothing for prevention.      There are no discontinued medications.  Return if symptoms worsen or fail to improve.        Subjective:   Encounter date: 06/21/2023  Shaun Velasquez is a 63 y.o. male who has S/P shoulder replacement; Perennial and seasonal allergic rhinitis; Hyperlipidemia; Hypothyroidism; Type 2 diabetes mellitus without complication, without long-term current use of insulin  (HCC); Overweight (BMI 25.0-29.9); Primary male hypogonadism; Psoriasis; Seborrhea; Cellulitis of right axilla; and Tick bite of abdominal wall on their problem list..   He  has a past medical history of Allergy, Anxiety, Arthritis, Collapsed lung, Diabetes mellitus without complication (HCC), Eczema, GERD (gastroesophageal reflux disease), Heart murmur, History of chickenpox, Hyperlipemia, Hypothyroidism, PONV (postoperative nausea and vomiting), Psoriasis, and Uncontrolled diabetes mellitus type 2 without complications (05/19/2015).Zyla Dascenzo Aas   He presents with chief complaint of Cyst  (Under/side of right arm pit ) .   Discussed the use of AI scribe software for clinical note transcription with the patient, who gave verbal consent to proceed.  History of Present Illness Shaun Velasquez is a 63 year old male with diabetes who presents with a recurrent cyst under his right arm.  He has a recurrent cyst under his right arm, previously excised in November. Initially thought to be related to a tendon issue in his shoulder, it developed into a large cyst requiring surgical removal. The excision was significant, measuring approximately three inches, and required anesthesia. Pathology from the previous excision indicated benign skin with dermal acute and chronic inflammation, but no infection was found.  Currently, the cyst is becoming irritating. No pain, drainage, or fever is associated with it. He is concerned about recurrence and potential infection, especially given his history of diabetes. He is not taking any anti-inflammatories and has not experienced any discharge or fever related to the cyst.  He mentions a recent tick bite, which occurred last week, but clarifies that it was not located under the arm. The tick bite was at the waistline, left of the midline, and is healing without drainage. He has a history of tick bites causing significant illness in the past.  No fever, discharge, or pain associated with the cyst. Reports irritation and mild erythema at the site of the previous surgical scar.     ROS  Past Surgical History:  Procedure Laterality Date   CHEST TUBE INSERTION     pt had collapsed lung   COLONOSCOPY  2011   on [address], not sure name of the practice(normal)   COLONOSCOPY W/ POLYPECTOMY     CYST REMOVAL HAND     arm; 2024   ELBOW SURGERY Right 2009   EXCISION OF  KELOID Right 11/27/2022   Procedure: EXCISION OF SUBCUTANEOUS CYST RIGHT UPPER ARM;  Surgeon: Adalberto Acton, MD;  Location: [facility];  Service: General;  Laterality: Right;   INCISION  AND DRAINAGE PERIRECTAL ABSCESS     REVERSE SHOULDER ARTHROPLASTY Right 12/27/2014   Procedure: RIGHT REVERSE SHOULDER ARTHROPLASTY;  Surgeon: Ellard Gunning, MD;  Location: [facility];  Service: Orthopedics;  Laterality: Right;   ROTATOR CUFF REPAIR Right 2011   SHOULDER ARTHROSCOPY W/ LABRAL REPAIR Right    SHOULDER ARTHROSCOPY WITH ROTATOR CUFF REPAIR Left 2003   SHOULDER ARTHROSCOPY WITH SUBACROMIAL DECOMPRESSION Left    WISDOM TOOTH EXTRACTION      Outpatient Medications Prior to Visit  Medication Sig Dispense Refill   albuterol  (VENTOLIN  HFA) 108 (90 Base) MCG/ACT inhaler Inhale 2 puffs into the lungs every 6 (six) hours as needed for wheezing or shortness of breath. 8 g 2   fluticasone  (FLONASE ) 50 MCG/ACT nasal spray Place 2 sprays into both nostrils daily as needed for allergies. 11.1 mL 11   glipiZIDE  (GLUCOTROL ) 5 MG tablet TAKE 1 TABLET(5 MG) BY MOUTH DAILY BEFORE BREAKFAST 90 tablet 3   glucose blood test strip Test fasting blood sugars once a day 100 each 3   JARDIANCE  25 MG TABS tablet TAKE 1 TABLET(25 MG) BY MOUTH DAILY 90 tablet 3   levothyroxine  (SYNTHROID ) 150 MCG tablet Take 1 tablet (150 mcg total) by mouth daily before breakfast. 90 tablet 3   metFORMIN  (GLUCOPHAGE ) 500 MG tablet TAKE 2 TABLETS(1000 MG) BY MOUTH TWICE DAILY WITH A MEAL 360 tablet 3   montelukast  (SINGULAIR ) 10 MG tablet TAKE 1 TABLET BY MOUTH EVERYDAY AT BEDTIME (Patient taking differently: Take 10 mg by mouth daily as needed (allergies).) 30 tablet 5   Multiple Vitamin (MULTIVITAMIN WITH MINERALS) TABS tablet Take 1 tablet by mouth daily.     pioglitazone  (ACTOS ) 30 MG tablet Take 1 tablet (30 mg total) by mouth daily. 90 tablet 3   rosuvastatin  (CRESTOR ) 20 MG tablet TAKE 1 TABLET(20 MG) BY MOUTH DAILY 90 tablet 3   SKYRIZI PEN 150 MG/ML SOAJ Inject 150 mg into the skin every 3 (three) months.     tamsulosin (FLOMAX) 0.4 MG CAPS capsule Take 0.4 mg by mouth daily.     testosterone  cypionate (DEPOTESTOSTERONE  CYPIONATE) 200 MG/ML injection Inject 100 mg into the muscle once a week.     No facility-administered medications prior to visit.    Family History  Problem Relation Age of Onset   Allergic rhinitis Mother    Lung cancer Mother    Asthma Sister    Cancer Sister        Skin   Colon cancer Neg Hx    Colon polyps Neg Hx    Esophageal cancer Neg Hx    Stomach cancer Neg Hx    Rectal cancer Neg Hx     Social History   Socioeconomic History   Marital status: Married    Spouse name: Not on file   Number of children: 2   Years of education: Not on file   Highest education level: Not on file  Occupational History   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  Tobacco Use   Smoking status: Former    Current packs/day: 0.00    Types: Cigarettes    Start date: 1981    Quit date: 1996    Years since quitting: 29.4    Passive exposure: Never   Smokeless tobacco: Never  Vaping Use   Vaping status: Never Used  Substance and Sexual Activity   Alcohol use: No   Drug use: No   Sexual activity: Yes  Other Topics Concern   Not on file  Social History Narrative   Not on file   Social Drivers of Health   Financial Resource Strain: Not on file  Food Insecurity: Not on file  Transportation Needs: Not on file  Physical Activity: Not on file  Stress: Not on file  Social Connections: Not on file  Intimate Partner Violence: Not on file                                                                                                  Objective:  Physical Exam: BP (!) 143/77 (BP Location: Left Arm, Patient Position: Sitting, Cuff Size: Normal)   Pulse 89   Temp (!) 97.4 F (36.3 C) (Temporal)   Ht 5' 9.5" (1.765 m)   Wt 209 lb 9.6 oz (95.1 kg)   SpO2 99%   BMI 30.51 kg/m    Physical Exam GENERAL: Alert, cooperative, well developed, no acute distress. HEENT: Normocephalic, normal oropharynx, moist mucous membranes. CHEST: Clear to  auscultation bilaterally, no wheezes, rhonchi, or crackles. CARDIOVASCULAR: Normal heart rate and rhythm, S1 and S2 normal without murmurs. ABDOMEN: Soft, non-tender, non-distended, without organomegaly, normal bowel sounds. EXTREMITIES: No cyanosis or edema. NEUROLOGICAL: Cranial nerves grossly intact, moves all extremities without gross motor or sensory deficit. SKIN: Surgical scar on right axilla and right upper arm posterior, approximately six inches, with mild erythema along the border. Healing tick bite left waistline, left of midline, no drainage.   Physical Exam  No results found.  No results found for this or any previous visit (from the past 2160 hours).      Carnell Christian, MD, MS

## 2023-06-21 NOTE — Addendum Note (Signed)
 Addended by: Kasandra Pain B on: 06/21/2023 05:21 PM   Modules accepted: Orders

## 2023-06-22 ENCOUNTER — Ambulatory Visit: Admitting: Family Medicine

## 2023-07-01 ENCOUNTER — Encounter: Payer: Self-pay | Admitting: Family Medicine

## 2023-07-01 DIAGNOSIS — L03111 Cellulitis of right axilla: Secondary | ICD-10-CM

## 2023-07-02 MED ORDER — DOXYCYCLINE MONOHYDRATE 100 MG PO TABS
100.0000 mg | ORAL_TABLET | Freq: Two times a day (BID) | ORAL | 0 refills | Status: AC
Start: 2023-07-02 — End: 2023-07-09

## 2023-07-05 ENCOUNTER — Ambulatory Visit (INDEPENDENT_AMBULATORY_CARE_PROVIDER_SITE_OTHER): Admitting: Family Medicine

## 2023-07-05 ENCOUNTER — Ambulatory Visit: Payer: Self-pay | Admitting: Family Medicine

## 2023-07-05 VITALS — BP 118/74 | HR 77 | Temp 97.1°F | Ht 69.5 in | Wt 210.0 lb

## 2023-07-05 DIAGNOSIS — E119 Type 2 diabetes mellitus without complications: Secondary | ICD-10-CM | POA: Diagnosis not present

## 2023-07-05 DIAGNOSIS — G4762 Sleep related leg cramps: Secondary | ICD-10-CM | POA: Diagnosis not present

## 2023-07-05 DIAGNOSIS — E039 Hypothyroidism, unspecified: Secondary | ICD-10-CM | POA: Diagnosis not present

## 2023-07-05 DIAGNOSIS — Z7984 Long term (current) use of oral hypoglycemic drugs: Secondary | ICD-10-CM

## 2023-07-05 DIAGNOSIS — M19041 Primary osteoarthritis, right hand: Secondary | ICD-10-CM | POA: Diagnosis not present

## 2023-07-05 DIAGNOSIS — E785 Hyperlipidemia, unspecified: Secondary | ICD-10-CM

## 2023-07-05 LAB — VITAMIN B12: Vitamin B-12: 261 pg/mL (ref 211–911)

## 2023-07-05 LAB — TSH: TSH: 0.19 u[IU]/mL — ABNORMAL LOW (ref 0.35–5.50)

## 2023-07-05 LAB — MAGNESIUM: Magnesium: 2 mg/dL (ref 1.5–2.5)

## 2023-07-05 LAB — GLUCOSE, RANDOM: Glucose, Bld: 124 mg/dL — ABNORMAL HIGH (ref 70–99)

## 2023-07-05 LAB — HEMOGLOBIN A1C: Hgb A1c MFr Bld: 8.3 % — ABNORMAL HIGH (ref 4.6–6.5)

## 2023-07-05 MED ORDER — TIRZEPATIDE 2.5 MG/0.5ML ~~LOC~~ SOAJ: 2.5000 mg | SUBCUTANEOUS | 0 refills | Status: DC

## 2023-07-05 MED ORDER — LEVOTHYROXINE SODIUM 137 MCG PO TABS: 137.0000 ug | ORAL_TABLET | Freq: Every day | ORAL | 3 refills | Status: AC

## 2023-07-05 NOTE — Progress Notes (Addendum)
 [facility] [address] [place] [address] Dept: [phone] Dept Fax: [phone]  Chronic Care Office Visit  Subjective:    Patient ID: Shaun Velasquez, male    DOB: 06/19/1960, 63 y.o..   MRN: [id number]  Chief Complaint  Patient presents with   Hand Pain    C/o having RT middle finger pain x 1 month.  Has taken Ibuprofen.    History of Present Illness:  Patient is in today for reassessment of chronic medical issues.  Mr. Shaun Velasquez notes that he has been having pain in the right 3rd PIP joint over the past month. He does not recall any specific injury to that joint. He does find certain activities with his hand does set off the pain. It has been swollen. He has tried applying heat and ice, with some mild improvement. He has used some intermittent naproxen.  Mr. Shaun Velasquez has a history of Type 2 diabetes. He is managed on metformin  500 mg two tablets bid, empagliflozin  (Jardiance ) 25 mg daily, glipizide  5 mg daily, and pioglitazone  30 mg daily.   Mr. Shaun Velasquez has noted some nighttime leg cramps. he feels he hydrates well during the day and he does take some supplemental magnesium .  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   Tick bite of abdominal wall 06/21/2023   Cellulitis of right axilla 10/22/2022   Psoriasis 06/24/2020   Seborrhea 06/24/2020   Primary male hypogonadism 05/22/2016   Perennial and seasonal allergic rhinitis 08/27/2015   Overweight (BMI 25.0-29.9) 05/30/2015   Hyperlipidemia 05/19/2015   Hypothyroidism 05/19/2015   Type 2 diabetes mellitus without complication, without long-term current use of insulin  (HCC) 05/19/2015   S/P shoulder replacement 12/27/2014   Past Surgical History:  Procedure Laterality Date   CHEST TUBE INSERTION     pt had collapsed lung   COLONOSCOPY  2011   on [address], not sure name of the  practice(normal)   COLONOSCOPY W/ POLYPECTOMY     CYST REMOVAL HAND     arm; 2024   ELBOW SURGERY Right 2009   EXCISION OF KELOID Right 11/27/2022   Procedure: EXCISION OF SUBCUTANEOUS CYST RIGHT UPPER ARM;  Surgeon: Shaun Velasquez LABOR, MD;  Location: [facility];  Service: General;  Laterality: Right;   INCISION AND DRAINAGE PERIRECTAL ABSCESS     REVERSE SHOULDER ARTHROPLASTY Right 12/27/2014   Procedure: RIGHT REVERSE SHOULDER ARTHROPLASTY;  Surgeon: Shaun Pointer, MD;  Location: [facility];  Service: Orthopedics;  Laterality: Right;   ROTATOR CUFF REPAIR Right 2011   SHOULDER ARTHROSCOPY W/ LABRAL REPAIR Right    SHOULDER ARTHROSCOPY WITH ROTATOR CUFF REPAIR Left 2003   SHOULDER ARTHROSCOPY WITH SUBACROMIAL DECOMPRESSION Left    WISDOM TOOTH EXTRACTION     Family History  Problem Relation Age of Onset   Allergic rhinitis Mother    Lung cancer Mother    Asthma Sister    Cancer Sister        Skin   Colon cancer Neg Hx    Colon polyps Neg Hx    Esophageal cancer Neg Hx    Stomach cancer Neg Hx    Rectal cancer Neg Hx    Outpatient Medications Prior to Visit  Medication Sig Dispense Refill   albuterol  (VENTOLIN  HFA) 108 (90 Base) MCG/ACT inhaler Inhale 2 puffs into the lungs every 6 (six) hours as needed for wheezing or shortness of breath. 8  g 2   doxycycline  (ADOXA) 100 MG tablet Take 1 tablet (100 mg total) by mouth 2 (two) times daily for 7 days. 14 tablet 0   fluticasone  (FLONASE ) 50 MCG/ACT nasal spray Place 2 sprays into both nostrils daily as needed for allergies. 11.1 mL 11   glipiZIDE  (GLUCOTROL ) 5 MG tablet TAKE 1 TABLET(5 MG) BY MOUTH DAILY BEFORE BREAKFAST 90 tablet 3   glucose blood test strip Test fasting blood sugars once a day 100 each 3   JARDIANCE  25 MG TABS tablet TAKE 1 TABLET(25 MG) BY MOUTH DAILY 90 tablet 3   levothyroxine  (SYNTHROID ) 150 MCG tablet Take 1 tablet (150 mcg total) by mouth daily before breakfast. 90 tablet 3   metFORMIN  (GLUCOPHAGE ) 500 MG tablet TAKE  2 TABLETS(1000 MG) BY MOUTH TWICE DAILY WITH A MEAL 360 tablet 3   montelukast  (SINGULAIR ) 10 MG tablet TAKE 1 TABLET BY MOUTH EVERYDAY AT BEDTIME (Patient taking differently: Take 10 mg by mouth daily as needed (allergies).) 30 tablet 5   Multiple Vitamin (MULTIVITAMIN WITH MINERALS) TABS tablet Take 1 tablet by mouth daily.     pioglitazone  (ACTOS ) 30 MG tablet Take 1 tablet (30 mg total) by mouth daily. 90 tablet 3   rosuvastatin  (CRESTOR ) 20 MG tablet TAKE 1 TABLET(20 MG) BY MOUTH DAILY 90 tablet 3   SKYRIZI PEN 150 MG/ML SOAJ Inject 150 mg into the skin every 3 (three) months.     tamsulosin (FLOMAX) 0.4 MG CAPS capsule Take 0.4 mg by mouth daily.     testosterone  cypionate (DEPOTESTOSTERONE CYPIONATE) 200 MG/ML injection Inject 100 mg into the muscle once a week.     No facility-administered medications prior to visit.   No Known Allergies Objective:   Today's Vitals   07/05/23 1048  BP: 118/74  Pulse: 77  Temp: (!) 97.1 F (36.2 C)  TempSrc: Temporal  SpO2: 97%  Weight: 210 lb (95.3 kg)  Height: 5' 9.5 (1.765 m)   Body mass index is 30.57 kg/m.   General: Well developed, well nourished. No acute distress. Extremities: There is mild swelling of the right 3rd PIP joint. No laxity noted. There is mild redness of the joint. Psych: Alert and oriented. Normal mood and affect.  Health Maintenance Due  Topic Date Due   HIV Screening  Never done   Zoster Vaccines- Shingrix (1 of 2) Never done   Pneumococcal Vaccine 70-22 Years old (2 of 2 - PCV) 10/09/2017   DTaP/Tdap/Td (2 - Td or Tdap) 06/29/2022   OPHTHALMOLOGY EXAM  12/12/2022   HEMOGLOBIN A1C  06/18/2023     Assessment & Plan:   Problem List Items Addressed This Visit       Endocrine   Hypothyroidism   I recommend we check his TSH level, as his last level was mildly low. Continue levothyroxine  150 mcg daily      Relevant Orders   TSH   Type 2 diabetes mellitus without complication, without long-term current  use of insulin  (HCC) - Primary   I will check an A1c today. Continue metformin  500 mg two tablets bid, empagliflozin  (Jardiance ) 25 mg daily, glipizide  5 mg daily, and pioglitazone  30 mg daily. If his A1c is elevated I will see if we can get him on a GLP-1 receptor agonist.      Relevant Orders   Glucose, random   Hemoglobin A1c   Vitamin B12     Musculoskeletal and Integument   Arthritis of finger of right hand   History and  exam consistent with OA. Recommend relative rest, heat with ROM, and a trial of naproxen 220 mg bid for 7 days.        Other   Hyperlipidemia   Lipids are at goal. Continue rosuvastatin  20 mg daily.      Nocturnal leg cramps   Discussed that this is a common issue for patients with diabetes. I recommend he hydrate well in the hot, humid weather and add electrolytes. He could consider using tonic water at bedtime. I will check electrolytes, magnesium , calcium , and a Vitamin B12 (in light of metformin  use).,      Relevant Orders   Vitamin B12   Magnesium     Return in about 3 months (around 10/05/2023) for Reassessment.   Garnette CHRISTELLA Simpler, MD

## 2023-07-05 NOTE — Assessment & Plan Note (Signed)
Lipids are at goal. Continue rosuvastatin 20 mg daily.

## 2023-07-05 NOTE — Assessment & Plan Note (Signed)
 Discussed that this is a common issue for patients with diabetes. I recommend he hydrate well in the hot, humid weather and add electrolytes. He could consider using tonic water at bedtime. I will check electrolytes, magnesium , calcium , and a Vitamin B12 (in light of metformin  use).,

## 2023-07-05 NOTE — Addendum Note (Signed)
 Addended by: THEDORA GARNETTE HERO on: 07/05/2023 11:51 AM   Modules accepted: Orders

## 2023-07-05 NOTE — Assessment & Plan Note (Signed)
 I will check an A1c today. Continue metformin  500 mg two tablets bid, empagliflozin  (Jardiance ) 25 mg daily, glipizide  5 mg daily, and pioglitazone  30 mg daily. If his A1c is elevated I will see if we can get him on a GLP-1 receptor agonist.

## 2023-07-05 NOTE — Assessment & Plan Note (Signed)
 History and exam consistent with OA. Recommend relative rest, heat with ROM, and a trial of naproxen 220 mg bid for 7 days.

## 2023-07-05 NOTE — Assessment & Plan Note (Signed)
 I recommend we check his TSH level, as his last level was mildly low. Continue levothyroxine  150 mcg daily

## 2023-07-05 NOTE — Patient Instructions (Signed)
 Use Aleve 220 mg twice a day for 7 days.

## 2023-07-07 ENCOUNTER — Other Ambulatory Visit (HOSPITAL_COMMUNITY): Payer: Self-pay

## 2023-07-07 ENCOUNTER — Telehealth: Payer: Self-pay

## 2023-07-07 ENCOUNTER — Other Ambulatory Visit: Payer: Self-pay | Admitting: Surgery

## 2023-07-07 DIAGNOSIS — Z09 Encounter for follow-up examination after completed treatment for conditions other than malignant neoplasm: Secondary | ICD-10-CM

## 2023-07-07 NOTE — Telephone Encounter (Signed)
 Pharmacy Patient Advocate Encounter   Received notification from CoverMyMeds that prior authorization for Mounjaro 2.5 is required/requested.   Insurance verification completed.   The patient is insured through [employer] .   Per test claim: PA required; PA submitted to above mentioned insurance via CoverMyMeds Key/confirmation #/EOC [id number] Status is pending

## 2023-07-07 NOTE — Telephone Encounter (Signed)
 Pharmacy Patient Advocate Encounter  Received notification from [employer] that Prior Authorization for Mounjaro 2.5 has been APPROVED from 07/07/23 to 07/06/24. Ran test claim, Copay is $25.00. This test claim was processed through [facility]- copay amounts may vary at other pharmacies due to pharmacy/plan contracts, or as the patient moves through the different stages of their insurance plan.   PA #/Case ID/Reference #: [id number]

## 2023-07-13 ENCOUNTER — Other Ambulatory Visit: Payer: Self-pay | Admitting: Orthopaedic Surgery

## 2023-07-13 DIAGNOSIS — G8929 Other chronic pain: Secondary | ICD-10-CM

## 2023-07-15 ENCOUNTER — Ambulatory Visit
Admission: RE | Admit: 2023-07-15 | Discharge: 2023-07-15 | Disposition: A | Discharge status: Home / Self Care | Source: Ambulatory Visit | Attending: Surgery | Admitting: Surgery

## 2023-07-15 DIAGNOSIS — Z09 Encounter for follow-up examination after completed treatment for conditions other than malignant neoplasm: Secondary | ICD-10-CM

## 2023-07-15 MED ORDER — GADOPICLENOL 0.5 MMOL/ML IV SOLN
10.0000 mL | Freq: Once | INTRAVENOUS | Status: AC | PRN
  Administered 2023-07-15: 10 mL via INTRAVENOUS

## 2023-07-19 ENCOUNTER — Other Ambulatory Visit: Payer: Self-pay | Admitting: Orthopaedic Surgery

## 2023-07-19 ENCOUNTER — Ambulatory Visit
Admission: RE | Admit: 2023-07-19 | Discharge: 2023-07-19 | Disposition: A | Discharge status: Home / Self Care | Source: Ambulatory Visit | Attending: Orthopaedic Surgery | Admitting: Orthopaedic Surgery

## 2023-07-19 DIAGNOSIS — G8929 Other chronic pain: Secondary | ICD-10-CM

## 2023-08-04 ENCOUNTER — Ambulatory Visit (INDEPENDENT_AMBULATORY_CARE_PROVIDER_SITE_OTHER): Admitting: Family Medicine

## 2023-08-04 VITALS — BP 118/74 | HR 68 | Temp 98.2°F | Ht 69.0 in | Wt 211.6 lb

## 2023-08-04 DIAGNOSIS — E119 Type 2 diabetes mellitus without complications: Secondary | ICD-10-CM | POA: Diagnosis not present

## 2023-08-04 DIAGNOSIS — L723 Sebaceous cyst: Secondary | ICD-10-CM

## 2023-08-04 DIAGNOSIS — Z7984 Long term (current) use of oral hypoglycemic drugs: Secondary | ICD-10-CM

## 2023-08-04 MED ORDER — TIRZEPATIDE 5 MG/0.5ML ~~LOC~~ SOAJ: 5.0000 mg | SUBCUTANEOUS | 3 refills | Status: AC

## 2023-08-04 NOTE — Progress Notes (Signed)
 [facility]IMARY CARE [facility] [address] [place] [address] Dept: [phone] Dept Fax: [phone]  Office Visit  Subjective:    Patient ID: Shaun Velasquez, male    DOB: 03-Sep-1960, 63 y.o..   MRN: [id number]  Chief Complaint  Patient presents with   Medical Management of Chronic Issues    Need refill on tirzepatide  (MOUNJARO ) 2.5 MG/0.5ML Pen.have had some low  blood sugars. Want to discuss medications        History of Present Illness:  Patient is in today to discuss several recent episodes of hypoglycemia. He was started about 1 month ago on tirzepatide  (Mounjaro ) 2.5 mg daily. He feels he is tolerating the medicine well. He reviewed steps he has taken to get his sugars back up. He notes his fasting sugars are now ~ 100, which he is pleased to see. He is in need of a right shoulder joint revision, but cannot move ahead with surgery until his A1c is under 8%.  Shaun Velasquez has had a history of a sebaceous cysts in his right axilla. He had surgery for this last December. He notes he has a new area of tenderness and skin thickening near his scar.   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   Past Surgical History:  Procedure Laterality Date   CHEST TUBE INSERTION     pt had collapsed lung   COLONOSCOPY  2011   on [address], not sure name of the practice(normal)    COLONOSCOPY W/ POLYPECTOMY     CYST REMOVAL HAND     arm; 2024   ELBOW SURGERY Right 2009   EXCISION OF KELOID Right 11/27/2022   Procedure: EXCISION OF SUBCUTANEOUS CYST RIGHT UPPER ARM;  Surgeon: Signe Mitzie LABOR, MD;  Location: [facility];  Service: General;  Laterality: Right;   INCISION AND DRAINAGE PERIRECTAL ABSCESS     REVERSE SHOULDER ARTHROPLASTY Right 12/27/2014   Procedure: RIGHT REVERSE SHOULDER ARTHROPLASTY;  Surgeon: Franky Pointer, MD;  Location: [facility];  Service: Orthopedics;  Laterality: Right;   ROTATOR CUFF REPAIR Right 2011   SHOULDER ARTHROSCOPY W/ LABRAL REPAIR Right    SHOULDER ARTHROSCOPY WITH ROTATOR CUFF REPAIR Left 2003   SHOULDER ARTHROSCOPY WITH SUBACROMIAL DECOMPRESSION Left    WISDOM TOOTH EXTRACTION     Family History  Problem Relation Age of Onset   Allergic rhinitis Mother    Lung cancer Mother    Asthma Sister    Cancer Sister        Skin   Colon cancer Neg Hx    Colon polyps Neg Hx    Esophageal cancer Neg Hx    Stomach cancer Neg Hx    Rectal cancer Neg Hx    Outpatient Medications Prior to Visit  Medication Sig Dispense Refill   albuterol  (VENTOLIN  HFA) 108 (90 Base) MCG/ACT inhaler Inhale 2 puffs into the lungs every 6 (six) hours as needed for wheezing or shortness of  breath. 8 g 2   fluticasone  (FLONASE ) 50 MCG/ACT nasal spray Place 2 sprays into both nostrils daily as needed for allergies. 11.1 mL 11   glucose blood test strip Test fasting blood sugars once a day 100 each 3   JARDIANCE  25 MG TABS tablet TAKE 1 TABLET(25 MG) BY MOUTH DAILY 90 tablet 3   levothyroxine  (SYNTHROID ) 137 MCG tablet Take 1 tablet (137 mcg total) by mouth daily before breakfast. 90 tablet 3   Multiple Vitamin (MULTIVITAMIN WITH MINERALS) TABS tablet Take 1 tablet by mouth daily.     pioglitazone  (ACTOS ) 30 MG tablet Take 1 tablet (30 mg total) by mouth daily. 90 tablet 3   rosuvastatin  (CRESTOR ) 20 MG tablet TAKE 1 TABLET(20 MG) BY MOUTH DAILY 90 tablet 3   SKYRIZI  PEN 150 MG/ML SOAJ Inject 150 mg into the skin every 3 (three) months.     tamsulosin (FLOMAX) 0.4 MG CAPS capsule Take 0.4 mg by mouth daily.     testosterone  cypionate (DEPOTESTOSTERONE CYPIONATE) 200 MG/ML injection Inject 100 mg into the muscle once a week.     glipiZIDE  (GLUCOTROL ) 5 MG tablet TAKE 1 TABLET(5 MG) BY MOUTH DAILY BEFORE BREAKFAST 90 tablet 3   tirzepatide  (MOUNJARO ) 2.5 MG/0.5ML Pen Inject 2.5 mg into the skin once a week. 2 mL 0   metFORMIN  (GLUCOPHAGE ) 500 MG tablet TAKE 2 TABLETS(1000 MG) BY MOUTH TWICE DAILY WITH A MEAL 360 tablet 3   montelukast  (SINGULAIR ) 10 MG tablet TAKE 1 TABLET BY MOUTH EVERYDAY AT BEDTIME (Patient taking differently: Take 10 mg by mouth daily as needed (allergies).) 30 tablet 5   No facility-administered medications prior to visit.   No Known Allergies   Objective:   Today's Vitals   08/04/23 1600  BP: 118/74  Pulse: 68  Temp: 98.2 F (36.8 C)  TempSrc: Temporal  SpO2: 96%  Weight: 211 lb 9.6 oz (96 kg)  Height: 5' 9 (1.753 m)   Body mass index is 31.25 kg/m.   General: Well developed, well nourished. No acute distress. Extremities: Well healed scar in the right axilla.  There is a small area of redness near the lower edge of ths   scar. No fluctuance seen. Psych: Alert and oriented. Normal mood and affect.  Health Maintenance Due  Topic Date Due   HIV Screening  Never done   Diabetic kidney evaluation - Urine ACR  Never done   Zoster Vaccines- Shingrix (1 of 2) Never done   Pneumococcal Vaccine 89-69 Years old (2 of 2 - PCV) 10/09/2017   DTaP/Tdap/Td (2 - Td or Tdap) 06/29/2022   COVID-19 Vaccine (4 - 2024-25 season) 09/13/2022   Assessment & Plan:   Problem List Items Addressed This Visit       Endocrine   Type 2 diabetes mellitus without complication, without long-term current use of insulin  (HCC) - Primary   Continue metformin  500 mg two tablets bid, empagliflozin  (Jardiance ) 25 mg daily, and pioglitazone  30 mg  daily.I will increase the dose of his tirzepatide  (Mounjaro ) to 5 mg daily. I recommend he stop his glipizide , which is most likely to be contributing to his hypoglycemia.      Relevant Medications   tirzepatide  (MOUNJARO ) 5 MG/0.5ML Pen   Other Visit Diagnoses       Sebaceous cyst of axilla       This may be a newly developing cyst. I recommend hot packing this and monitoring for now.       Return in about  4 weeks (around 09/01/2023) for Reassessment, pre-op.   Garnette CHRISTELLA Simpler, MD

## 2023-08-04 NOTE — Assessment & Plan Note (Signed)
 Continue metformin  500 mg two tablets bid, empagliflozin  (Jardiance ) 25 mg daily, and pioglitazone  30 mg daily.I will increase the dose of his tirzepatide  (Mounjaro ) to 5 mg daily. I recommend he stop his glipizide , which is most likely to be contributing to his hypoglycemia.

## 2023-08-05 ENCOUNTER — Encounter: Payer: Self-pay | Admitting: Family Medicine

## 2023-08-05 DIAGNOSIS — L239 Allergic contact dermatitis, unspecified cause: Secondary | ICD-10-CM

## 2023-08-06 MED ORDER — METHYLPREDNISOLONE 4 MG PO TBPK: ORAL_TABLET | ORAL | 0 refills | Status: DC

## 2023-08-06 NOTE — Telephone Encounter (Signed)
 Noted. Patient saw Dr. Thedora on 08/04/23.

## 2023-08-06 NOTE — Addendum Note (Signed)
 Addended by: THEDORA GARNETTE HERO on: 08/06/2023 08:09 AM   Modules accepted: Orders

## 2023-08-13 ENCOUNTER — Encounter: Payer: Self-pay | Admitting: Family Medicine

## 2023-08-13 DIAGNOSIS — L723 Sebaceous cyst: Secondary | ICD-10-CM

## 2023-08-17 MED ORDER — DOXYCYCLINE HYCLATE 100 MG PO TABS: 100.0000 mg | ORAL_TABLET | Freq: Two times a day (BID) | ORAL | 0 refills | Status: DC

## 2023-08-17 NOTE — Addendum Note (Signed)
 Addended by: THEDORA GARNETTE HERO on: 08/17/2023 04:58 PM   Modules accepted: Orders

## 2023-08-23 ENCOUNTER — Other Ambulatory Visit: Payer: Self-pay | Admitting: Medical Genetics

## 2023-08-30 ENCOUNTER — Encounter: Payer: Self-pay | Admitting: Family Medicine

## 2023-08-30 ENCOUNTER — Ambulatory Visit: Admitting: Family Medicine

## 2023-08-30 VITALS — BP 122/70 | HR 76 | Temp 98.0°F | Ht 69.0 in | Wt 205.2 lb

## 2023-08-30 DIAGNOSIS — L723 Sebaceous cyst: Secondary | ICD-10-CM | POA: Diagnosis not present

## 2023-08-30 DIAGNOSIS — E119 Type 2 diabetes mellitus without complications: Secondary | ICD-10-CM | POA: Diagnosis not present

## 2023-08-30 DIAGNOSIS — Z7984 Long term (current) use of oral hypoglycemic drugs: Secondary | ICD-10-CM | POA: Diagnosis not present

## 2023-08-30 NOTE — Assessment & Plan Note (Signed)
 Continue metformin  500 mg two tablets bid, empagliflozin  (Jardiance ) 25 mg daily, pioglitazone  30 mg daily and tirzepatide  (Mounjaro ) 5 mg weekly. I will reassess his A1c today to see if he is meeting goals to move ahead with his shoulder surgery.

## 2023-08-30 NOTE — Progress Notes (Signed)
 [facility]ARE [facility] [address] [place] [address] Dept: [phone] Dept Fax: [phone]  Chronic Care Office Visit  Subjective:    Patient ID: Shaun Velasquez, male    DOB: 10-20-1960, 63 y.o..   MRN: [id number]  Chief Complaint  Patient presents with   Depression    4 week f/u DM.  Sugars average 95-105 at home. Up coming surgery (pre-op).     History of Present Illness:  Patient is in today for reassessment of chronic medical issues.  Shaun Velasquez has a history of Type 2 diabetes. He is managed on metformin  500 mg two tablets bid, empagliflozin  (Jardiance ) 25 mg daily, pioglitazone  30 mg daily, and tirzepatide  (Mounjaro ) 5 mg weekly. Last month, we stopped his glipizide , as he had started to have episodes of hypoglycemia. He  is working to get his A1c < 8%, as he needs a right shoulder joint revision. He is exercising more. He feels the tirzepatide  has helped him curb his portion sizes.  Shaun Velasquez has had a history of a sebaceous cysts in his right axilla. He had surgery for this last December. Recently he had a new area of tenderness and skin thickening near his scar. We treated him with a course of doxycycline  and he feels this is doing better.   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   Past Surgical History:  Procedure Laterality Date   CHEST TUBE INSERTION      pt had collapsed lung   COLONOSCOPY  2011   on [address], not sure name of the practice(normal)   COLONOSCOPY W/ POLYPECTOMY     CYST REMOVAL HAND     arm; 2024   ELBOW SURGERY Right 2009   EXCISION OF KELOID Right 11/27/2022   Procedure: EXCISION OF SUBCUTANEOUS CYST RIGHT UPPER ARM;  Surgeon: Signe Mitzie LABOR, MD;  Location: [facility];  Service: General;  Laterality: Right;   INCISION AND DRAINAGE PERIRECTAL ABSCESS     REVERSE SHOULDER ARTHROPLASTY Right 12/27/2014   Procedure: RIGHT REVERSE SHOULDER ARTHROPLASTY;  Surgeon: Franky Pointer, MD;  Location: [facility];  Service: Orthopedics;  Laterality: Right;   ROTATOR CUFF REPAIR Right 2011   SHOULDER ARTHROSCOPY W/ LABRAL REPAIR Right    SHOULDER ARTHROSCOPY WITH ROTATOR CUFF REPAIR Left 2003   SHOULDER ARTHROSCOPY WITH SUBACROMIAL DECOMPRESSION Left    WISDOM TOOTH EXTRACTION     Family History  Problem Relation Age of Onset   Allergic rhinitis Mother    Lung cancer Mother    Asthma Sister    Cancer Sister        Skin   Colon cancer Neg Hx    Colon polyps Neg Hx    Esophageal cancer Neg Hx    Stomach cancer Neg Hx    Rectal cancer Neg Hx    Outpatient Medications Prior to Visit  Medication Sig Dispense Refill   albuterol  (VENTOLIN  HFA) 108 (90 Base) MCG/ACT inhaler  Inhale 2 puffs into the lungs every 6 (six) hours as needed for wheezing or shortness of breath. 8 g 2   doxycycline  (VIBRA -TABS) 100 MG tablet Take 1 tablet (100 mg total) by mouth 2 (two) times daily. 20 tablet 0   fluticasone  (FLONASE ) 50 MCG/ACT nasal spray Place 2 sprays into both nostrils daily as needed for allergies. 11.1 mL 11   glucose blood test strip Test fasting blood sugars once a day 100 each 3   JARDIANCE  25 MG TABS tablet TAKE 1 TABLET(25 MG) BY MOUTH DAILY 90 tablet 3   levothyroxine  (SYNTHROID ) 137 MCG tablet Take 1 tablet (137 mcg total) by mouth daily before breakfast. 90 tablet 3   metFORMIN  (GLUCOPHAGE ) 500 MG tablet TAKE 2 TABLETS(1000  MG) BY MOUTH TWICE DAILY WITH A MEAL 360 tablet 3   montelukast  (SINGULAIR ) 10 MG tablet TAKE 1 TABLET BY MOUTH EVERYDAY AT BEDTIME (Patient taking differently: Take 10 mg by mouth daily as needed (allergies).) 30 tablet 5   Multiple Vitamin (MULTIVITAMIN WITH MINERALS) TABS tablet Take 1 tablet by mouth daily.     pioglitazone  (ACTOS ) 30 MG tablet Take 1 tablet (30 mg total) by mouth daily. 90 tablet 3   rosuvastatin  (CRESTOR ) 20 MG tablet TAKE 1 TABLET(20 MG) BY MOUTH DAILY 90 tablet 3   SKYRIZI PEN 150 MG/ML SOAJ Inject 150 mg into the skin every 3 (three) months.     tamsulosin (FLOMAX) 0.4 MG CAPS capsule Take 0.4 mg by mouth daily.     testosterone  cypionate (DEPOTESTOSTERONE CYPIONATE) 200 MG/ML injection Inject 100 mg into the muscle once a week.     tirzepatide  (MOUNJARO ) 5 MG/0.5ML Pen Inject 5 mg into the skin once a week. 6 mL 3   methylPREDNISolone  (MEDROL  DOSEPAK) 4 MG TBPK tablet Take per package instructions. 21 tablet 0   No facility-administered medications prior to visit.   No Known Allergies Objective:   Today's Vitals   08/30/23 1539  BP: 122/70  Pulse: 76  Temp: 98 F (36.7 C)  TempSrc: Temporal  SpO2: 98%  Weight: 205 lb 3.2 oz (93.1 kg)  Height: 5' 9 (1.753 m)   Body mass index is 30.3 kg/m.   General: Well developed, well nourished. No acute distress. Extremities: The scar in the right axilla appears less red at this point. No fluctuance noted. No drainage   seen. Psych: Alert and oriented. Normal mood and affect.  Health Maintenance Due  Topic Date Due   HIV Screening  Never done   Diabetic kidney evaluation - Urine ACR  Never done   Zoster Vaccines- Shingrix (1 of 2) Never done   Pneumococcal Vaccine: 50+ Years (2 of 2 - PCV) 10/09/2017   DTaP/Tdap/Td (2 - Td or Tdap) 06/29/2022     Assessment & Plan:   Problem List Items Addressed This Visit       Endocrine   Type 2 diabetes mellitus without complication, without long-term current use of  insulin  (HCC) - Primary   Continue metformin  500 mg two tablets bid, empagliflozin  (Jardiance ) 25 mg daily, pioglitazone  30 mg daily and tirzepatide  (Mounjaro ) 5 mg weekly. I will reassess his A1c today to see if he is meeting goals to move ahead with his shoulder surgery.      Relevant Orders   Hemoglobin A1c   Other Visit Diagnoses       Sebaceous cyst of axilla       Infection appears to have resolved. Recommend he do massage of his surgical  scar.       Return in about 3 months (around 11/30/2023) for Reassessment.   Garnette CHRISTELLA Simpler, MD

## 2023-08-31 ENCOUNTER — Ambulatory Visit: Payer: Self-pay | Admitting: Family Medicine

## 2023-08-31 LAB — HEMOGLOBIN A1C: Hgb A1c MFr Bld: 7.1 % — ABNORMAL HIGH (ref 4.6–6.5)

## 2023-09-16 ENCOUNTER — Other Ambulatory Visit

## 2023-09-16 DIAGNOSIS — Z006 Encounter for examination for normal comparison and control in clinical research program: Secondary | ICD-10-CM

## 2023-09-20 ENCOUNTER — Ambulatory Visit (INDEPENDENT_AMBULATORY_CARE_PROVIDER_SITE_OTHER): Admitting: Nurse Practitioner

## 2023-09-20 ENCOUNTER — Ambulatory Visit: Payer: Self-pay

## 2023-09-20 ENCOUNTER — Encounter: Payer: Self-pay | Admitting: Nurse Practitioner

## 2023-09-20 VITALS — BP 122/68 | HR 84 | Temp 98.2°F | Ht 69.5 in | Wt 199.6 lb

## 2023-09-20 DIAGNOSIS — L03221 Cellulitis of neck: Secondary | ICD-10-CM

## 2023-09-20 MED ORDER — DOXYCYCLINE HYCLATE 100 MG PO TABS: 100.0000 mg | ORAL_TABLET | Freq: Two times a day (BID) | ORAL | 0 refills | Status: DC

## 2023-09-20 NOTE — Progress Notes (Addendum)
 Acute Office Visit  Subjective:    Patient ID: Shaun Velasquez, male    DOB: 1960/02/14, 63 y.o.   MRN: [id number]  Chief Complaint  Patient presents with   Mass    Back of neck red and painful started out as a pimp/bump and progressed into hte lump/knot    Rash This is a new problem. The current episode started in the past 7 days. The problem is unchanged. The affected locations include the neck. The rash is characterized by pain, redness and swelling. He was exposed to nothing. Pertinent negatives include no fatigue or fever. Past treatments include nothing.  Completed doxycycline  x10days 4weeks ago for an axillary sebaceous cyst  Outpatient Medications Prior to Visit  Medication Sig   albuterol  (VENTOLIN  HFA) 108 (90 Base) MCG/ACT inhaler Inhale 2 puffs into the lungs every 6 (six) hours as needed for wheezing or shortness of breath.   fluticasone  (FLONASE ) 50 MCG/ACT nasal spray Place 2 sprays into both nostrils daily as needed for allergies.   glucose blood test strip Test fasting blood sugars once a day   JARDIANCE  25 MG TABS tablet TAKE 1 TABLET(25 MG) BY MOUTH DAILY   levothyroxine  (SYNTHROID ) 137 MCG tablet Take 1 tablet (137 mcg total) by mouth daily before breakfast.   metFORMIN  (GLUCOPHAGE ) 500 MG tablet TAKE 2 TABLETS(1000 MG) BY MOUTH TWICE DAILY WITH A MEAL   montelukast  (SINGULAIR ) 10 MG tablet TAKE 1 TABLET BY MOUTH EVERYDAY AT BEDTIME (Patient taking differently: Take 10 mg by mouth daily as needed (allergies).)   Multiple Vitamin (MULTIVITAMIN WITH MINERALS) TABS tablet Take 1 tablet by mouth daily.   pioglitazone  (ACTOS ) 30 MG tablet Take 1 tablet (30 mg total) by mouth daily.   rosuvastatin  (CRESTOR ) 20 MG tablet TAKE 1 TABLET(20 MG) BY MOUTH DAILY   SKYRIZI PEN 150 MG/ML SOAJ Inject 150 mg into the skin every 3 (three) months.   tamsulosin (FLOMAX) 0.4 MG CAPS capsule Take 0.4 mg by mouth daily.   testosterone  cypionate (DEPOTESTOSTERONE CYPIONATE) 200 MG/ML injection  Inject 100 mg into the muscle once a week.   tirzepatide  (MOUNJARO ) 5 MG/0.5ML Pen Inject 5 mg into the skin once a week.   [DISCONTINUED] doxycycline  (VIBRA -TABS) 100 MG tablet Take 1 tablet (100 mg total) by mouth 2 (two) times daily.   No facility-administered medications prior to visit.    Reviewed past medical and social history.  Review of Systems  Constitutional:  Negative for fatigue and fever.  Skin:  Positive for rash.   Per HPI     Objective:    Physical Exam Vitals and nursing note reviewed.  Neck:      Comments: Area of induration and erythema on posterior aspect of neck, mild tenderness. Cardiovascular:     Rate and Rhythm: Normal rate.     Pulses: Normal pulses.  Pulmonary:     Effort: Pulmonary effort is normal.  Lymphadenopathy:     Cervical: No cervical adenopathy.  Neurological:     Mental Status: He is alert.    BP 122/68 (BP Location: Left Arm, Patient Position: Sitting, Cuff Size: Large)   Pulse 84   Temp 98.2 F (36.8 C) (Oral)   Ht 5' 9.5 (1.765 m)   Wt 199 lb 9.6 oz (90.5 kg)   SpO2 97%   BMI 29.05 kg/m    No results found for any visits on 09/20/23.     Assessment & Plan:   Problem List Items Addressed This Visit   None  Visit Diagnoses       Cellulitis of neck    -  Primary   Relevant Medications   doxycycline  (VIBRA -TABS) 100 MG tablet      Meds ordered this encounter  Medications   doxycycline  (VIBRA -TABS) 100 MG tablet    Sig: Take 1 tablet (100 mg total) by mouth 2 (two) times daily.    Dispense:  14 tablet    Refill:  0    Supervising Provider:   BERNETA FALLOW ALFRED [[id number]]  Apply warm compress 2-3x/day Call office if no improvement in 7days  Return if symptoms worsen or fail to improve.   Roselie Mood, NP

## 2023-09-20 NOTE — Telephone Encounter (Signed)
 FYI Only or Action Required?: FYI only for provider.  Patient was last seen in primary care on 08/30/2023 by Thedora Garnette HERO, MD.  Called Nurse Triage reporting Mass on neck. Warm to touch. LGF. Painful  Symptoms began several days ago.  Interventions attempted: Nothing.  Symptoms are: gradually worsening.  Triage Disposition: See HCP Within 4 Hours (Or PCP Triage)  Patient/caregiver understands and will follow disposition?: Yes              Copied from CRM #[id number]. Topic: Clinical - Red Word Triage >> Sep 20, 2023  7:59 AM Pinkey ORN wrote: Red Word that prompted transfer to Nurse Triage: Possible Allergic Reaction / Bug Bite >> Sep 20, 2023  8:01 AM Pinkey ORN wrote: Patient states he has this bump on the back of his neck that popped up over night. Patient states it's warm to touch and is causing extreme pain, as well as growing in size and extremely swollen.  Reason for Disposition  [1] Swelling is painful to touch AND [2] fever  Answer Assessment - Initial Assessment Questions 1. APPEARANCE of SWELLING: What does it look like?     Large area on back of neck  3. LOCATION: Where is the swelling located?     Back of neck 4. ONSET: When did the swelling start?     Saturday  6. PAIN: Is there any pain? If Yes, ask: How bad is the pain? (Scale 1-10; or mild, moderate, severe)       yes 7. ITCH: Does it itch? If Yes, ask: How bad is the itch?      no 8. CAUSE: What do you think caused the swelling?     Bug bite or infection 9 OTHER SYMPTOMS: Do you have any other symptoms? (e.g., fever)    LGF  Protocols used: Skin Lump or Localized Swelling-A-AH

## 2023-09-20 NOTE — Patient Instructions (Signed)
 Apply warm compress 2-3x/day Call office if no improvement in 7days

## 2023-09-20 NOTE — Telephone Encounter (Signed)
 OV was scheduled for today with Dr. Thedora MD

## 2023-09-22 ENCOUNTER — Ambulatory Visit: Payer: Self-pay

## 2023-09-22 ENCOUNTER — Encounter: Payer: Self-pay | Admitting: Nurse Practitioner

## 2023-09-22 DIAGNOSIS — L03221 Cellulitis of neck: Secondary | ICD-10-CM

## 2023-09-22 MED ORDER — CLINDAMYCIN HCL 300 MG PO CAPS: 300.0000 mg | ORAL_CAPSULE | Freq: Three times a day (TID) | ORAL | 0 refills | Status: AC

## 2023-09-22 NOTE — Telephone Encounter (Signed)
 FYI Only or Action Required?: FYI only for provider.  Patient was last seen in primary care on 09/20/2023 by Nche, Roselie Rockford, NP.  Called Nurse Triage reporting Wound Infection.  Symptoms began several days ago.  Interventions attempted: Prescription medications: Doxycycline .  Symptoms are: rapidly worsening.  Triage Disposition: See HCP Within 4 Hours (Or PCP Triage)  Patient/caregiver understands and will follow disposition?: Yes   Reason for Disposition  [1] Taking antibiotic > 24 hours AND [2] red streak (or line) runs from area of infection    Worsening swelling and redness, hot to touch  Answer Assessment - Initial Assessment Questions Wife calling in, started Saturday evening, wife states its gotten worse since then. States he just does not feel good, unable to sleep. Advised to UC  1. SYMPTOM: What's the main symptom you're concerned about? (e.g., redness, swelling, pain, fever, weakness)     Swelling, redness  2. CELLULITIS LOCATION: Where is the cellulitis located? (e.g., hand, arm, foot, leg, face)     Behind neck  4. BETTER-SAME-WORSE: Are you getting better, staying the same, or getting worse compared to the day you started the antibiotics?      Worse  5. PAIN: Do you have any pain?  If Yes, ask: How bad is the pain?  (e.g., Scale 0-10; mild, moderate, or severe)     6/10  6. FEVER: Do you have a fever? If Yes, ask: What is it, how was it measured and when did it start?     No  7. OTHER SYMPTOMS: Do you have any other symptoms? (e.g., pus coming from a wound, red streaks, weakness)     Really hot, currently icing it  9. ANTIBIOTIC NAME: What antibiotic(s) are you taking?  How many times per day? (Be sure the patient is taking the antibiotic as directed).      Doxycycline   10. ANTIBIOTIC DATE: When was the antibiotic started?       9/8  Protocols used: Cellulitis on Antibiotic Follow-up Call-A-AH

## 2023-09-22 NOTE — Telephone Encounter (Signed)
 FYI Only or Action Required?: Action required by provider: clinical question for provider.  Patient was last seen in primary care on 09/20/2023 by Nche, Roselie Rockford, NP.  Called Nurse Triage reporting Wound Infection (neck).  Symptoms began several days ago.  Interventions attempted: Prescription medications: Doxyclinine; pt reports not effective/worse.  Symptoms are: gradually worsening.  Triage Disposition: No disposition on file.  Patient/caregiver understands and will follow disposition?:    Copied from CRM [id number]. Topic: Clinical - Red Word Triage >> Sep 22, 2023 11:46 AM Suzen RAMAN wrote: Red Word that prompted transfer to Nurse Triage: worsening infection on neck, requesting stronger antibiotic previous prescribed  doxycycline  (VIBRA -TABS) 100 MG tablet which has not been effective. Reason for Disposition  [1] Taking antibiotic > 48 hours (2 days) AND [2] fever still present (SAME, not better)  Answer Assessment - Initial Assessment Questions Patient requesting another antibiotic and call back. Patient reports antibiotic not working, rash getting worse.  Advised UC/ED if symptoms worsen and have not received provider's recommendations today. Provided UC at [facility] information.  1. SYMPTOM: What's the main symptom you're concerned about? (e.g., redness, swelling, pain, fever, weakness)     Redness, warm to touch, no fever,chills Pain; to neck area, more on left neck Denies ha, n/v,  2. WOUND INFECTION LOCATION: Where is the wound infection located? (e.g., arm, face, foot, knee, leg)     Neck; painful when turning head to left 3. WOUND INFECTION SIZE: What is the size of the red area? (e.g., inches, centimeters; compare to size of a coin)      Back of neck and lower scalp 4. BETTER-SAME-WORSE: Are you getting better, staying the same, or getting worse compared to the day you started the antibiotics?      Hurts more, more uncomfortable, has not gone down  at all, from neck to back of scalp 5. PAIN: Do you have any pain?  If Yes, ask: How bad is the pain?  (e.g., Scale 1-10; mild, moderate, or severe)     7/10 6. FEVER: Do you have a fever? If Yes, ask: What is it, how was it measured and when did it start?     denies 7. OTHER SYMPTOMS: Do you have any other symptoms? (e.g., pus coming from a wound, red streaks, weakness)     No red streaks, denies drainage 8. DIAGNOSIS DATE: When was the wound infection diagnosed? By whom?      09/20/23  10. ANTIBIOTIC DATE: When was the antibiotic started?       Doxycycline ;09/20/23  Protocols used: Wound Infection on Antibiotic Follow-up Call-A-AH

## 2023-09-23 ENCOUNTER — Emergency Department (HOSPITAL_BASED_OUTPATIENT_CLINIC_OR_DEPARTMENT_OTHER)

## 2023-09-23 ENCOUNTER — Emergency Department (HOSPITAL_BASED_OUTPATIENT_CLINIC_OR_DEPARTMENT_OTHER)
Admission: EM | Admit: 2023-09-23 | Discharge: 2023-09-23 | Disposition: A | Discharge status: Home / Self Care | Source: Ambulatory Visit

## 2023-09-23 ENCOUNTER — Encounter (HOSPITAL_BASED_OUTPATIENT_CLINIC_OR_DEPARTMENT_OTHER): Payer: Self-pay | Admitting: Emergency Medicine

## 2023-09-23 ENCOUNTER — Ambulatory Visit: Admission: EM | Admit: 2023-09-23 | Discharge: 2023-09-23 | Disposition: A | Discharge status: Home / Self Care

## 2023-09-23 ENCOUNTER — Other Ambulatory Visit: Payer: Self-pay

## 2023-09-23 DIAGNOSIS — L0211 Cutaneous abscess of neck: Secondary | ICD-10-CM | POA: Diagnosis present

## 2023-09-23 DIAGNOSIS — Z7984 Long term (current) use of oral hypoglycemic drugs: Secondary | ICD-10-CM | POA: Insufficient documentation

## 2023-09-23 DIAGNOSIS — E119 Type 2 diabetes mellitus without complications: Secondary | ICD-10-CM | POA: Insufficient documentation

## 2023-09-23 DIAGNOSIS — L03221 Cellulitis of neck: Secondary | ICD-10-CM | POA: Diagnosis not present

## 2023-09-23 DIAGNOSIS — E871 Hypo-osmolality and hyponatremia: Secondary | ICD-10-CM | POA: Diagnosis not present

## 2023-09-23 LAB — CBC WITH DIFFERENTIAL/PLATELET
Abs Immature Granulocytes: 0.03 K/uL (ref 0.00–0.07)
Basophils Absolute: 0 K/uL (ref 0.0–0.1)
Basophils Relative: 1 %
Eosinophils Absolute: 0.2 K/uL (ref 0.0–0.5)
Eosinophils Relative: 3 %
HCT: 42.4 % (ref 39.0–52.0)
Hemoglobin: 14.2 g/dL (ref 13.0–17.0)
Immature Granulocytes: 0 %
Lymphocytes Relative: 21 %
Lymphs Abs: 1.6 K/uL (ref 0.7–4.0)
MCH: 27.7 pg (ref 26.0–34.0)
MCHC: 33.5 g/dL (ref 30.0–36.0)
MCV: 82.8 fL (ref 80.0–100.0)
Monocytes Absolute: 0.3 K/uL (ref 0.1–1.0)
Monocytes Relative: 5 %
Neutro Abs: 5.3 K/uL (ref 1.7–7.7)
Neutrophils Relative %: 70 %
Platelets: 243 K/uL (ref 150–400)
RBC: 5.12 MIL/uL (ref 4.22–5.81)
RDW: 14.4 % (ref 11.5–15.5)
WBC: 7.5 K/uL (ref 4.0–10.5)
nRBC: 0 % (ref 0.0–0.2)

## 2023-09-23 LAB — BASIC METABOLIC PANEL WITH GFR
Anion gap: 16 — ABNORMAL HIGH (ref 5–15)
BUN: 17 mg/dL (ref 8–23)
CO2: 22 mmol/L (ref 22–32)
Calcium: 9.7 mg/dL (ref 8.9–10.3)
Chloride: 95 mmol/L — ABNORMAL LOW (ref 98–111)
Creatinine, Ser: 0.86 mg/dL (ref 0.61–1.24)
GFR, Estimated: >60 mL/min (ref >60)
Glucose, Bld: 161 mg/dL — ABNORMAL HIGH (ref 70–99)
Potassium: 3.7 mmol/L (ref 3.5–5.1)
Sodium: 133 mmol/L — ABNORMAL LOW (ref 135–145)

## 2023-09-23 MED ORDER — VANCOMYCIN HCL IN DEXTROSE 1-5 GM/200ML-% IV SOLN
1000.0000 mg | Freq: Once | INTRAVENOUS | Status: AC
  Administered 2023-09-23: 1000 mg via INTRAVENOUS
  Filled 2023-09-23: qty 200

## 2023-09-23 MED ORDER — KETOROLAC TROMETHAMINE 15 MG/ML IJ SOLN
15.0000 mg | Freq: Once | INTRAMUSCULAR | Status: AC
  Administered 2023-09-23: 15 mg via INTRAVENOUS
  Filled 2023-09-23: qty 1

## 2023-09-23 MED ORDER — VANCOMYCIN HCL 750 MG IV SOLR
750.0000 mg | Freq: Once | INTRAVENOUS | Status: AC
  Administered 2023-09-23: 750 mg via INTRAVENOUS
  Filled 2023-09-23: qty 15

## 2023-09-23 MED ORDER — IOHEXOL 300 MG/ML  SOLN
75.0000 mL | Freq: Once | INTRAMUSCULAR | Status: AC | PRN
  Administered 2023-09-23: 75 mL via INTRAVENOUS

## 2023-09-23 MED ORDER — VANCOMYCIN HCL 1750 MG/350ML IV SOLN: 1750.0000 mg | Freq: Once | INTRAVENOUS | Status: DC

## 2023-09-23 NOTE — Discharge Instructions (Addendum)
 I would continue taking the clindamycin .  Please follow-up with your primary care doctor early next week.  Return to the emergency department sooner for any worsening symptoms like we discussed.

## 2023-09-23 NOTE — ED Notes (Signed)
 Dc instructions given, pt verbalized understanding. PIV removed, C/D/I. Out of ED with all belongings, steady gait, not in visible distress.

## 2023-09-23 NOTE — ED Notes (Signed)
 Patient is being discharged from the Urgent Care and sent to the Emergency Department via private vehicle . Per Rocky Mecum PA, patient is in need of higher level of care due to worsening abscess on neck. Patient is aware and verbalizes understanding of plan of care.   Vitals:   09/23/23 1638  BP: (!) 157/84  Pulse: 79  Resp: 18  Temp: 98.1 F (36.7 C)  SpO2: 97%

## 2023-09-23 NOTE — ED Provider Notes (Signed)
 [facility]    CSN: [id number] Arrival date & time: 09/23/23  1614      History   Chief Complaint Chief Complaint  Patient presents with   Follow-up   Wound Check    HPI Shaun Velasquez is a 63 y.o. male.   HPI  Discussed the use of AI scribe software for clinical note transcription with the patient, who gave verbal consent to proceed.  The patient presents with a painful infection on the back of his neck.  The infection on the back of his neck was initially diagnosed on Monday. It began as a small bump and has since increased in size, becoming extremely painful, particularly this afternoon.  Initially prescribed doxycycline  on Monday, his medication was changed to clindamycin  yesterday. He has taken four doses of the new antibiotic without improvement. Over-the-counter pain medications, including Tylenol  and ibuprofen, have been ineffective, despite taking large doses of ibuprofen and leftover Tylenol  from a previous surgery.  He reported a blood pressure of 128/62, which he felt was elevated compared to his usual readings, and attributed this to stress and pain from the infection. He expresses significant distress over the inability to manage the pain, impacting daily functioning, stating 'I can hardly think at all.'  No drainage from the infection site. He expresses frustration with the lack of response to oral antibiotics and ongoing severe pain.    Past Medical History:  Diagnosis Date   Allergy    Anxiety    Arthritis    Collapsed lung    Diabetes mellitus without complication (HCC)    Eczema    GERD (gastroesophageal reflux disease)    Heart murmur    as child   History of chickenpox    Hyperlipemia    Hypothyroidism    PONV (postoperative nausea and vomiting)    Psoriasis    Uncontrolled diabetes mellitus type 2 without complications 05/19/2015    Patient Active Problem List   Diagnosis Date Noted   Arthritis of finger of right hand 07/05/2023    Nocturnal leg cramps 07/05/2023   Tick bite of abdominal wall 06/21/2023   Cellulitis of right axilla 10/22/2022   Psoriasis 06/24/2020   Seborrhea 06/24/2020   Primary male hypogonadism 05/22/2016   Perennial and seasonal allergic rhinitis 08/27/2015   Overweight (BMI 25.0-29.9) 05/30/2015   Hyperlipidemia 05/19/2015   Hypothyroidism 05/19/2015   Type 2 diabetes mellitus without complication, without long-term current use of insulin  (HCC) 05/19/2015   S/P shoulder replacement 12/27/2014    Past Surgical History:  Procedure Laterality Date   CHEST TUBE INSERTION     pt had collapsed lung   COLONOSCOPY  2011   on [address], not sure name of the practice(normal)   COLONOSCOPY W/ POLYPECTOMY     CYST REMOVAL HAND     arm; 2024   ELBOW SURGERY Right 2009   EXCISION OF KELOID Right 11/27/2022   Procedure: EXCISION OF SUBCUTANEOUS CYST RIGHT UPPER ARM;  Surgeon: Signe Mitzie LABOR, MD;  Location: [facility];  Service: General;  Laterality: Right;   INCISION AND DRAINAGE PERIRECTAL ABSCESS     REVERSE SHOULDER ARTHROPLASTY Right 12/27/2014   Procedure: RIGHT REVERSE SHOULDER ARTHROPLASTY;  Surgeon: Franky Pointer, MD;  Location: [facility];  Service: Orthopedics;  Laterality: Right;   ROTATOR CUFF REPAIR Right 2011   SHOULDER ARTHROSCOPY W/ LABRAL REPAIR Right    SHOULDER ARTHROSCOPY WITH ROTATOR CUFF REPAIR Left 2003   SHOULDER ARTHROSCOPY WITH SUBACROMIAL DECOMPRESSION Left  WISDOM TOOTH EXTRACTION         Home Medications    Prior to Admission medications   Medication Sig Start Date End Date Taking? Authorizing Provider  albuterol  (VENTOLIN  HFA) 108 (90 Base) MCG/ACT inhaler Inhale 2 puffs into the lungs every 6 (six) hours as needed for wheezing or shortness of breath. 12/11/20   Marinda Rocky SAILOR, MD  clindamycin  (CLEOCIN ) 300 MG capsule Take 1 capsule (300 mg total) by mouth 3 (three) times daily for 7 days. 09/22/23 09/29/23  Nche, Roselie Rockford, NP  doxycycline  (VIBRA -TABS) 100  MG tablet Take 1 tablet (100 mg total) by mouth 2 (two) times daily. 09/20/23   Nche, Charlotte Lum, NP  fluticasone  (FLONASE ) 50 MCG/ACT nasal spray Place 2 sprays into both nostrils daily as needed for allergies. 01/15/23   Thedora Garnette HERO, MD  glucose blood test strip Test fasting blood sugars once a day 12/18/21   Thedora Garnette HERO, MD  JARDIANCE  25 MG TABS tablet TAKE 1 TABLET(25 MG) BY MOUTH DAILY 01/15/23   Thedora Garnette HERO, MD  levothyroxine  (SYNTHROID ) 137 MCG tablet Take 1 tablet (137 mcg total) by mouth daily before breakfast. 07/05/23   Thedora Garnette HERO, MD  metFORMIN  (GLUCOPHAGE ) 500 MG tablet TAKE 2 TABLETS(1000 MG) BY MOUTH TWICE DAILY WITH A MEAL 05/17/23   Webb, Padonda B, FNP  montelukast  (SINGULAIR ) 10 MG tablet TAKE 1 TABLET BY MOUTH EVERYDAY AT BEDTIME Patient taking differently: Take 10 mg by mouth daily as needed (allergies). 12/20/19   Luke Orlan HERO, DO  Multiple Vitamin (MULTIVITAMIN WITH MINERALS) TABS tablet Take 1 tablet by mouth daily.    [provider]  pioglitazone  (ACTOS ) 30 MG tablet Take 1 tablet (30 mg total) by mouth daily. 12/18/22   Thedora Garnette HERO, MD  rosuvastatin  (CRESTOR ) 20 MG tablet TAKE 1 TABLET(20 MG) BY MOUTH DAILY 01/15/23   Thedora Garnette HERO, MD  SKYRIZI PEN 150 MG/ML SOAJ Inject 150 mg into the skin every 3 (three) months. 12/20/19   [provider]  tamsulosin (FLOMAX) 0.4 MG CAPS capsule Take 0.4 mg by mouth daily. 07/06/22   [provider]  testosterone  cypionate (DEPOTESTOSTERONE CYPIONATE) 200 MG/ML injection Inject 100 mg into the muscle once a week. 04/20/20   [provider]  tirzepatide  (MOUNJARO ) 5 MG/0.5ML Pen Inject 5 mg into the skin once a week. 08/04/23   Thedora Garnette HERO, MD    Family History Family History  Problem Relation Age of Onset   Allergic rhinitis Mother    Lung cancer Mother    Asthma Sister    Cancer Sister        Skin   Colon cancer Neg Hx    Colon polyps Neg Hx    Esophageal cancer Neg Hx    Stomach  cancer Neg Hx    Rectal cancer Neg Hx     Social History Social History   Tobacco Use   Smoking status: Former    Current packs/day: 0.00    Types: Cigarettes    Start date: 50    Quit date: 1996    Years since quitting: 29.7    Passive exposure: Never   Smokeless tobacco: Never  Vaping Use   Vaping status: Never Used  Substance Use Topics   Alcohol use: No   Drug use: No     Allergies   Patient has no known allergies.   Review of Systems Review of Systems  Constitutional:  Negative for chills and fever.  Musculoskeletal:  Positive for neck pain.  Skin:  Positive for wound.     Physical Exam Triage Vital Signs ED Triage Vitals  Encounter Vitals Group     BP 09/23/23 1638 (!) 157/84     Girls Systolic BP Percentile --      Girls Diastolic BP Percentile --      Boys Systolic BP Percentile --      Boys Diastolic BP Percentile --      Pulse Rate 09/23/23 1638 79     Resp 09/23/23 1638 18     Temp 09/23/23 1638 98.1 F (36.7 C)     Temp Source 09/23/23 1638 Oral     SpO2 09/23/23 1638 97 %     Weight 09/23/23 1638 199 lb (90.3 kg)     Height 09/23/23 1638 5' 9.5 (1.765 m)     Head Circumference --      Peak Flow --      Pain Score 09/23/23 1654 8     Pain Loc --      Pain Education --      Exclude from Growth Chart --    No data found.  Updated Vital Signs BP (!) 157/84 (BP Location: Right Arm)   Pulse 79   Temp 98.1 F (36.7 C) (Oral)   Resp 18   Ht 5' 9.5 (1.765 m)   Wt 199 lb (90.3 kg)   SpO2 97%   BMI 28.97 kg/m   Visual Acuity Right Eye Distance:   Left Eye Distance:   Bilateral Distance:    Right Eye Near:   Left Eye Near:    Bilateral Near:     Physical Exam Vitals reviewed.  Constitutional:      General: He is awake.     Appearance: Normal appearance. He is well-developed and well-groomed.  HENT:     Head: Normocephalic and atraumatic.  Eyes:     Extraocular Movements: Extraocular movements intact.      Conjunctiva/sclera: Conjunctivae normal.  Neck:   Pulmonary:     Effort: Pulmonary effort is normal.  Musculoskeletal:     Cervical back: Normal range of motion. Erythema present.  Neurological:     Mental Status: He is alert and oriented to person, place, and time.  Psychiatric:        Attention and Perception: Attention normal.        Mood and Affect: Mood normal.        Speech: Speech normal.        Behavior: Behavior normal. Behavior is cooperative.      UC Treatments / Results  Labs (all labs ordered are listed, but only abnormal results are displayed) Labs Reviewed - No data to display  EKG   Radiology No results found.  Procedures Procedures (including critical care time)  Medications Ordered in UC Medications - No data to display  Initial Impression / Assessment and Plan / UC Course  I have reviewed the triage vital signs and the nursing notes.  Pertinent labs & imaging results that were available during my care of the patient were reviewed by me and considered in my medical decision making (see chart for details).      Final Clinical Impressions(s) / UC Diagnoses   Final diagnoses:  Cellulitis and abscess of neck   Neck cutaneous abscess with severe pain, refractory to oral antibiotics Presents with a neck cutaneous abscess that is increasingly painful and unresponsive to oral antibiotics, including doxycycline  and clindamycin . The abscess is indurated and  rather large -stretching across the back of the neck. The condition has worsened with increased swelling and pain. Reports significant pain not alleviated by over-the-counter analgesics, and elevated blood pressure likely due to stress from the pain. Oral antibiotics have been ineffective, necessitating consideration of IV antibiotics for better efficacy. - Refer to emergency department for potential IV antibiotics and advanced pain management.     Discharge Instructions      VISIT SUMMARY:  You  came in today because of a painful infection on the back of your neck that has not improved with oral antibiotics and over-the-counter pain medications. Your blood pressure was slightly elevated, likely due to the stress and pain from the infection.  YOUR PLAN:  -NECK CUTANEOUS ABSCESS WITH SEVERE PAIN: A neck cutaneous abscess is a collection of pus that has built up within the tissue of the neck. Your infection has not responded to the oral antibiotics doxycycline  and clindamycin , and the pain has not been relieved by over-the-counter medications. Due to the severity and lack of response to treatment, you need to go to the emergency department for potential IV antibiotics and better pain management.  INSTRUCTIONS:  Please go to the emergency department as soon as possible for IV antibiotics and advanced pain management.      ED Prescriptions   None    PDMP not reviewed this encounter.   Shaun Rocky BRAVO, PA-C 09/23/23 1805

## 2023-09-23 NOTE — Progress Notes (Signed)
 ED Pharmacy Antibiotic Sign Off An antibiotic consult was received from an ED provider for Vancomycin  per pharmacy dosing for Cellulitis. A chart review was completed to assess appropriateness.   The following one time order(s) were placed:  Vancomycin  1750 mg x1  Further antibiotic and/or antibiotic pharmacy consults should be ordered by the admitting provider if indicated.   Thank you for allowing pharmacy to be a part of this patient's care.   Prentice DOROTHA Favors, PharmD PGY1 Health-System Pharmacy Administration and Leadership Resident [facility]  09/23/2023 8:16 PM

## 2023-09-23 NOTE — ED Triage Notes (Addendum)
 Pt presents to urgent care for follow-up. Has an infection on posterior neck, currently on antibiotic. Went to primary care on Monday and was prescribed Doxycycline . Symptoms became worse. Started on Clindamycin  instead yesterday after contacting provider. Pt is concerned as the bump has grew in size, more red, and the pain is unbearable. Currently rates pain an 8/10.

## 2023-09-23 NOTE — ED Triage Notes (Signed)
 Pt sent from UC for abscess to neck, pt has taken Doxycycline  and Clindamycin  with no improvement, worsening pain and increase in size

## 2023-09-23 NOTE — Discharge Instructions (Addendum)
 VISIT SUMMARY:  You came in today because of a painful infection on the back of your neck that has not improved with oral antibiotics and over-the-counter pain medications. Your blood pressure was slightly elevated, likely due to the stress and pain from the infection.  YOUR PLAN:  -NECK CUTANEOUS ABSCESS WITH SEVERE PAIN: A neck cutaneous abscess is a collection of pus that has built up within the tissue of the neck. Your infection has not responded to the oral antibiotics doxycycline  and clindamycin , and the pain has not been relieved by over-the-counter medications. Due to the severity and lack of response to treatment, you need to go to the emergency department for potential IV antibiotics and better pain management.  INSTRUCTIONS:  Please go to the emergency department as soon as possible for IV antibiotics and advanced pain management.

## 2023-09-23 NOTE — ED Provider Notes (Signed)
 [place] [facility] Provider Note   CSN: [id number] Arrival date & time: 09/23/23  1758     Patient presents with: Abscess   Shaun Velasquez is a 63 y.o. male patient with history of diabetes who presents to the Emergency Department today for further evaluation of a neck abscess.  Abscess has been there since Sunday.  He has been on doxycycline  and clindamycin  with little relief.  He states that it has been getting bigger and more painful.  He was seen evaluated urgent care today and was sent here for further evaluation.  He does not been draining.  He denies fever or chills.     Abscess      Prior to Admission medications   Medication Sig Start Date End Date Taking? Authorizing Provider  albuterol  (VENTOLIN  HFA) 108 (90 Base) MCG/ACT inhaler Inhale 2 puffs into the lungs every 6 (six) hours as needed for wheezing or shortness of breath. 12/11/20   Marinda Rocky SAILOR, MD  clindamycin  (CLEOCIN ) 300 MG capsule Take 1 capsule (300 mg total) by mouth 3 (three) times daily for 7 days. 09/22/23 09/29/23  Nche, Roselie Rockford, NP  doxycycline  (VIBRA -TABS) 100 MG tablet Take 1 tablet (100 mg total) by mouth 2 (two) times daily. 09/20/23   Nche, Charlotte Lum, NP  fluticasone  (FLONASE ) 50 MCG/ACT nasal spray Place 2 sprays into both nostrils daily as needed for allergies. 01/15/23   Thedora Garnette HERO, MD  glucose blood test strip Test fasting blood sugars once a day 12/18/21   Thedora Garnette HERO, MD  JARDIANCE  25 MG TABS tablet TAKE 1 TABLET(25 MG) BY MOUTH DAILY 01/15/23   Thedora Garnette HERO, MD  levothyroxine  (SYNTHROID ) 137 MCG tablet Take 1 tablet (137 mcg total) by mouth daily before breakfast. 07/05/23   Thedora Garnette HERO, MD  metFORMIN  (GLUCOPHAGE ) 500 MG tablet TAKE 2 TABLETS(1000 MG) BY MOUTH TWICE DAILY WITH A MEAL 05/17/23   Webb, Padonda B, FNP  montelukast  (SINGULAIR ) 10 MG tablet TAKE 1 TABLET BY MOUTH EVERYDAY AT BEDTIME Patient taking differently: Take 10 mg by mouth  daily as needed (allergies). 12/20/19   Luke Orlan HERO, DO  Multiple Vitamin (MULTIVITAMIN WITH MINERALS) TABS tablet Take 1 tablet by mouth daily.    [provider]  pioglitazone  (ACTOS ) 30 MG tablet Take 1 tablet (30 mg total) by mouth daily. 12/18/22   Thedora Garnette HERO, MD  rosuvastatin  (CRESTOR ) 20 MG tablet TAKE 1 TABLET(20 MG) BY MOUTH DAILY 01/15/23   Thedora Garnette HERO, MD  SKYRIZI PEN 150 MG/ML SOAJ Inject 150 mg into the skin every 3 (three) months. 12/20/19   [provider]  tamsulosin (FLOMAX) 0.4 MG CAPS capsule Take 0.4 mg by mouth daily. 07/06/22   [provider]  testosterone  cypionate (DEPOTESTOSTERONE CYPIONATE) 200 MG/ML injection Inject 100 mg into the muscle once a week. 04/20/20   [provider]  tirzepatide  (MOUNJARO ) 5 MG/0.5ML Pen Inject 5 mg into the skin once a week. 08/04/23   Thedora Garnette HERO, MD    Allergies: Patient has no known allergies.    Review of Systems  All other systems reviewed and are negative.   Updated Vital Signs BP (!) 148/95 (BP Location: Right Arm)   Pulse 75   Temp 98 F (36.7 C) (Oral)   Resp 17   Ht 5' 9.5 (1.765 m)   Wt 90.3 kg   SpO2 98%   BMI 28.97 kg/m   Physical Exam Vitals and nursing  note reviewed.  Constitutional:      General: He is not in acute distress.    Appearance: Normal appearance.  HENT:     Head: Normocephalic and atraumatic.  Eyes:     General:        Right eye: No discharge.        Left eye: No discharge.  Neck:     Comments: Induration and swelling and warmth to the posterior aspect of the neck.  It does extend into the right lateral neck as well.  No obvious purulence or fluctuance. Cardiovascular:     Comments: Regular rate and rhythm.  S1/S2 are distinct without any evidence of murmur, rubs, or gallops.  Radial pulses are 2+ bilaterally.  Dorsalis pedis pulses are 2+ bilaterally.  No evidence of pedal edema. Pulmonary:     Comments: Clear to auscultation bilaterally.  Normal  effort.  No respiratory distress.  No evidence of wheezes, rales, or rhonchi heard throughout. Abdominal:     General: Abdomen is flat. Bowel sounds are normal. There is no distension.     Tenderness: There is no abdominal tenderness. There is no guarding or rebound.  Musculoskeletal:        General: Normal range of motion.     Cervical back: Neck supple.  Skin:    General: Skin is warm and dry.     Findings: No rash.  Neurological:     General: No focal deficit present.     Mental Status: He is alert.  Psychiatric:        Mood and Affect: Mood normal.        Behavior: Behavior normal.     (all labs ordered are listed, but only abnormal results are displayed) Labs Reviewed  BASIC METABOLIC PANEL WITH GFR - Abnormal; Notable for the following components:      Result Value   Sodium 133 (*)    Chloride 95 (*)    Glucose, Bld 161 (*)    Anion gap 16 (*)    All other components within normal limits  CBC WITH DIFFERENTIAL/PLATELET    EKG: None  Radiology: CT Soft Tissue Neck W Contrast Result Date: 09/23/2023 CLINICAL DATA:  Initial evaluation for neck mass. EXAM: CT NECK WITH CONTRAST TECHNIQUE: Multidetector CT imaging of the neck was performed using the standard protocol following the bolus administration of intravenous contrast. RADIATION DOSE REDUCTION: This exam was performed according to the departmental dose-optimization program which includes automated exposure control, adjustment of the mA and/or kV according to patient size and/or use of iterative reconstruction technique. CONTRAST:  75mL OMNIPAQUE  IOHEXOL  300 MG/ML  SOLN COMPARISON:  None Available. FINDINGS: Pharynx and larynx: Examination degraded by motion artifact. Oral cavity grossly within normal limits, although evaluation limited by streak artifact and motion. Oropharynx demonstrates no acute finding. Nasopharynx unremarkable. No retropharyngeal collection or swelling. Epiglottis grossly within normal limits. No  obvious abnormality about the hypopharynx, supraglottic larynx, glottis, although evaluation fairly limited by motion. Subglottic airway clear. Salivary glands: Salivary glands including the parotid and submandibular glands are within normal limits. Thyroid : Normal. Lymph nodes: No enlarged or pathologic lymph nodes within the neck. Vascular: Grossly normal intravascular enhancement seen within the neck. Atheromatous change about the visualized aortic arch, carotid bifurcations, and skull base. Limited intracranial: Unremarkable. Visualized orbits: Unremarkable. Mastoids and visualized paranasal sinuses: Paranasal sinuses are largely clear. Mastoid air cells and middle ear cavities are well pneumatized and free of fluid. Skeleton: No worrisome osseous lesions. Moderate to advanced spondylosis  at C5-6 and C6-7. Upper chest: Paraseptal emphysematous changes. No other acute finding. Other: Soft tissue swelling with inflammatory stranding seen involving the subcutaneous fat of the upper posterior neck, just to the left of midline (series 3, images 47, 56). Finding suspicious for localized infection/cellulitis. No discrete abscess or drainable fluid collection. No extension into the deeper spaces of the neck at this time. IMPRESSION: 1. Soft tissue swelling with inflammatory stranding involving the subcutaneous fat of the upper posterior neck, just to the left of midline. Finding suspicious for localized infection/cellulitis. No discrete abscess or drainable fluid collection. No extension into the deeper spaces of the neck at this time. 2. No other acute abnormality within the neck. Aortic Atherosclerosis (ICD10-I70.0) and Emphysema (ICD10-J43.9). Electronically Signed   By: Morene Hoard M.D.   On: 09/23/2023 20:46     Procedures   Medications Ordered in the ED  iohexol  (OMNIPAQUE ) 300 MG/ML solution 75 mL (75 mLs Intravenous Contrast Given 09/23/23 1958)  vancomycin  (VANCOCIN ) IVPB 1000 mg/200 mL premix  (0 mg Intravenous Stopped 09/23/23 2202)    Followed by  vancomycin  (VANCOCIN ) 750 mg in sodium chloride  0.9 % 250 mL IVPB (750 mg Intravenous New Bag/Given 09/23/23 2203)  ketorolac  (TORADOL ) 15 MG/ML injection 15 mg (15 mg Intravenous Given 09/23/23 2108)    Clinical Course as of 09/23/23 2317  Thu Sep 23, 2023  2305 CBC with Differential Negative.  [CF]  2308 Basic metabolic panel(!) Hyponatremia. [CF]    Clinical Course User Index [CF] Theotis Cameron HERO, PA-C    Medical Decision Making DEYON CHIZEK is a 63 y.o. male patient who presents to the emergency department today for further evaluation of soft tissue swelling over the posterior neck.  I performed a bedside ultrasound looking at soft tissue did not see any hypoechogenicity indicating obvious fluid collection.  Will plan to get some basic labs and get a CT scan of the neck to her look for any deep-seated infection.  Will also start the patient on Vanc to cover for MRSA.  Patient is diabetic.  There is no evidence of leukocytosis on labs.  CT scan does not reveal any significant deep-seated infection just overlying cellulitis.  Will plan to discharge home after vancomycin .  Pain control with Toradol .  I will treat conservatively with NSAIDs at home he can continue his clindamycin .  Patient feeling better on repeat evaluation.  Will plan to discharge home with primary care follow-up early next week.  Strict return precautions were discussed.  He is safe for discharge.  Amount and/or Complexity of Data Reviewed Labs: ordered. Decision-making details documented in ED Course. Radiology: ordered.  Risk Prescription drug management.     Final diagnoses:  Cellulitis of neck    ED Discharge Orders     None          Theotis Cameron HERO, [place] 09/23/23 2318    Gennaro Bouchard L, DO 09/27/23 1324

## 2023-09-24 LAB — GENECONNECT MOLECULAR SCREEN: Genetic Analysis Overall Interpretation: NEGATIVE

## 2023-09-29 NOTE — Progress Notes (Signed)
 Sent message, via epic in basket, requesting orders in epic from Care[facility] adviser.

## 2023-09-30 NOTE — Patient Instructions (Signed)
 SURGICAL WAITING ROOM VISITATION  Patients having surgery or a procedure may have no more than 2 support people in the waiting area - these visitors may rotate.    Children under the age of 33 must have an adult with them who is not the patient.  Visitors with respiratory illnesses are discouraged from visiting and should remain at home.  If the patient needs to stay at the hospital during part of their recovery, the visitor guidelines for inpatient rooms apply. Pre-op nurse will coordinate an appropriate time for 1 support person to accompany patient in pre-op.  This support person may not rotate.    Please refer to the [facility] website for the visitor guidelines for Inpatients (after your surgery is over and you are in a regular room).       Your procedure is scheduled on:  10/06/23    Report to [facility] Main Entrance    Report to admitting at  1030 AM   Call this number if you have problems the morning of surgery [phone]   Do not eat food :After Midnight.   After Midnight you may have the following liquids until __ 1000____ AM DAY OF SURGERY  Water Non-Citrus Juices (without pulp, NO RED-Apple, White grape, White cranberry) Black Coffee (NO MILK/CREAM OR CREAMERS, sugar ok)  Clear Tea (NO MILK/CREAM OR CREAMERS, sugar ok) regular and decaf                             Plain Jell-O (NO RED)                                           Fruit ices (not with fruit pulp, NO RED)                                     Popsicles (NO RED)                                                               Sports drinks like Gatorade (NO RED)                            If you have questions, please contact your surgeon's office.      Oral Hygiene is also important to reduce your risk of infection.                                    Remember - BRUSH YOUR TEETH THE MORNING OF SURGERY WITH YOUR REGULAR TOOTHPASTE  DENTURES WILL BE REMOVED PRIOR TO SURGERY PLEASE DO NOT  APPLY Poly grip OR ADHESIVES!!!   Do NOT smoke after Midnight   Stop all vitamins and herbal supplements 7 days before surgery.   Take these medicines the morning of surgery with A SIP OF WATER: Inhalers as usual and bringk flonase  if needed, synthroid , flomax             Jardiance  -  hold for 72 hours prior to procedure last dose on 10/02/2023           Metformin - none am of surgery           Actos - none am of surgery           Mounjaro - last dose on   DO NOT TAKE ANY ORAL DIABETIC MEDICATIONS DAY OF YOUR SURGERY  Bring CPAP mask and tubing day of surgery.                              You may not have any metal on your body including hair pins, jewelry, and body piercing             Do not wear make-up, lotions, powders, perfumes/cologne, or deodorant  Do not wear nail polish including gel and S&S, artificial/acrylic nails, or any other type of covering on natural nails including finger and toenails. If you have artificial nails, gel coating, etc. that needs to be removed by a nail salon please have this removed prior to surgery or surgery may need to be canceled/ delayed if the surgeon/ anesthesia feels like they are unable to be safely monitored.   Do not shave  48 hours prior to surgery.               Men may shave face and neck.   Do not bring valuables to the hospital. [facility] IS NOT             RESPONSIBLE   FOR VALUABLES.   Contacts, glasses, dentures or bridgework may not be worn into surgery.   Bring small overnight bag day of surgery.   DO NOT BRING YOUR HOME MEDICATIONS TO THE HOSPITAL. PHARMACY WILL DISPENSE MEDICATIONS LISTED ON YOUR MEDICATION LIST TO YOU DURING YOUR ADMISSION IN THE HOSPITAL!    Patients discharged on the day of surgery will not be allowed to drive home.  Someone NEEDS to stay with you for the first 24 hours after anesthesia.   Special Instructions: Bring a copy of your healthcare power of attorney and living will documents the day of  surgery if you haven't scanned them before.              Please read over the following fact sheets you were given: IF YOU HAVE QUESTIONS ABOUT YOUR PRE-OP INSTRUCTIONS PLEASE CALL [phone].   If you received a COVID test during your pre-op visit  it is requested that you wear a mask when out in public, stay away from anyone that may not be feeling well and notify your surgeon if you develop symptoms. If you test positive for Covid or have been in contact with anyone that has tested positive in the last 10 days please notify you surgeon.      Pre-operative 5 CHG Bath Instructions   You can play a key role in reducing the risk of infection after surgery. Your skin needs to be as free of germs as possible. You can reduce the number of germs on your skin by washing with CHG (chlorhexidine  gluconate) soap before surgery. CHG is an antiseptic soap that kills germs and continues to kill germs even after washing.   DO NOT use if you have an allergy to chlorhexidine /CHG or antibacterial soaps. If your skin becomes reddened or irritated, stop using the CHG and notify one of our RNs at [phone].   Please shower with the CHG soap  starting 4 days before surgery using the following schedule:     Please keep in mind the following:  DO NOT shave, including legs and underarms, starting the day of your first shower.   You may shave your face at any point before/day of surgery.  Place clean sheets on your bed the day you start using CHG soap. Use a clean washcloth (not used since being washed) for each shower. DO NOT sleep with pets once you start using the CHG.   CHG Shower Instructions:  If you choose to wash your hair and private area, wash first with your normal shampoo/soap.  After you use shampoo/soap, rinse your hair and body thoroughly to remove shampoo/soap residue.  Turn the water OFF and apply about 3 tablespoons (45 ml) of CHG soap to a CLEAN washcloth.  Apply CHG soap ONLY FROM YOUR NECK  DOWN TO YOUR TOES (washing for 3-5 minutes)  DO NOT use CHG soap on face, private areas, open wounds, or sores.  Pay special attention to the area where your surgery is being performed.  If you are having back surgery, having someone wash your back for you may be helpful. Wait 2 minutes after CHG soap is applied, then you may rinse off the CHG soap.  Pat dry with a clean towel  Put on clean clothes/pajamas   If you choose to wear lotion, please use ONLY the CHG-compatible lotions on the back of this paper.     Additional instructions for the day of surgery: DO NOT APPLY any lotions, deodorants, cologne, or perfumes.   Put on clean/comfortable clothes.  Brush your teeth.  Ask your nurse before applying any prescription medications to the skin.      CHG Compatible Lotions   Aveeno Moisturizing lotion  Cetaphil Moisturizing Cream  Cetaphil Moisturizing Lotion  Clairol Herbal Essence Moisturizing Lotion, Dry Skin  Clairol Herbal Essence Moisturizing Lotion, Extra Dry Skin  Clairol Herbal Essence Moisturizing Lotion, Normal Skin  Curel Age Defying Therapeutic Moisturizing Lotion with Alpha Hydroxy  Curel Extreme Care Body Lotion  Curel Soothing Hands Moisturizing Hand Lotion  Curel Therapeutic Moisturizing Cream, Fragrance-Free  Curel Therapeutic Moisturizing Lotion, Fragrance-Free  Curel Therapeutic Moisturizing Lotion, Original Formula  Eucerin Daily Replenishing Lotion  Eucerin Dry Skin Therapy Plus Alpha Hydroxy Crme  Eucerin Dry Skin Therapy Plus Alpha Hydroxy Lotion  Eucerin Original Crme  Eucerin Original Lotion  Eucerin Plus Crme Eucerin Plus Lotion  Eucerin TriLipid Replenishing Lotion  Keri Anti-Bacterial Hand Lotion  Keri Deep Conditioning Original Lotion Dry Skin Formula Softly Scented  Keri Deep Conditioning Original Lotion, Fragrance Free Sensitive Skin Formula  Keri Lotion Fast Absorbing Fragrance Free Sensitive Skin Formula  Keri Lotion Fast Absorbing Softly  Scented Dry Skin Formula  Keri Original Lotion  Keri Skin Renewal Lotion Keri Silky Smooth Lotion  Keri Silky Smooth Sensitive Skin Lotion  Nivea Body Creamy Conditioning Oil  Nivea Body Extra Enriched Lotion  Nivea Body Original Lotion  Nivea Body Sheer Moisturizing Lotion Nivea Crme  Nivea Skin Firming Lotion  NutraDerm 30 Skin Lotion  NutraDerm Skin Lotion  NutraDerm Therapeutic Skin Cream  NutraDerm Therapeutic Skin Lotion  ProShield Protective Hand Cream  Provon moisturizing lotion  [place]- Preparing for Total Shoulder Arthroplasty    Before surgery, you can play an important role. Because skin is not sterile, your skin needs to be as free of germs as possible. You can reduce the number of germs on your skin by using the following products. Benzoyl Peroxide  Gel Reduces the number of germs present on the skin Applied twice a day to shoulder area starting two days before surgery    ==================================================================  Please follow these instructions carefully:  BENZOYL PEROXIDE 5% GEL  Please do not use if you have an allergy to benzoyl peroxide.   If your skin becomes reddened/irritated stop using the benzoyl peroxide.  Starting two days before surgery, apply as follows: Apply benzoyl peroxide in the morning and at night. Apply after taking a shower. If you are not taking a shower clean entire shoulder front, back, and side along with the armpit with a clean wet washcloth.  Place a quarter-sized dollop on your shoulder and rub in thoroughly, making sure to cover the front, back, and side of your shoulder, along with the armpit.   2 days before ____ AM   ____ PM              1 day before ____ AM   ____ PM                         Do this twice a day for two days.  (Last application is the night before surgery, AFTER using the CHG soap as described below).  Do NOT apply benzoyl peroxide gel on the day of surgery.

## 2023-10-01 ENCOUNTER — Encounter (HOSPITAL_COMMUNITY)
Admission: RE | Admit: 2023-10-01 | Discharge: 2023-10-01 | Disposition: A | Discharge status: Home / Self Care | Source: Ambulatory Visit | Attending: Orthopaedic Surgery | Admitting: Orthopaedic Surgery

## 2023-10-01 ENCOUNTER — Encounter (HOSPITAL_COMMUNITY): Payer: Self-pay

## 2023-10-01 ENCOUNTER — Other Ambulatory Visit: Payer: Self-pay

## 2023-10-01 VITALS — BP 142/79 | HR 81 | Temp 98.4°F | Resp 16 | Ht 69.0 in | Wt 198.4 lb

## 2023-10-01 DIAGNOSIS — Z01818 Encounter for other preprocedural examination: Secondary | ICD-10-CM

## 2023-10-01 DIAGNOSIS — Z01812 Encounter for preprocedural laboratory examination: Secondary | ICD-10-CM | POA: Insufficient documentation

## 2023-10-01 LAB — SURGICAL PCR SCREEN
MRSA, PCR: NEGATIVE
Staphylococcus aureus: NEGATIVE

## 2023-10-01 LAB — GLUCOSE, CAPILLARY: Glucose-Capillary: 125 mg/dL — ABNORMAL HIGH (ref 70–99)

## 2023-10-01 NOTE — Progress Notes (Addendum)
 Anesthesia Review:  PCP: Garnette Simpler  Clearance 07/14/23 in Media Tab  Cardiologist : none  PPM/ ICD: Device Orders: Rep Notified:  Chest x-ray : EKG : 11/7 2024 Echo : Stress test: Cardiac Cath :   Activity level:  can do a flight of stairs with out difficulty  Sleep Study/ CPAP : none Fasting Blood Sugar :      / Checks Blood Sugar -- times a day:     DM- type 2- checks glucose 3 times per week  Jardiance - lst dose on 10/02/2023  Mounjaro - last dose on 09/29/23 in am  Actos - none am of surgery  Metformin - none am of surgery  08/30/23-hgba1c-7.1   Blood Thinner/ Instructions /Last Dose: ASA / Instructions/ Last Dose :    PT developed cellulitis on back of neck was in ED 09/23/23.  PT states Dr Cristy is aware. Has been seen by ENT and Dermatologist. .  PT states culture grew staph.   Pt has been on Doxycyline since 09/23/2023.  Pt states Dr Cristy is not aware of culture.  Informed pt to call DR Burman today to inform of staph.  PT also stated that DR Cristy believes infection in right shoulder.     CBC and BMP done on 09/23/23. In epic.

## 2023-10-05 NOTE — H&P (Signed)
 PREOPERATIVE H&P  Chief Complaint: HARDWARE COMPLICATION  HPI: Shaun Velasquez is a 63 y.o. male who is scheduled for Procedure(s): REVISION, REVERSE TOTAL ARTHROPLASTY, SHOULDER REPAIR, ACROMIOCLAVICULAR JOINT.   Patient has a past medical history significant for DM, GERD< HLD, PONV.   Patient had a right reverse total shoulder arthroplasty years ago. He has struggled with his right shoulder. He has had swelling and an abscess near his shoulder.   Symptoms are rated as moderate to severe, and have been worsening.  This is significantly impairing activities of daily living.    Please see clinic note for further details on this patient's care.    He has elected for surgical management.   Past Medical History:  Diagnosis Date   Allergy    Arthritis    Collapsed lung    Diabetes mellitus without complication (HCC)    Eczema    GERD (gastroesophageal reflux disease)    Heart murmur    as child   History of chickenpox    Hyperlipemia    Hypothyroidism    PONV (postoperative nausea and vomiting)    Psoriasis    Uncontrolled diabetes mellitus type 2 without complications 05/19/2015   Past Surgical History:  Procedure Laterality Date   CHEST TUBE INSERTION     pt had collapsed lung   COLONOSCOPY  2011   on [address], not sure name of the practice(normal)   COLONOSCOPY W/ POLYPECTOMY     CYST REMOVAL HAND     arm; 2024   ELBOW SURGERY Right 2009   EXCISION OF KELOID Right 11/27/2022   Procedure: EXCISION OF SUBCUTANEOUS CYST RIGHT UPPER ARM;  Surgeon: Signe Mitzie LABOR, MD;  Location: [facility];  Service: General;  Laterality: Right;   INCISION AND DRAINAGE PERIRECTAL ABSCESS     left shoulder replacement      2022   REVERSE SHOULDER ARTHROPLASTY Right 12/27/2014   Procedure: RIGHT REVERSE SHOULDER ARTHROPLASTY;  Surgeon: Franky Pointer, MD;  Location: [facility] OR;  Service: Orthopedics;  Laterality: Right;   ROTATOR CUFF REPAIR Right 2011   SHOULDER ARTHROSCOPY W/ LABRAL  REPAIR Right    SHOULDER ARTHROSCOPY WITH ROTATOR CUFF REPAIR Left 2003   SHOULDER ARTHROSCOPY WITH SUBACROMIAL DECOMPRESSION Left    WISDOM TOOTH EXTRACTION     Social History   Socioeconomic History   Marital status: Married    Spouse name: Not on file   Number of children: 2   Years of education: Not on file   Highest education level: Bachelor's degree (e.g., BA, AB, BS)  Occupational History   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  Tobacco Use   Smoking status: Former    Current packs/day: 0.00    Types: Cigarettes    Start date: 1981    Quit date: 1996    Years since quitting: 29.7    Passive exposure: Never   Smokeless tobacco: Never  Vaping Use   Vaping status: Never Used  Substance and Sexual Activity   Alcohol use: No   Drug use: No   Sexual activity: Yes  Other Topics Concern   Not on file  Social History Narrative   Not on file   Social Drivers of Health   Financial Resource Strain: Low Risk  (07/05/2023)   Overall Financial Resource Strain (CARDIA)    Difficulty of Paying Living Expenses: Not very hard  Food Insecurity: No Food Insecurity (07/05/2023)   Hunger Vital Sign    Worried About Running  Out of Food in the Last Year: Never true    Ran Out of Food in the Last Year: Never true  Transportation Needs: No Transportation Needs (07/05/2023)   PRAPARE - Administrator, Civil Service (Medical): No    Lack of Transportation (Non-Medical): No  Physical Activity: Insufficiently Active (07/05/2023)   Exercise Vital Sign    Days of Exercise per Week: 4 days    Minutes of Exercise per Session: 30 min  Stress: No Stress Concern Present (07/05/2023)   [employer] of Occupational Health - Occupational Stress Questionnaire    Feeling of Stress: Not at all  Social Connections: Socially Integrated (07/05/2023)   Social Connection and Isolation Panel    Frequency of Communication with Friends and Family: More than  three times a week    Frequency of Social Gatherings with Friends and Family: More than three times a week    Attends Religious Services: More than 4 times per year    Active Member of [employer] or Organizations: Yes    Attends Engineer, structural: More than 4 times per year    Marital Status: Married   Family History  Problem Relation Age of Onset   Allergic rhinitis Mother    Lung cancer Mother    Asthma Sister    Cancer Sister        Skin   Colon cancer Neg Hx    Colon polyps Neg Hx    Esophageal cancer Neg Hx    Stomach cancer Neg Hx    Rectal cancer Neg Hx    No Known Allergies Prior to Admission medications   Medication Sig Start Date End Date Taking? Authorizing Provider  albuterol  (VENTOLIN  HFA) 108 (90 Base) MCG/ACT inhaler Inhale 2 puffs into the lungs every 6 (six) hours as needed for wheezing or shortness of breath. 12/11/20  Yes Marinda Rocky SAILOR, MD  doxycycline  (VIBRA -TABS) 100 MG tablet Take 1 tablet (100 mg total) by mouth 2 (two) times daily. 09/20/23  Yes Nche, Roselie Rockford, NP  fluticasone  (FLONASE ) 50 MCG/ACT nasal spray Place 2 sprays into both nostrils daily as needed for allergies. 01/15/23  Yes Thedora Garnette HERO, MD  JARDIANCE  25 MG TABS tablet TAKE 1 TABLET(25 MG) BY MOUTH DAILY 01/15/23  Yes Thedora Garnette HERO, MD  levothyroxine  (SYNTHROID ) 150 MCG tablet Take 150 mcg by mouth daily before breakfast.   Yes [provider]  metFORMIN  (GLUCOPHAGE ) 500 MG tablet TAKE 2 TABLETS(1000 MG) BY MOUTH TWICE DAILY WITH A MEAL 05/17/23  Yes Webb, Padonda B, FNP  montelukast  (SINGULAIR ) 10 MG tablet TAKE 1 TABLET BY MOUTH EVERYDAY AT BEDTIME Patient taking differently: Take 10 mg by mouth daily as needed (allergies). 12/20/19  Yes Luke Orlan HERO, DO  Multiple Vitamin (MULTIVITAMIN WITH MINERALS) TABS tablet Take 1 tablet by mouth daily.   Yes [provider]  pioglitazone  (ACTOS ) 30 MG tablet Take 1 tablet (30 mg total) by mouth daily. 12/18/22  Yes Thedora Garnette HERO,  MD  rosuvastatin  (CRESTOR ) 20 MG tablet TAKE 1 TABLET(20 MG) BY MOUTH DAILY 01/15/23  Yes Thedora Garnette HERO, MD  SKYRIZI PEN 150 MG/ML SOAJ Inject 150 mg into the skin every 3 (three) months. 12/20/19  Yes [provider]  tamsulosin  (FLOMAX ) 0.4 MG CAPS capsule Take 0.4 mg by mouth daily. 07/06/22  Yes [provider]  testosterone  cypionate (DEPOTESTOSTERONE CYPIONATE) 200 MG/ML injection Inject 100 mg into the muscle once a week. 04/20/20  Yes [provider]  tirzepatide  (MOUNJARO ) 5 MG/0.5ML Pen Inject 5 mg into the skin once a week. 08/04/23  Yes Thedora Garnette HERO, MD  glucose blood test strip Test fasting blood sugars once a day 12/18/21   Thedora Garnette HERO, MD  levothyroxine  (SYNTHROID ) 137 MCG tablet Take 1 tablet (137 mcg total) by mouth daily before breakfast. Patient not taking: Reported on 09/29/2023 07/05/23   Thedora Garnette HERO, MD    ROS: All other systems have been reviewed and were otherwise negative with the exception of those mentioned in the HPI and as above.  Physical Exam: General: Alert, no acute distress Cardiovascular: No pedal edema Respiratory: No cyanosis, no use of accessory musculature GI: No organomegaly, abdomen is soft and non-tender Skin: No lesions in the area of chief complaint Neurologic: Sensation intact distally Psychiatric: Patient is competent for consent with normal mood and affect Lymphatic: No axillary or cervical lymphadenopathy  MUSCULOSKELETAL:  Right shoulder: active forward elevation to 150 degrees. External rotation to 10 degrees. Internal rotation to front pocket.   Imaging: Xrays demonstrate an arthrex implant with signs of notching   BMI: Estimated body mass index is 29.3 kg/m as calculated from the following:   Height as of 10/01/23: 5' 9 (1.753 m).   Weight as of 10/01/23: 90 kg.  Lab Results  Component Value Date   ALBUMIN  4.6 10/23/2020   Diabetes:   Patient has a diagnosis of diabetes,  Lab Results  Component  Value Date   HGBA1C 7.1 (H) 08/30/2023   Smoking Status:       Assessment: HARDWARE COMPLICATION  Plan: Plan for Procedure(s): REVISION, REVERSE TOTAL ARTHROPLASTY, SHOULDER REPAIR, ACROMIOCLAVICULAR JOINT  The risks benefits and alternatives were discussed with the patient including but not limited to the risks of nonoperative treatment, versus surgical intervention including infection, bleeding, nerve injury,  blood clots, cardiopulmonary complications, morbidity, mortality, among others, and they were willing to proceed.   We additionally specifically discussed risks of axillary nerve injury, infection, periprosthetic fracture, continued pain and longevity of implants prior to beginning procedure.    Patient will be closely monitored in PACU for medical stabilization and pain control. If found stable in PACU, patient may be discharged home with outpatient follow-up. If any concerns regarding patient's stabilization patient will be admitted for observation after surgery. The patient is planning to be discharged home with outpatient PT.   The patient acknowledged the explanation, agreed to proceed with the plan and consent was signed.   Operative Plan: Right revision reverse total shoulder arthroplasty versus hemiarthroplasty with antibiotic spacer Discharge Medications: standard + abx DVT Prophylaxis: aspirin  Physical Therapy: delayed Special Discharge needs: Sling. IceMan   Aleck LOISE Stalling, PA-C  10/05/2023 8:58 PM

## 2023-10-06 ENCOUNTER — Inpatient Hospital Stay (HOSPITAL_COMMUNITY): Payer: Self-pay | Admitting: Certified Registered"

## 2023-10-06 ENCOUNTER — Inpatient Hospital Stay (HOSPITAL_COMMUNITY): Payer: Self-pay | Admitting: Physician Assistant

## 2023-10-06 ENCOUNTER — Inpatient Hospital Stay (HOSPITAL_COMMUNITY)

## 2023-10-06 ENCOUNTER — Encounter (HOSPITAL_COMMUNITY)
Admission: RE | Disposition: A | Discharge status: Home / Self Care | Payer: Self-pay | Source: Home / Self Care | Attending: Orthopaedic Surgery

## 2023-10-06 ENCOUNTER — Other Ambulatory Visit: Payer: Self-pay

## 2023-10-06 ENCOUNTER — Inpatient Hospital Stay (HOSPITAL_COMMUNITY)
Admission: RE | Admit: 2023-10-06 | Discharge: 2023-10-08 | DRG: 483 | Disposition: A | Discharge status: Home / Self Care | Attending: Orthopaedic Surgery | Admitting: Orthopaedic Surgery

## 2023-10-06 ENCOUNTER — Encounter (HOSPITAL_COMMUNITY): Payer: Self-pay | Admitting: Orthopaedic Surgery

## 2023-10-06 DIAGNOSIS — Z7984 Long term (current) use of oral hypoglycemic drugs: Secondary | ICD-10-CM | POA: Diagnosis not present

## 2023-10-06 DIAGNOSIS — Z87891 Personal history of nicotine dependence: Secondary | ICD-10-CM | POA: Diagnosis not present

## 2023-10-06 DIAGNOSIS — T8459XA Infection and inflammatory reaction due to other internal joint prosthesis, initial encounter: Secondary | ICD-10-CM | POA: Diagnosis present

## 2023-10-06 DIAGNOSIS — T8450XA Infection and inflammatory reaction due to unspecified internal joint prosthesis, initial encounter: Principal | ICD-10-CM

## 2023-10-06 DIAGNOSIS — Z79890 Hormone replacement therapy (postmenopausal): Secondary | ICD-10-CM

## 2023-10-06 DIAGNOSIS — M7138 Other bursal cyst, other site: Secondary | ICD-10-CM | POA: Diagnosis present

## 2023-10-06 DIAGNOSIS — Z801 Family history of malignant neoplasm of trachea, bronchus and lung: Secondary | ICD-10-CM | POA: Diagnosis not present

## 2023-10-06 DIAGNOSIS — Z96611 Presence of right artificial shoulder joint: Secondary | ICD-10-CM | POA: Diagnosis not present

## 2023-10-06 DIAGNOSIS — L409 Psoriasis, unspecified: Secondary | ICD-10-CM | POA: Diagnosis present

## 2023-10-06 DIAGNOSIS — B9562 Methicillin resistant Staphylococcus aureus infection as the cause of diseases classified elsewhere: Secondary | ICD-10-CM | POA: Diagnosis present

## 2023-10-06 DIAGNOSIS — T849XXA Unspecified complication of internal orthopedic prosthetic device, implant and graft, initial encounter: Secondary | ICD-10-CM | POA: Diagnosis present

## 2023-10-06 DIAGNOSIS — Z79899 Other long term (current) drug therapy: Secondary | ICD-10-CM | POA: Diagnosis not present

## 2023-10-06 DIAGNOSIS — Z7985 Long-term (current) use of injectable non-insulin antidiabetic drugs: Secondary | ICD-10-CM

## 2023-10-06 DIAGNOSIS — E785 Hyperlipidemia, unspecified: Secondary | ICD-10-CM | POA: Diagnosis present

## 2023-10-06 DIAGNOSIS — E039 Hypothyroidism, unspecified: Secondary | ICD-10-CM | POA: Diagnosis present

## 2023-10-06 DIAGNOSIS — K219 Gastro-esophageal reflux disease without esophagitis: Secondary | ICD-10-CM | POA: Diagnosis present

## 2023-10-06 DIAGNOSIS — Z01818 Encounter for other preprocedural examination: Secondary | ICD-10-CM

## 2023-10-06 DIAGNOSIS — Z794 Long term (current) use of insulin: Secondary | ICD-10-CM | POA: Diagnosis not present

## 2023-10-06 DIAGNOSIS — E119 Type 2 diabetes mellitus without complications: Secondary | ICD-10-CM | POA: Diagnosis present

## 2023-10-06 DIAGNOSIS — Y831 Surgical operation with implant of artificial internal device as the cause of abnormal reaction of the patient, or of later complication, without mention of misadventure at the time of the procedure: Secondary | ICD-10-CM | POA: Diagnosis present

## 2023-10-06 DIAGNOSIS — Z825 Family history of asthma and other chronic lower respiratory diseases: Secondary | ICD-10-CM | POA: Diagnosis not present

## 2023-10-06 HISTORY — PX: REVISION TOTAL SHOULDER TO REVERSE TOTAL SHOULDER: SHX6313

## 2023-10-06 LAB — SEDIMENTATION RATE: Sed Rate: 9 mm/h (ref 0–16)

## 2023-10-06 LAB — GLUCOSE, CAPILLARY
Glucose-Capillary: 78 mg/dL (ref 70–99)
Glucose-Capillary: 111 mg/dL — ABNORMAL HIGH (ref 70–99)
Glucose-Capillary: 180 mg/dL — ABNORMAL HIGH (ref 70–99)

## 2023-10-06 SURGERY — REVISION, REVERSE TOTAL ARTHROPLASTY, SHOULDER
Anesthesia: General | Site: Shoulder | Laterality: Right

## 2023-10-06 MED ORDER — DEXAMETHASONE SODIUM PHOSPHATE 10 MG/ML IJ SOLN
INTRAMUSCULAR | Status: DC | PRN
  Administered 2023-10-06: 4 mg via INTRAVENOUS

## 2023-10-06 MED ORDER — GABAPENTIN 300 MG PO CAPS
300.0000 mg | ORAL_CAPSULE | Freq: Once | ORAL | Status: AC
  Administered 2023-10-06: 300 mg via ORAL
  Filled 2023-10-06: qty 1

## 2023-10-06 MED ORDER — TOBRAMYCIN SULFATE 1.2 G IJ SOLR
INTRAMUSCULAR | Status: AC
  Filled 2023-10-06: qty 1.2

## 2023-10-06 MED ORDER — ONDANSETRON HCL 4 MG/2ML IJ SOLN: 4.0000 mg | Freq: Once | INTRAMUSCULAR | Status: DC | PRN

## 2023-10-06 MED ORDER — VASOPRESSIN 20 UNIT/ML IV SOLN
INTRAVENOUS | Status: AC
  Filled 2023-10-06: qty 1

## 2023-10-06 MED ORDER — CHLORHEXIDINE GLUCONATE 0.12 % MT SOLN
15.0000 mL | Freq: Once | OROMUCOSAL | Status: AC
  Administered 2023-10-06: 15 mL via OROMUCOSAL

## 2023-10-06 MED ORDER — ROCURONIUM BROMIDE 10 MG/ML (PF) SYRINGE
PREFILLED_SYRINGE | INTRAVENOUS | Status: DC | PRN
  Administered 2023-10-06: 20 mg via INTRAVENOUS
  Administered 2023-10-06: 60 mg via INTRAVENOUS

## 2023-10-06 MED ORDER — ONDANSETRON HCL 4 MG/2ML IJ SOLN
INTRAMUSCULAR | Status: DC | PRN
  Administered 2023-10-06: 4 mg via INTRAVENOUS

## 2023-10-06 MED ORDER — ONDANSETRON HCL 4 MG/2ML IJ SOLN: 4.0000 mg | Freq: Four times a day (QID) | INTRAMUSCULAR | Status: DC | PRN

## 2023-10-06 MED ORDER — FENTANYL CITRATE PF 50 MCG/ML IJ SOSY
PREFILLED_SYRINGE | INTRAMUSCULAR | Status: AC
  Filled 2023-10-06: qty 2

## 2023-10-06 MED ORDER — METOCLOPRAMIDE HCL 5 MG/ML IJ SOLN: 5.0000 mg | Freq: Three times a day (TID) | INTRAMUSCULAR | Status: DC | PRN

## 2023-10-06 MED ORDER — OXYCODONE HCL 5 MG PO TABS
10.0000 mg | ORAL_TABLET | ORAL | Status: DC | PRN
  Administered 2023-10-06: 10 mg via ORAL
  Filled 2023-10-06: qty 2

## 2023-10-06 MED ORDER — STERILE WATER FOR IRRIGATION IR SOLN
Status: DC | PRN
  Administered 2023-10-06: 2000 mL

## 2023-10-06 MED ORDER — INSULIN ASPART 100 UNIT/ML IJ SOLN: 0.0000 [IU] | Freq: Every day | INTRAMUSCULAR | Status: DC

## 2023-10-06 MED ORDER — ROCURONIUM BROMIDE 10 MG/ML (PF) SYRINGE
PREFILLED_SYRINGE | INTRAVENOUS | Status: AC
  Filled 2023-10-06: qty 10

## 2023-10-06 MED ORDER — ALBUMIN HUMAN 5 % IV SOLN: INTRAVENOUS | Status: DC | PRN

## 2023-10-06 MED ORDER — 0.9 % SODIUM CHLORIDE (POUR BTL) OPTIME
TOPICAL | Status: DC | PRN
  Administered 2023-10-06: 1000 mL

## 2023-10-06 MED ORDER — SUGAMMADEX SODIUM 200 MG/2ML IV SOLN
INTRAVENOUS | Status: DC | PRN
  Administered 2023-10-06: 200 mg via INTRAVENOUS

## 2023-10-06 MED ORDER — PHENYLEPHRINE HCL-NACL 20-0.9 MG/250ML-% IV SOLN
INTRAVENOUS | Status: DC | PRN
  Administered 2023-10-06: 20 ug/min via INTRAVENOUS

## 2023-10-06 MED ORDER — BUPIVACAINE LIPOSOME 1.3 % IJ SUSP
INTRAMUSCULAR | Status: AC
  Filled 2023-10-06: qty 10

## 2023-10-06 MED ORDER — VANCOMYCIN HCL 1000 MG IV SOLR
INTRAVENOUS | Status: DC | PRN
  Administered 2023-10-06: 1000 mg via TOPICAL

## 2023-10-06 MED ORDER — EPHEDRINE SULFATE-NACL 50-0.9 MG/10ML-% IV SOSY
PREFILLED_SYRINGE | INTRAVENOUS | Status: DC | PRN
  Administered 2023-10-06 (×2): 5 mg via INTRAVENOUS
  Administered 2023-10-06: 10 mg via INTRAVENOUS
  Administered 2023-10-06: 5 mg via INTRAVENOUS

## 2023-10-06 MED ORDER — TOBRAMYCIN SULFATE 1.2 G IJ SOLR
INTRAMUSCULAR | Status: DC | PRN
  Administered 2023-10-06: 1.2 g via TOPICAL

## 2023-10-06 MED ORDER — INSULIN ASPART 100 UNIT/ML IJ SOLN: 0.0000 [IU] | INTRAMUSCULAR | Status: DC | PRN

## 2023-10-06 MED ORDER — ORAL CARE MOUTH RINSE: 15.0000 mL | Freq: Once | OROMUCOSAL | Status: AC

## 2023-10-06 MED ORDER — HYDROMORPHONE HCL 1 MG/ML IJ SOLN: 0.5000 mg | INTRAMUSCULAR | Status: DC | PRN

## 2023-10-06 MED ORDER — VASOPRESSIN 20 UNIT/ML IV SOLN
INTRAVENOUS | Status: DC | PRN
Start: 2023-10-06 — End: 2023-10-06
  Administered 2023-10-06: 1.5 [IU] via INTRAVENOUS

## 2023-10-06 MED ORDER — MENTHOL 3 MG MT LOZG: 1.0000 | LOZENGE | OROMUCOSAL | Status: DC | PRN

## 2023-10-06 MED ORDER — ROSUVASTATIN CALCIUM 20 MG PO TABS
20.0000 mg | ORAL_TABLET | Freq: Every day | ORAL | Status: DC
  Administered 2023-10-07 – 2023-10-08 (×2): 20 mg via ORAL
  Filled 2023-10-06 (×2): qty 1

## 2023-10-06 MED ORDER — TRANEXAMIC ACID-NACL 1000-0.7 MG/100ML-% IV SOLN
1000.0000 mg | INTRAVENOUS | Status: AC
  Administered 2023-10-06: 1000 mg via INTRAVENOUS
  Filled 2023-10-06: qty 100

## 2023-10-06 MED ORDER — LEVOTHYROXINE SODIUM 75 MCG PO TABS
150.0000 ug | ORAL_TABLET | Freq: Every day | ORAL | Status: DC
  Administered 2023-10-07 – 2023-10-08 (×2): 150 ug via ORAL
  Filled 2023-10-06 (×2): qty 2

## 2023-10-06 MED ORDER — OXYCODONE HCL 5 MG PO TABS
5.0000 mg | ORAL_TABLET | ORAL | Status: DC | PRN
  Administered 2023-10-06 – 2023-10-07 (×2): 10 mg via ORAL
  Administered 2023-10-07 (×2): 5 mg via ORAL
  Administered 2023-10-08 (×2): 10 mg via ORAL
  Filled 2023-10-06 (×2): qty 2
  Filled 2023-10-06 (×2): qty 1
  Filled 2023-10-06 (×3): qty 2

## 2023-10-06 MED ORDER — LACTATED RINGERS IV SOLN: INTRAVENOUS | Status: DC

## 2023-10-06 MED ORDER — SODIUM CHLORIDE 0.9 % IR SOLN
Status: DC | PRN
  Administered 2023-10-06: 3000 mL

## 2023-10-06 MED ORDER — FENTANYL CITRATE (PF) 100 MCG/2ML IJ SOLN
INTRAMUSCULAR | Status: DC | PRN
  Administered 2023-10-06: 50 ug via INTRAVENOUS

## 2023-10-06 MED ORDER — CELECOXIB 200 MG PO CAPS
200.0000 mg | ORAL_CAPSULE | Freq: Two times a day (BID) | ORAL | Status: DC
Start: 2023-10-06 — End: 2023-10-08
  Administered 2023-10-06 – 2023-10-08 (×4): 200 mg via ORAL
  Filled 2023-10-06 (×4): qty 1

## 2023-10-06 MED ORDER — ONDANSETRON HCL 4 MG/2ML IJ SOLN
INTRAMUSCULAR | Status: AC
  Filled 2023-10-06: qty 2

## 2023-10-06 MED ORDER — METHOCARBAMOL 500 MG PO TABS
500.0000 mg | ORAL_TABLET | Freq: Four times a day (QID) | ORAL | Status: DC | PRN
  Administered 2023-10-07 – 2023-10-08 (×4): 500 mg via ORAL
  Filled 2023-10-06 (×5): qty 1

## 2023-10-06 MED ORDER — ZOLPIDEM TARTRATE 5 MG PO TABS: 5.0000 mg | ORAL_TABLET | Freq: Every evening | ORAL | Status: DC | PRN

## 2023-10-06 MED ORDER — FENTANYL CITRATE PF 50 MCG/ML IJ SOSY
25.0000 ug | PREFILLED_SYRINGE | INTRAMUSCULAR | Status: DC | PRN
  Administered 2023-10-06: 50 ug via INTRAVENOUS

## 2023-10-06 MED ORDER — METOCLOPRAMIDE HCL 5 MG PO TABS: 5.0000 mg | ORAL_TABLET | Freq: Three times a day (TID) | ORAL | Status: DC | PRN

## 2023-10-06 MED ORDER — METHOCARBAMOL 1000 MG/10ML IJ SOLN
INTRAMUSCULAR | Status: AC
  Filled 2023-10-06: qty 10

## 2023-10-06 MED ORDER — MIDAZOLAM HCL 2 MG/2ML IJ SOLN
1.0000 mg | INTRAMUSCULAR | Status: DC
  Administered 2023-10-06: 2 mg via INTRAVENOUS
  Filled 2023-10-06: qty 2

## 2023-10-06 MED ORDER — METHOCARBAMOL 1000 MG/10ML IJ SOLN
500.0000 mg | Freq: Four times a day (QID) | INTRAMUSCULAR | Status: DC | PRN
  Administered 2023-10-06: 500 mg via INTRAVENOUS

## 2023-10-06 MED ORDER — DIPHENHYDRAMINE HCL 12.5 MG/5ML PO ELIX: 12.5000 mg | ORAL_SOLUTION | ORAL | Status: DC | PRN

## 2023-10-06 MED ORDER — MINERAL OIL LIGHT OIL
TOPICAL_OIL | Status: AC
  Filled 2023-10-06: qty 10

## 2023-10-06 MED ORDER — PROPOFOL 10 MG/ML IV BOLUS
INTRAVENOUS | Status: AC
  Filled 2023-10-06: qty 20

## 2023-10-06 MED ORDER — SODIUM CHLORIDE 0.9 % IR SOLN
Status: DC | PRN
  Administered 2023-10-06: 1000 mL

## 2023-10-06 MED ORDER — LIDOCAINE HCL (CARDIAC) PF 100 MG/5ML IV SOSY
PREFILLED_SYRINGE | INTRAVENOUS | Status: DC | PRN
  Administered 2023-10-06: 60 mg via INTRAVENOUS

## 2023-10-06 MED ORDER — SUGAMMADEX SODIUM 200 MG/2ML IV SOLN
INTRAVENOUS | Status: AC
  Filled 2023-10-06: qty 2

## 2023-10-06 MED ORDER — PHENOL 1.4 % MT LIQD: 1.0000 | OROMUCOSAL | Status: DC | PRN

## 2023-10-06 MED ORDER — INSULIN ASPART 100 UNIT/ML IJ SOLN
0.0000 [IU] | Freq: Three times a day (TID) | INTRAMUSCULAR | Status: DC
  Administered 2023-10-07: 2 [IU] via SUBCUTANEOUS
  Administered 2023-10-07: 3 [IU] via SUBCUTANEOUS
  Administered 2023-10-07: 2 [IU] via SUBCUTANEOUS
  Administered 2023-10-08: 8 [IU] via SUBCUTANEOUS
  Administered 2023-10-08: 2 [IU] via SUBCUTANEOUS

## 2023-10-06 MED ORDER — CEFAZOLIN SODIUM-DEXTROSE 2-4 GM/100ML-% IV SOLN
2.0000 g | INTRAVENOUS | Status: AC
  Administered 2023-10-06: 2 g via INTRAVENOUS
  Filled 2023-10-06: qty 100

## 2023-10-06 MED ORDER — PROPOFOL 10 MG/ML IV BOLUS
INTRAVENOUS | Status: DC | PRN
  Administered 2023-10-06: 170 mg via INTRAVENOUS

## 2023-10-06 MED ORDER — ACETAMINOPHEN 500 MG PO TABS
1000.0000 mg | ORAL_TABLET | Freq: Four times a day (QID) | ORAL | Status: AC
  Administered 2023-10-06 – 2023-10-07 (×4): 1000 mg via ORAL
  Filled 2023-10-06 (×4): qty 2

## 2023-10-06 MED ORDER — MAGNESIUM CITRATE PO SOLN: 1.0000 | Freq: Once | ORAL | Status: DC | PRN

## 2023-10-06 MED ORDER — POLYETHYLENE GLYCOL 3350 17 G PO PACK: 17.0000 g | PACK | Freq: Every day | ORAL | Status: DC | PRN

## 2023-10-06 MED ORDER — DOCUSATE SODIUM 100 MG PO CAPS
100.0000 mg | ORAL_CAPSULE | Freq: Two times a day (BID) | ORAL | Status: DC
  Administered 2023-10-06 – 2023-10-08 (×4): 100 mg via ORAL
  Filled 2023-10-06 (×4): qty 1

## 2023-10-06 MED ORDER — ALBUTEROL SULFATE (2.5 MG/3ML) 0.083% IN NEBU: 3.0000 mL | INHALATION_SOLUTION | Freq: Four times a day (QID) | RESPIRATORY_TRACT | Status: DC | PRN

## 2023-10-06 MED ORDER — AMISULPRIDE (ANTIEMETIC) 5 MG/2ML IV SOLN: 10.0000 mg | Freq: Once | INTRAVENOUS | Status: DC | PRN

## 2023-10-06 MED ORDER — DEXAMETHASONE SODIUM PHOSPHATE 10 MG/ML IJ SOLN
INTRAMUSCULAR | Status: AC
Start: 2023-10-06 — End: 2023-10-06
  Filled 2023-10-06: qty 1

## 2023-10-06 MED ORDER — LIDOCAINE HCL (PF) 2 % IJ SOLN
INTRAMUSCULAR | Status: AC
  Filled 2023-10-06: qty 5

## 2023-10-06 MED ORDER — ACETAMINOPHEN 500 MG PO TABS
1000.0000 mg | ORAL_TABLET | Freq: Once | ORAL | Status: AC
  Administered 2023-10-06: 1000 mg via ORAL
  Filled 2023-10-06: qty 2

## 2023-10-06 MED ORDER — ONDANSETRON HCL 4 MG PO TABS: 4.0000 mg | ORAL_TABLET | Freq: Four times a day (QID) | ORAL | Status: DC | PRN

## 2023-10-06 MED ORDER — FENTANYL CITRATE (PF) 100 MCG/2ML IJ SOLN
INTRAMUSCULAR | Status: AC
  Filled 2023-10-06: qty 2

## 2023-10-06 MED ORDER — VANCOMYCIN HCL 1000 MG IV SOLR
INTRAVENOUS | Status: AC
Start: 2023-10-06 — End: 2023-10-06
  Filled 2023-10-06: qty 20

## 2023-10-06 MED ORDER — FENTANYL CITRATE PF 50 MCG/ML IJ SOSY
50.0000 ug | PREFILLED_SYRINGE | INTRAMUSCULAR | Status: DC
  Administered 2023-10-06: 50 ug via INTRAVENOUS
  Filled 2023-10-06: qty 2

## 2023-10-06 MED ORDER — SODIUM CHLORIDE 0.9 % IV SOLN
2.0000 g | INTRAVENOUS | Status: DC
  Administered 2023-10-07 – 2023-10-08 (×2): 2 g via INTRAVENOUS
  Filled 2023-10-06 (×2): qty 20

## 2023-10-06 MED ORDER — TAMSULOSIN HCL 0.4 MG PO CAPS
0.4000 mg | ORAL_CAPSULE | Freq: Every day | ORAL | Status: DC
  Administered 2023-10-07 – 2023-10-08 (×2): 0.4 mg via ORAL
  Filled 2023-10-06 (×2): qty 1

## 2023-10-06 MED ORDER — BISACODYL 10 MG RE SUPP: 10.0000 mg | Freq: Every day | RECTAL | Status: DC | PRN

## 2023-10-06 MED ORDER — PHENYLEPHRINE 80 MCG/ML (10ML) SYRINGE FOR IV PUSH (FOR BLOOD PRESSURE SUPPORT)
PREFILLED_SYRINGE | INTRAVENOUS | Status: DC | PRN
  Administered 2023-10-06 (×2): 160 ug via INTRAVENOUS

## 2023-10-06 SURGICAL SUPPLY — 123 items
BAG COUNTER SPONGE SURGICOUNT (BAG) ×1 IMPLANT
BASEPLATE GLENOID RSA 3X25 0D (Shoulder) IMPLANT
BIT DRILL 3.2 PERIPHERAL SCREW (BIT) IMPLANT
BLADE AVERAGE 25X9 (BLADE) IMPLANT
BLADE EXCALIBUR 4.0X13 (MISCELLANEOUS) ×1 IMPLANT
BLADE FLEX CHISEL 8 2.6 (MISCELLANEOUS) IMPLANT
BLADE SAW SAG 29X58X.64 (BLADE) IMPLANT
BLADE SAW SGTL 73X25 THK (BLADE) IMPLANT
BLADE SAW SGTL 81X20 HD (BLADE) ×1 IMPLANT
BLADE SURG 15 STRL LF DISP TIS (BLADE) ×1 IMPLANT
BLADE SURG SZ10 CARB STEEL (BLADE) ×1 IMPLANT
BNDG COHESIVE 4X5 TAN STRL LF (GAUZE/BANDAGES/DRESSINGS) IMPLANT
BODY PROXIMAL PTC 13X132.5 (Joint) IMPLANT
BRUSH FEMORAL CANAL (MISCELLANEOUS) IMPLANT
BUR OVAL CARBIDE 4.0 (BURR) IMPLANT
BUR ROUND MED (BURR) IMPLANT
BURR OVAL 8 FLU 4.0X13 (MISCELLANEOUS) IMPLANT
CANNULA PASSPORT BUTTON 10-40 (CANNULA) ×1 IMPLANT
CAP LOCKING COCR (Cap) IMPLANT
CEMENT BONE DEPUY (Cement) IMPLANT
CHLORAPREP W/TINT 26 (MISCELLANEOUS) ×2 IMPLANT
CLSR STERI-STRIP ANTIMIC 1/2X4 (GAUZE/BANDAGES/DRESSINGS) ×1 IMPLANT
CNTNR URN SCR LID CUP LEK RST (MISCELLANEOUS) ×3 IMPLANT
COOLER ICEMAN CLASSIC (MISCELLANEOUS) ×1 IMPLANT
COVER BACK TABLE 60X90IN (DRAPES) IMPLANT
COVER SURGICAL LIGHT HANDLE (MISCELLANEOUS) ×1 IMPLANT
DRAPE C-ARM 42X120 X-RAY (DRAPES) IMPLANT
DRAPE IMP U-DRAPE 54X76 (DRAPES) ×1 IMPLANT
DRAPE INCISE IOBAN 66X45 STRL (DRAPES) ×1 IMPLANT
DRAPE OEC MINIVIEW 54X84 (DRAPES) IMPLANT
DRAPE POUCH INSTRU U-SHP 10X18 (DRAPES) ×1 IMPLANT
DRAPE SHEET LG 3/4 BI-LAMINATE (DRAPES) ×1 IMPLANT
DRAPE SHOULDER BEACH CHAIR (DRAPES) ×2 IMPLANT
DRAPE SURG ORHT 6 SPLT 77X108 (DRAPES) ×2 IMPLANT
DRAPE TOP 10253 STERILE (DRAPES) ×1 IMPLANT
DRSG AQUACEL AG ADV 3.5X 6 (GAUZE/BANDAGES/DRESSINGS) ×1 IMPLANT
DRSG AQUACEL AG ADV 3.5X10 (GAUZE/BANDAGES/DRESSINGS) IMPLANT
DW OUTFLOW CASSETTE/TUBE SET (MISCELLANEOUS) ×1 IMPLANT
ELECT BLADE TIP CTD 4 INCH (ELECTRODE) ×1 IMPLANT
ELECT NDL TIP 2.8 STRL (NEEDLE) IMPLANT
ELECT NEEDLE TIP 2.8 STRL (NEEDLE) IMPLANT
ELECT PENCIL ROCKER SW 15FT (MISCELLANEOUS) ×1 IMPLANT
ELECT REM PT RETURN 15FT ADLT (MISCELLANEOUS) ×1 IMPLANT
ELECTRODE REM PT RTRN 9FT ADLT (ELECTROSURGICAL) ×1 IMPLANT
EVACUATOR 1/8 PVC DRAIN (DRAIN) IMPLANT
FACESHIELD WRAPAROUND OR TEAM (MASK) ×3 IMPLANT
FIBER TAPE 2MM (SUTURE) ×2 IMPLANT
FIBERSTICK 2 (SUTURE) ×2 IMPLANT
GAUZE PAD ABD 8X10 STRL (GAUZE/BANDAGES/DRESSINGS) ×1 IMPLANT
GAUZE SPONGE 4X4 12PLY STRL (GAUZE/BANDAGES/DRESSINGS) ×1 IMPLANT
GAUZE XEROFORM 1X8 LF (GAUZE/BANDAGES/DRESSINGS) ×1 IMPLANT
GLOVE BIO SURGEON STRL SZ 6.5 (GLOVE) ×2 IMPLANT
GLOVE BIOGEL PI IND STRL 6.5 (GLOVE) ×1 IMPLANT
GLOVE BIOGEL PI IND STRL 8 (GLOVE) ×1 IMPLANT
GLOVE ECLIPSE 8.0 STRL XLNG CF (GLOVE) ×1 IMPLANT
GOWN STRL REUS W/ TWL LRG LVL3 (GOWN DISPOSABLE) ×2 IMPLANT
GOWN STRL REUS W/ TWL XL LVL3 (GOWN DISPOSABLE) ×1 IMPLANT
GOWN STRL REUS W/TWL XL LVL3 (GOWN DISPOSABLE) ×1 IMPLANT
GUIDEWIRE GLENOID 2.5X220 (WIRE) IMPLANT
IMPL REVERSE SHOULDER 0X3.5 (Shoulder) IMPLANT
INSERT FLEX SHLD REV 39 +6 (Insert) IMPLANT
INSERT FLEX SHLD REV 39 +9 (Insert) IMPLANT
KIT BASIN OR (CUSTOM PROCEDURE TRAY) ×1 IMPLANT
KIT STABILIZATION SHOULDER (MISCELLANEOUS) ×1 IMPLANT
KIT TURNOVER KIT A (KITS) ×1 IMPLANT
LASSO 90 CVE QUICKPAS (DISPOSABLE) IMPLANT
MANIFOLD NEPTUNE II (INSTRUMENTS) ×1 IMPLANT
NDL TAPERED W/ NITINOL LOOP (MISCELLANEOUS) IMPLANT
NEEDLE TAPERED W/ NITINOL LOOP (MISCELLANEOUS) IMPLANT
NS IRRIG 1000ML POUR BTL (IV SOLUTION) ×1 IMPLANT
PACK ARTHROSCOPY DSU (CUSTOM PROCEDURE TRAY) ×1 IMPLANT
PACK SHOULDER (CUSTOM PROCEDURE TRAY) ×1 IMPLANT
PAD COLD SHLDR WRAP-ON (PAD) ×1 IMPLANT
PENCIL SMOKE EVACUATOR (MISCELLANEOUS) ×1 IMPLANT
RESTRAINT HEAD UNIVERSAL NS (MISCELLANEOUS) ×1 IMPLANT
RETRIEVER SUT HEWSON (MISCELLANEOUS) IMPLANT
SCREW 5.5X14 (Screw) IMPLANT
SCREW 5.5X22 (Screw) IMPLANT
SCREW ASSEMBLY COCR TYPE 0 (Screw) IMPLANT
SCREW BONE SMALL REVERSED 15 (Screw) IMPLANT
SCREW PERIPHERAL 30 (Screw) IMPLANT
SCREW PERIPHERAL 5.0X34 (Screw) IMPLANT
SCREW SHLD ASSEMBLY AEQ 20 (Screw) IMPLANT
SET HNDPC FAN SPRY TIP SCT (DISPOSABLE) ×1 IMPLANT
SHEET MEDIUM DRAPE 40X70 STRL (DRAPES) ×1 IMPLANT
SLEEVE SCD COMPRESS KNEE MED (STOCKING) ×1 IMPLANT
SLING ARM FOAM STRAP LRG (SOFTGOODS) IMPLANT
SLING ARM FOAM STRAP MED (SOFTGOODS) IMPLANT
SLING ARM FOAM STRAP XLG (SOFTGOODS) IMPLANT
SLING ARM IMMOBILIZER LRG (SOFTGOODS) IMPLANT
SLING ARM IMMOBILIZER MED (SOFTGOODS) IMPLANT
SLING ULTRA III MED (ORTHOPEDIC SUPPLIES) IMPLANT
SOLUTION PRONTOSAN WOUND 350ML (IRRIGATION / IRRIGATOR) ×1 IMPLANT
SPACER SHLD CMT AEQ 13X20 (Shoulder) IMPLANT
SPHERE GLENOD +3XINFR OFST 39X (Shoulder) IMPLANT
SPIKE FLUID TRANSFER (MISCELLANEOUS) IMPLANT
SPONGE T-LAP 18X18 ~~LOC~~+RFID (SPONGE) ×1 IMPLANT
SPONGE T-LAP 4X18 ~~LOC~~+RFID (SPONGE) IMPLANT
STEM SHLDR DIST PRTLY COATD 13 (Miscellaneous) IMPLANT
SUPPORT WRAP ARM LG (MISCELLANEOUS) ×1 IMPLANT
SUT ETHIBOND 2 V 37 (SUTURE) ×1 IMPLANT
SUT ETHILON 2 0 PS N (SUTURE) IMPLANT
SUT ETHILON 3 0 FSL (SUTURE) ×1 IMPLANT
SUT ETHILON 3 0 PS 1 (SUTURE) IMPLANT
SUT MNCRL AB 4-0 PS2 18 (SUTURE) ×1 IMPLANT
SUT TIGER TAPE 7 IN WHITE (SUTURE) IMPLANT
SUT VIC AB 0 CT1 27XBRD ANBCTR (SUTURE) ×1 IMPLANT
SUT VIC AB 3-0 SH 27X BRD (SUTURE) ×1 IMPLANT
SUT VICRYL+ 3-0 36IN CT-1 (SUTURE) IMPLANT
SUTURE FIBERWR #2 38 T-5 BLUE (SUTURE) IMPLANT
SUTURE FIBERWR #5 38 CONV NDL (SUTURE) IMPLANT
SUTURE FIBERWR 2-0 18 17.9 3/8 (SUTURE) IMPLANT
SUTURE TAPE 1.3 FIBERLOP 20 ST (SUTURE) ×2 IMPLANT
TAPE FIBER 2MM 7IN #2 BLUE (SUTURE) IMPLANT
TOWEL GREEN STERILE FF (TOWEL DISPOSABLE) ×1 IMPLANT
TOWEL OR 17X26 10 PK STRL BLUE (TOWEL DISPOSABLE) ×1 IMPLANT
TOWER SMARTMIX MINI (MISCELLANEOUS) IMPLANT
TRAY REV FLEX AEQUALIS 3.5X6 (Shoulder) IMPLANT
TUBE CONNECTING 20X1/4 (TUBING) ×1 IMPLANT
TUBE SUCTION HIGH CAP CLEAR NV (SUCTIONS) ×1 IMPLANT
TUBING ARTHROSCOPY IRRIG 16FT (MISCELLANEOUS) ×1 IMPLANT
WAND ABLATOR APOLLO I90 (BUR) ×1 IMPLANT
WATER STERILE IRR 1000ML POUR (IV SOLUTION) ×1 IMPLANT

## 2023-10-06 NOTE — Anesthesia Procedure Notes (Addendum)
 Procedure Name: Intubation Date/Time: 10/06/2023 1:30 PM  Performed by: Metta Andrea NOVAK, CRNAPre-anesthesia Checklist: Patient identified, Emergency Drugs available, Suction available, Patient being monitored and Timeout performed Patient Re-evaluated:Patient Re-evaluated prior to induction Oxygen Delivery Method: Circle system utilized Preoxygenation: Pre-oxygenation with 100% oxygen Induction Type: IV induction Ventilation: Mask ventilation without difficulty Laryngoscope Size: Mac and 4 Grade View: Grade II Tube type: Oral Tube size: 7.5 mm Number of attempts: 1 Airway Equipment and Method: Stylet Placement Confirmation: ETT inserted through vocal cords under direct vision, positive ETCO2 and breath sounds checked- equal and bilateral Secured at: 23 cm Tube secured with: Tape Dental Injury: Teeth and Oropharynx as per pre-operative assessment

## 2023-10-06 NOTE — Anesthesia Postprocedure Evaluation (Signed)
 Anesthesia Post Note  Patient: Shaun Velasquez  Procedure(s) Performed: RIGHT REVISION TOTAL SHOULDER ARTHROPLASTY (Right: Shoulder)     Patient location during evaluation: PACU Anesthesia Type: General Level of consciousness: awake and alert Pain management: pain level controlled Vital Signs Assessment: post-procedure vital signs reviewed and stable Respiratory status: spontaneous breathing, nonlabored ventilation, respiratory function stable and patient connected to nasal cannula oxygen Cardiovascular status: blood pressure returned to baseline and stable Postop Assessment: no apparent nausea or vomiting Anesthetic complications: no   No notable events documented.  Last Vitals:  Vitals:   10/06/23 1707 10/06/23 1715  BP:  115/73  Pulse: 94 98  Resp: 12 12  Temp:    SpO2: 98% 98%    Last Pain:  Vitals:   10/06/23 1715  TempSrc:   PainSc: 3                  [name]

## 2023-10-06 NOTE — Interval H&P Note (Signed)
 All questions answered, patient wants to proceed with procedure. ? ?

## 2023-10-06 NOTE — Anesthesia Preprocedure Evaluation (Addendum)
 Anesthesia Evaluation  Patient identified by MRN, date of birth, ID band Patient awake    Reviewed: Allergy & Precautions, NPO status , Patient's Chart, lab work & pertinent test results  History of Anesthesia Complications (+) PONV and history of anesthetic complications  Airway Mallampati: II  TM Distance: >3 FB Neck ROM: Full    Dental  (+) Teeth Intact, Dental Advisory Given, Caps,    Pulmonary former smoker   Pulmonary exam normal breath sounds clear to auscultation       Cardiovascular Exercise Tolerance: Good negative cardio ROS Normal cardiovascular exam Rhythm:Regular Rate:Normal     Neuro/Psych negative neurological ROS  negative psych ROS   GI/Hepatic Neg liver ROS,GERD  ,,  Endo/Other  diabetes, Type 2, Oral Hypoglycemic AgentsHypothyroidism    Renal/GU negative Renal ROS     Musculoskeletal  (+) Arthritis ,  HARDWARE COMPLICATION   Abdominal   Peds  Hematology negative hematology ROS (+)   Anesthesia Other Findings Day of surgery medications reviewed with the patient.  Reproductive/Obstetrics                              Anesthesia Physical Anesthesia Plan  ASA: 2  Anesthesia Plan: General   Post-op Pain Management: Regional block* and Tylenol  PO (pre-op)*   Induction: Intravenous  PONV Risk Score and Plan: 3 and Midazolam , Dexamethasone  and Ondansetron   Airway Management Planned: Oral ETT  Additional Equipment:   Intra-op Plan:   Post-operative Plan: Extubation in OR  Informed Consent: I have reviewed the patients History and Physical, chart, labs and discussed the procedure including the risks, benefits and alternatives for the proposed anesthesia with the patient or authorized representative who has indicated his/her understanding and acceptance.     Dental advisory given  Plan Discussed with: CRNA  Anesthesia Plan Comments:         Anesthesia  Quick Evaluation

## 2023-10-06 NOTE — Transfer of Care (Signed)
 Immediate Anesthesia Transfer of Care Note  Patient: Shaun Velasquez  Procedure(s) Performed: RIGHT REVISION TOTAL SHOULDER ARTHROPLASTY (Right: Shoulder)  Patient Location: PACU  Anesthesia Type:GA combined with regional for post-op pain  Level of Consciousness: drowsy  Airway & Oxygen Therapy: Patient Spontanous Breathing and Patient connected to face mask oxygen  Post-op Assessment: Report given to RN and Post -op Vital signs reviewed and stable  Post vital signs: Reviewed and stable  Last Vitals:  Vitals Value Taken Time  BP 130/75 10/06/23 16:42  Temp    Pulse 89 10/06/23 16:45  Resp 16 10/06/23 16:45  SpO2 100 % 10/06/23 16:45  Vitals shown include unfiled device data.  Last Pain:  Vitals:   10/06/23 1142  TempSrc:   PainSc: 0-No pain         Complications: No notable events documented.

## 2023-10-06 NOTE — Op Note (Signed)
 Orthopaedic Surgery Operative Note (CSN: [id number])  Shaun Velasquez  01-29-1960 Date of Surgery: 10/06/2023   Diagnoses:  Right infected total shoulder arthroplasty  Procedure: Right revision reverse total Shoulder Arthroplasty modifier 22 Right removal of hardware Right open shoulder synovectomy   Operative Finding Successful completion of planned procedure.  Patient's case was extraordinarily difficult.  The stem was relatively routine removing it however the old implants from Arthrex had screw heads breakoff and the central screw in the glenoid had a screwdriver break in the head cold welding in place preventing access.  We ended up using a metal cutting bur to remove the entire head of the screw before being able to remove the baseplate.  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  With all of these things as well as his diabetes and his use of immune modulators he is at high risk for recurrent infection.  I did feel that he was appropriate for single-stage revision arthroplasty based on the criteria for the shoulder as there was no gross purulence however there was some unhealthy appearing tissue and significant bone resorption concerning for infection.  No therapy for 4 weeks.  We will defer to infectious disease regarding infectious ease management of his antibiotics after surgery  Post-operative plan: The patient will be NWB in sling.  The patient will be will be admitted to observation due to medical complexity, monitoring and pain management.  DVT prophylaxis Aspirin  81 mg twice daily for 6 weeks.  Pain control with PRN pain medication preferring oral medicines.  Follow up plan will be scheduled in approximately 7 days for incision check and XR.  Physical therapy to start after 4 weeks.  Implants: Tornier revive stem, partially coated 13, 20 spacer, 13 proximal body, 0 high offset tray with a +9 polyethylene  retentive, 39 eccentric sphere with the centricity aimed at 6:00, +3 25 baseplate with a long center post and 4 peripheral screws  Post-Op Diagnosis: Same Surgeons:Primary: Cristy Bonner DASEN, MD Assistants:Caroline McBane, PA-C Location: [facility] ROOM 06 Anesthesia: General with Exparel  Interscalene Antibiotics: Ancef  2g preop, Vancomycin  1000mg  locally Tourniquet time: None Estimated Blood Loss: 100 Complications: None Specimens: 3 for culture, hold for 2 weeks to rule out C acnes Implants: Implant Name Type Inv. Item Serial No. Manufacturer Lot No. LRB No. Used Action  SCREW BONE SMALL REVERSED 15 - [id number] Screw SCREW BONE SMALL REVERSED 15 [id number] [employer]  Right 1 Implanted  BASEPLATE GLENOID RSA 3X25 0D - [id number] Shoulder BASEPLATE GLENOID RSA 3X25 0D [id number] [employer]  Right 1 Implanted  SPHERE GLENOD +3XINFR OFST 39X - [id number] Shoulder SPHERE GLENOD +3XINFR OFST 39X [id number] [employer]  Right 1 Implanted  SCREW 5.5X22 - [id number] Screw SCREW 5.5X22  [employer]  Right 1 Implanted  SCREW PERIPHERAL 30 - [id number] Screw SCREW PERIPHERAL 30  [employer]  Right 1 Implanted  SCREW 5.5X14 - [id number] Screw SCREW 5.5X14  [employer]  Right 1 Implanted  SCREW PERIPHERAL 5.0X34 - [id number] Screw SCREW PERIPHERAL 5.0X34  [employer]  Right 1 Implanted  BODY PROXIMAL PTC 13X132.5 - [id number] Joint BODY PROXIMAL PTC 13X132.5 [id number] [employer]  Right 1 Implanted  CAP LOCKING COCR - [id number] Cap CAP LOCKING COCR [id number] [employer]  Right 1 Implanted  SPACER SHLD CMT AEQ 13X20 - [id number] Shoulder SPACER SHLD CMT AEQ 13X20 [id number] [employer]  Right 1 Implanted  STEM SHLDR DIST PRTLY COATD 13 -  [id number] Miscellaneous STEM SHLDR DIST PRTLY COATD 13 [id number] [employer]  Right 1 Implanted  SCREW SHLD ASSEMBLY AEQ 20 - [id number] Screw SCREW SHLD ASSEMBLY AEQ 20 [id number] [employer]  Right 1 Implanted  INSERT FLEX SHLD REV 39 +9 -  [id number] Insert INSERT FLEX SHLD REV 39 +9 [id number] [employer]  Right 1 Implanted  IMPL REVERSE SHOULDER 0X3.5 - [id number] Shoulder IMPL REVERSE SHOULDER 0X3.5 [id number] [employer]  Right 1 Implanted    Indications for Surgery:   Shaun Velasquez is a 63 y.o. male with previous infected total shoulder arthroplasty by another surgeon.  Benefits and risks of operative and nonoperative management were discussed prior to surgery with patient/guardian(s) and informed consent form was completed.  Infection and need for further surgery were discussed as was prosthetic stability and cuff issues.  We additionally specifically discussed risks of axillary nerve injury, infection, periprosthetic fracture, continued pain and longevity of implants prior to beginning procedure.      Procedure:   The patient was identified in the preoperative holding area where the surgical site was marked. Block placed by anesthesia with exparel .  The patient was taken to the OR where a procedural timeout was called and the above noted anesthesia was induced.  The patient was positioned beachchair on allen table with spider arm positioner.  Preoperative antibiotics were dosed.  The patient's right shoulder was prepped and draped in the usual sterile fashion.  A second preoperative timeout was called.       We began by using the previous deltopectoral incision.  Went through skin sharply to hemostasis we progressed.  We identified the scarred in interval however the cephalic vein had been previously damaged.  We were able to bluntly dissect this interval.  We then dissected lateral to the conjoined tendon and were able to palpate bone.  The upper border the pectoralis had been sacrificed from the previous surgery.  Biceps had been sacrificed in the previous surgery.  Was able to identify the bicipital groove and opened this subperiosteally taking a full layer of remnant subscapularis as well as capsule.  At that point I was able to  expose the implants which were clearly visible with significant inferior calcar resorption.  Was able to then externally rotate the shoulder and stay directly on bone and implant to avoid damage to neurovascular structures.  I then dislocated the joint.  There is significant unhealthy appearing fluid however there was no gross purulence.  The cartilage was somewhat murky.  Took multiple specimens into the synovial biopsy.  We then were able to perform a synovectomy of the anterior joint.  I was able to use a flexible osteotome to pass around our stem and remove the stem without issue.  There was no obvious fracture.  Is happy with the removal and explantation of the stem.  We did provisionally place it back for protection of the humeral side while retracting to the glenoid.  Upon examination of the glenoid there was a glenosphere that did not have a setscrew.  It took a significant amount of time to obtain an appropriate device to remove it however I was able to use a cement removal tool with a reverse curette.  At this point we are able to expose the baseplate.  It was the older style Arthrex universal baseplate that was oblong in shape.  Removing the superior screw was relatively routine however the inferior screw broke as we were removing it.  The T15 driver was used  to remove the center screw however the screwdriver itself fractured off within the head of the screw creating a cold welded smooth surface.  I used multiple instruments to try and remove the screw head including tried to drill it out however after extended period time it was clear that this was not going to work.  The dense metal of the screw head was buried within the screw and was not able to be removed.  I thus decided I had to use a metal cutting bur to remove the entirety of the screw head to then free the baseplate.  This an extensive amount of time to remove this hardware and made the case extremely difficult compared to standard.  I  used sterile lubricating jelly in the incision to try and collect metal fragments.  Carefully suctioned and washed as we went to avoid metal debris.  At this point I had removed the screw head and the baseplate was able to be removed with osteotomes and a reverse curette.  I used a trap finder remove the center screw.  The inferior screw was broken deep within the bone was left as I was worried that I would destroyed.  On contained glenoid defect.  At this point he had a 10 mm glenoid defect centrally but had lined aligned fit with the central boss of the Tornier baseplate.  I selected a 25+3 baseplate with a long center post and drilled for this.  This was placed with 4 peripheral screws.  I then completed a synovectomy of the posterior capsule and noted there is no draining sinus tract.  We then were able to place Prontosan solution.  I was able to turn my attention back to the humerus.  We exposed the humerus and sized with the revive system after clearing an inferior pedestal.  13 trial was used and had reasonable fit and stability.  Final construct was created.  We used #5 FiberWire placed around the tuberosity to try and protect it as there is missing medial calcar and I did not want to have a tuberosity fracture.  I was then able to secure our final implants and reduced the joint.  Final construct was stable throughout range of motion.  Used the FiberWire's to repair anterior capsular remnant.  It was relatively tight but I would help it helps with anterior instability.  We irrigated copiously closed incision with multilayer fashion finishing nonabsorbable suture.  Sterile dressing was placed.  Patient awoken taken PACU in stable condition.   Aleck Stalling, PA-C, present and scrubbed throughout the case, critical for completion in a timely fashion, and for retraction, instrumentation, closure.

## 2023-10-06 NOTE — Plan of Care (Signed)
   Problem: Coping: Goal: Level of anxiety will decrease Outcome: Progressing   Problem: Safety: Goal: Ability to remain free from injury will improve Outcome: Progressing   Problem: Skin Integrity: Goal: Risk for impaired skin integrity will decrease Outcome: Progressing

## 2023-10-06 NOTE — Plan of Care (Signed)
  Problem: Education: Goal: Knowledge of General Education information will improve Description: Including pain rating scale, medication(s)/side effects and non-pharmacologic comfort measures Outcome: Progressing   Problem: Health Behavior/Discharge Planning: Goal: Ability to manage health-related needs will improve Outcome: Progressing   Problem: Clinical Measurements: Goal: Ability to maintain clinical measurements within normal limits will improve Outcome: Progressing Goal: Will remain free from infection Outcome: Progressing Goal: Diagnostic test results will improve Outcome: Progressing Goal: Respiratory complications will improve Outcome: Progressing Goal: Cardiovascular complication will be avoided Outcome: Progressing   Problem: Activity: Goal: Risk for activity intolerance will decrease Outcome: Progressing   Problem: Nutrition: Goal: Adequate nutrition will be maintained Outcome: Completed/Met   Problem: Coping: Goal: Level of anxiety will decrease Outcome: Progressing   Problem: Elimination: Goal: Will not experience complications related to bowel motility Outcome: Progressing Goal: Will not experience complications related to urinary retention Outcome: Completed/Met   Problem: Pain Managment: Goal: General experience of comfort will improve and/or be controlled Outcome: Progressing   Problem: Safety: Goal: Ability to remain free from injury will improve Outcome: Progressing   Problem: Skin Integrity: Goal: Risk for impaired skin integrity will decrease Outcome: Progressing   Problem: Nutritional: Goal: Maintenance of adequate nutrition will improve Outcome: Completed/Met   Problem: Skin Integrity: Goal: Risk for impaired skin integrity will decrease Outcome: Progressing   Problem: Tissue Perfusion: Goal: Adequacy of tissue perfusion will improve Outcome: Progressing   Problem: Education: Goal: Knowledge of the prescribed therapeutic regimen  will improve Outcome: Progressing Goal: Understanding of activity limitations/precautions following surgery will improve Outcome: Progressing Goal: Individualized Educational Video(s) Outcome: Completed/Met   Problem: Activity: Goal: Ability to tolerate increased activity will improve Outcome: Progressing   Problem: Pain Management: Goal: Pain level will decrease with appropriate interventions Outcome: Progressing

## 2023-10-07 ENCOUNTER — Other Ambulatory Visit: Payer: Self-pay

## 2023-10-07 DIAGNOSIS — L409 Psoriasis, unspecified: Secondary | ICD-10-CM

## 2023-10-07 DIAGNOSIS — Z96611 Presence of right artificial shoulder joint: Secondary | ICD-10-CM

## 2023-10-07 DIAGNOSIS — T8459XA Infection and inflammatory reaction due to other internal joint prosthesis, initial encounter: Principal | ICD-10-CM

## 2023-10-07 LAB — GLUCOSE, CAPILLARY
Glucose-Capillary: 129 mg/dL — ABNORMAL HIGH (ref 70–99)
Glucose-Capillary: 197 mg/dL — ABNORMAL HIGH (ref 70–99)
Glucose-Capillary: 146 mg/dL — ABNORMAL HIGH (ref 70–99)
Glucose-Capillary: 165 mg/dL — ABNORMAL HIGH (ref 70–99)

## 2023-10-07 LAB — C-REACTIVE PROTEIN: CRP: 1.7 mg/dL — ABNORMAL HIGH (ref <1.0)

## 2023-10-07 MED ORDER — DAPTOMYCIN-SODIUM CHLORIDE 700-0.9 MG/100ML-% IV SOLN
8.0000 mg/kg | Freq: Every day | INTRAVENOUS | Status: DC
  Administered 2023-10-07 – 2023-10-08 (×2): 700 mg via INTRAVENOUS
  Filled 2023-10-07 (×2): qty 100

## 2023-10-07 NOTE — Progress Notes (Incomplete)
 Pharmacy Antibiotic Note  Shaun Velasquez is a 63 y.o. male admitted on 10/06/2023 with {Indications:3041527}.  Pharmacy has been consulted for Daptomycin  dosing.  Plan: {Assessment:21075}  Height: 5' 9 (175.3 cm) Weight: 90 kg (198 lb 6.6 oz) IBW/kg (Calculated) : 70.7  Temp (24hrs), Avg:97.7 F (36.5 C), Min:97.5 F (36.4 C), Max:98.1 F (36.7 C)  No results for input(s): WBC, CREATININE, LATICACIDVEN, VANCOTROUGH, VANCOPEAK, VANCORANDOM, GENTTROUGH, GENTPEAK, GENTRANDOM, TOBRATROUGH, TOBRAPEAK, TOBRARND, AMIKACINPEAK, AMIKACINTROU, AMIKACIN in the last 168 hours.  Estimated Creatinine Clearance: 96.2 mL/min (by C-G formula based on SCr of 0.86 mg/dL).    No Known Allergies  Antimicrobials this admission: *** *** >> *** *** *** >> ***  Dose adjustments this admission: ***  Microbiology results: *** BCx: *** *** UCx: ***  *** Sputum: ***  *** MRSA PCR: ***  Thank you for allowing pharmacy to be a part of this patient's care.  Krikor Willet L Koni Kannan 10/07/2023 2:31 PM

## 2023-10-07 NOTE — Plan of Care (Signed)

## 2023-10-07 NOTE — Progress Notes (Signed)
 Pharmacy Antibiotic Note  Shaun Velasquez is a 63 y.o. 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 X ray showed implant with signs of notching with suspicion of infection. Surgery completed revision of shoulder arthroplasty and tissue culture was collected. Pharmacy has been consulted for daptomycin  dosing.  Plan: - Start daptomycin  700 mg q 24 hrs  - continue ceftriaxone  2 g q 24 hrs  - check baseline CK in the AM, then weekly CK - monitor renal function, culture results, and length of therapy.   Height: 5' 9 (175.3 cm) Weight: 90 kg (198 lb 6.6 oz) IBW/kg (Calculated) : 70.7  Temp (24hrs), Avg:97.7 F (36.5 C), Min:97.5 F (36.4 C), Max:98.1 F (36.7 C)  No results for input(s): WBC, CREATININE, LATICACIDVEN, VANCOTROUGH, VANCOPEAK, VANCORANDOM, GENTTROUGH, GENTPEAK, GENTRANDOM, TOBRATROUGH, TOBRAPEAK, TOBRARND, AMIKACINPEAK, AMIKACINTROU, AMIKACIN in the last 168 hours.  Estimated Creatinine Clearance: 96.2 mL/min (by C-G formula based on SCr of 0.86 mg/dL).    No Known Allergies  Antimicrobials this admission:  - Cefazolin  9/24 - tobramycin  9/24  - vancomycin  9/24  -  ceftriaxone  9/25 >>  Microbiology results: 9/24 Soft tissue (R shoulder) Cx: pending   Thank you for allowing pharmacy to be a part of this patient's care.  Damien Henry PharmD Candidate Class of 2026 [facility] 10/07/2023 2:45 PM

## 2023-10-07 NOTE — Progress Notes (Signed)
   ORTHOPAEDIC PROGRESS NOTE  s/p Procedure(s): RIGHT REVISION TOTAL SHOULDER ARTHROPLASTY  SUBJECTIVE: Reports mild pain about operative site.   No chest pain. No SOB. No nausea/vomiting. No other complaints.  OBJECTIVE: PE: General: resting in hospital bed, NAD RUE: Dressing CDI and sling well fitting,  full and painless ROM throughout hand with DPC of 0.  Axillary nerve sensation/motor altered in setting of block and unable to be fully tested.  Distal motor and sensory altered in setting of block.   Vitals:   10/07/23 0210 10/07/23 0610  BP: 91/67 100/66  Pulse: 82 81  Resp:    Temp: 97.9 F (36.6 C) (!) 97.5 F (36.4 C)  SpO2: 98% 98%    Opiates Today (MME): Today's  total administered Morphine Milligram Equivalents: 0 Opiates Yesterday (MME): Yesterday's total administered Morphine Milligram Equivalents: 75 Opiates Used in the last two days:  Inpatient Morphine Milligram Equivalents Per Day 9/24 - 9/25   Values displayed are in units of MME/Day    Order Start / End Date Yesterday Today    fentaNYL  (SUBLIMAZE ) injection 50-100 mcg 9/24 - 9/24 15 of 15-30 --    fentaNYL  (SUBLIMAZE ) injection 25-50 mcg 9/24 - 9/24 15 of 45-90 --    fentaNYL  (SUBLIMAZE ) injection 9/24 - 9/24 *15 of 15 --    HYDROmorphone  (DILAUDID ) injection 0.5-1 mg 9/24 - No end date 0 of 20-40 0 of 60-120    oxyCODONE  (Oxy IR/ROXICODONE ) immediate release tablet 5-10 mg 9/24 - No end date 15 of 15-30 0 of 45-90    oxyCODONE  (Oxy IR/ROXICODONE ) immediate release tablet 10-15 mg 9/24 - No end date 15 of 30-45 0 of 90-135    Daily Totals  * 75 of 140-250 0 of 195-345  *One-Step medication    Stable post-op images.   ASSESSMENT: Shaun Velasquez is a 63 y.o. male POD#1  PLAN: Weightbearing: NWB RUE in sling Insicional and dressing care: Reinforce dressings as needed Orthopedic device(s): Sling Showering: Post-op day #2 VTE prophylaxis: Aspirin  81 mg BID Pain control: PRN pain medications, minimize  narcotics as able Follow - up plan: 1 week in office Dispo: TBD. ID consultation today. Will need plan for discharge antibiotics.  Contact information:  Weekdays 8-5 Dr. Bonner Hair, Aleck Stalling PA-C, After hours and holidays please check [email or web address] for group call information for [facility]  Aleck Stalling, PA-C 10/07/23

## 2023-10-07 NOTE — Consult Note (Signed)
 NAME: Shaun Velasquez  DOB: 04/16/1960  MRN: [id number]  Date/Time: 10/07/2023 11:55 AM  REQUESTING PROVIDER Dr. Cristy Subjective:  REASON FOR CONSULT: Right shoulder prosthetic joint infection ? DELVIS KAU is a 63 y.o. male with a history of reverse right shoulder arthroplasty in 2016, history of left shoulder arthroplasty, psoriasis on [place]ri[place], diabetes mellitus, hypothyroidism, hyperlipidemia Was admitted on 10/05/2023 for shoulder surgery. Patient has a complicated history He initially underwent right shoulder reverse arthroplasty in 2016. He was doing well for some time until he noticed a swelling on the back of his right arm.  It was lanced multiple times thinking it was an abscess but no cultures were ever sent Then in 11/27/2022 the subcutaneous cystic lesion in the right upper arm was excised by Dr. Signe at [facility]  As per the surgeon note bacterial, fungal, AFB cultures were sent and it was negative.  But I could not find any Results in care everywhere.  As per the surgeon note pathology revealed benign skin with dermal acute and chronic inflammation and fibrosis consistent with a resolving cyst.  It healed up after that surgery until June 2025 when there was recurrent inflammation in the right axillary and back of the upper arm. He saw the surgeon again who did an MRI of the shoulder in July 2025.  That showed small joint effusion and a mild amount of bursal fluid.  And an ovoid fluid collection extending medially from the proximal metaphysis of the prosthesis and this dissected inferiorly as well.  It was 4 cm in craniocaudal dimension.  Findings concerning for hardware failure with either abscess or small particle disease.  And he was referred h to  orthopedic doctor because of possibility of a joint infection.  In September patient had presented to the ED in urgent care with abscess-like area on the nape of the neck  He was prescribed doxycycline .  Then he was referred to dermatology who  incised it and sent for culture and said it is not known MRSA or staph. I do not have any of those results He was again placed on clindamycin  and then went back on doxycycline  Meanwhile Dr. Cristy who he had seen earlier brought him to the OR to do 1 stage shoulder revision.  He was taken to surgery on 10/06/2023.  As per Dr. Raguel note the patient's case was extraordinarily difficult.  Because the the old implants from the screw heads broke off.  It was difficult extraction of the metal head and a piece of the hardware was left behind.  Also sterile lubricating jelly was used to collect metal fragments and the area was carefully suctioned and washed to avoid metal debris.  I put Dr. Cristy he was appropriate for single-stage revision arthroplasty based on the criteria for the shoulder as there was no gross purulence but there was unhealthy appearing tissue and significant bone resorption concerning for infection.  Multiple specimens were sent for culture.  I am asked to see the patient for antibiotic management Patient has not had any fever He takes [place] which is an IL 16 inhibitor for psoriasis- injectable His last A1c 7.1  Past Medical History:  Diagnosis Date   Allergy    Arthritis    Collapsed lung    Diabetes mellitus without complication (HCC)    Eczema    GERD (gastroesophageal reflux disease)    Heart murmur    as child   History of chickenpox    Hyperlipemia    Hypothyroidism  PONV (postoperative nausea and vomiting)    Psoriasis    Uncontrolled diabetes mellitus type 2 without complications 05/19/2015    Past Surgical History:  Procedure Laterality Date   CHEST TUBE INSERTION     pt had collapsed lung   COLONOSCOPY  2011   on [address], not sure name of the practice(normal)   COLONOSCOPY W/ POLYPECTOMY     CYST REMOVAL HAND     arm; 2024   ELBOW SURGERY Right 2009   EXCISION OF KELOID Right 11/27/2022   Procedure: EXCISION OF SUBCUTANEOUS CYST RIGHT UPPER  ARM;  Surgeon: Signe Mitzie LABOR, MD;  Location: [facility];  Service: General;  Laterality: Right;   INCISION AND DRAINAGE PERIRECTAL ABSCESS     left shoulder replacement      2022   REVERSE SHOULDER ARTHROPLASTY Right 12/27/2014   Procedure: RIGHT REVERSE SHOULDER ARTHROPLASTY;  Surgeon: Franky Pointer, MD;  Location: [facility] OR;  Service: Orthopedics;  Laterality: Right;   ROTATOR CUFF REPAIR Right 2011   SHOULDER ARTHROSCOPY W/ LABRAL REPAIR Right    SHOULDER ARTHROSCOPY WITH ROTATOR CUFF REPAIR Left 2003   SHOULDER ARTHROSCOPY WITH SUBACROMIAL DECOMPRESSION Left    WISDOM TOOTH EXTRACTION      Social History   Socioeconomic History   Marital status: Married    Spouse name: Not on file   Number of children: 2   Years of education: Not on file   Highest education level: Bachelor's degree (e.g., BA, AB, BS)  Occupational History   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  Tobacco Use   Smoking status: Former    Current packs/day: 0.00    Types: Cigarettes    Start date: 1981    Quit date: 1996    Years since quitting: 29.7    Passive exposure: Never   Smokeless tobacco: Never  Vaping Use   Vaping status: Never Used  Substance and Sexual Activity   Alcohol use: No   Drug use: No   Sexual activity: Yes  Other Topics Concern   Not on file  Social History Narrative   Not on file   Social Drivers of Health   Financial Resource Strain: Low Risk  (07/05/2023)   Overall Financial Resource Strain (CARDIA)    Difficulty of Paying Living Expenses: Not very hard  Food Insecurity: No Food Insecurity (10/06/2023)   Hunger Vital Sign    Worried About Running Out of Food in the Last Year: Never true    Ran Out of Food in the Last Year: Never true  Transportation Needs: No Transportation Needs (10/06/2023)   PRAPARE - Administrator, Civil Service (Medical): No    Lack of Transportation (Non-Medical): No  Physical Activity: Insufficiently Active  (07/05/2023)   Exercise Vital Sign    Days of Exercise per Week: 4 days    Minutes of Exercise per Session: 30 min  Stress: No Stress Concern Present (07/05/2023)   [employer] of Occupational Health - Occupational Stress Questionnaire    Feeling of Stress: Not at all  Social Connections: Socially Integrated (10/06/2023)   Social Connection and Isolation Panel    Frequency of Communication with Friends and Family: More than three times a week    Frequency of Social Gatherings with Friends and Family: More than three times a week    Attends Religious Services: More than 4 times per year    Active Member of [employer] or Organizations: Yes    Attends Club or  Organization Meetings: More than 4 times per year    Marital Status: Married  Catering manager Violence: Not At Risk (10/06/2023)   Humiliation, Afraid, Rape, and Kick questionnaire    Fear of Current or Ex-Partner: No    Emotionally Abused: No    Physically Abused: No    Sexually Abused: No    Family History  Problem Relation Age of Onset   Allergic rhinitis Mother    Lung cancer Mother    Asthma Sister    Cancer Sister        Skin   Colon cancer Neg Hx    Colon polyps Neg Hx    Esophageal cancer Neg Hx    Stomach cancer Neg Hx    Rectal cancer Neg Hx    No Known Allergies I? Current Facility-Administered Medications  Medication Dose Route Frequency Provider Last Rate Last Admin   acetaminophen  (TYLENOL ) tablet 1,000 mg  1,000 mg Oral Q6H McBane, Caroline N, PA-C   1,000 mg at 10/07/23 0700   albuterol  (PROVENTIL ) (2.5 MG/3ML) 0.083% nebulizer solution 3 mL  3 mL Inhalation Q6H PRN McBane, Aleck SAILOR, PA-C       bisacodyl  (DULCOLAX) suppository 10 mg  10 mg Rectal Daily PRN McBane, Caroline N, PA-C       cefTRIAXone  (ROCEPHIN ) 2 g in sodium chloride  0.9 % 100 mL IVPB  2 g Intravenous Q24H McBane, Caroline N, PA-C       celecoxib  (CELEBREX ) capsule 200 mg  200 mg Oral BID McBane, Caroline N, PA-C   200 mg at 10/07/23 1013    diphenhydrAMINE  (BENADRYL ) 12.5 MG/5ML elixir 12.5-25 mg  12.5-25 mg Oral Q4H PRN McBane, Caroline N, PA-C       docusate sodium  (COLACE) capsule 100 mg  100 mg Oral BID McBane, Caroline N, PA-C   100 mg at 10/07/23 1013   HYDROmorphone  (DILAUDID ) injection 0.5-1 mg  0.5-1 mg Intravenous Q4H PRN McBane, Caroline N, PA-C       insulin  aspart (novoLOG ) injection 0-15 Units  0-15 Units Subcutaneous TID WC McBane, Caroline N, PA-C   2 Units at 10/07/23 0830   insulin  aspart (novoLOG ) injection 0-5 Units  0-5 Units Subcutaneous QHS McBane, Caroline N, PA-C       levothyroxine  (SYNTHROID ) tablet 150 mcg  150 mcg Oral QAC breakfast McBane, Caroline N, PA-C   150 mcg at 10/07/23 0700   magnesium  citrate solution 1 Bottle  1 Bottle Oral Once PRN McBane, Caroline N, PA-C       menthol  (CEPACOL) lozenge 3 mg  1 lozenge Oral PRN McBane, Caroline N, PA-C       Or   phenol (CHLORASEPTIC) mouth spray 1 spray  1 spray Mouth/Throat PRN McBane, Caroline N, PA-C       methocarbamol  (ROBAXIN ) tablet 500 mg  500 mg Oral Q6H PRN McBane, Caroline N, PA-C   500 mg at 10/07/23 0211   Or   methocarbamol  (ROBAXIN ) injection 500 mg  500 mg Intravenous Q6H PRN McBane, Caroline N, PA-C   500 mg at 10/06/23 1712   metoCLOPramide  (REGLAN ) tablet 5-10 mg  5-10 mg Oral Q8H PRN McBane, Caroline N, PA-C       Or   metoCLOPramide  (REGLAN ) injection 5-10 mg  5-10 mg Intravenous Q8H PRN McBane, Caroline N, PA-C       ondansetron  (ZOFRAN ) tablet 4 mg  4 mg Oral Q6H PRN McBane, Caroline N, PA-C       Or   ondansetron  (ZOFRAN ) injection 4  mg  4 mg Intravenous Q6H PRN McBane, Aleck SAILOR, PA-C       oxyCODONE  (Oxy IR/ROXICODONE ) immediate release tablet 10-15 mg  10-15 mg Oral Q4H PRN Jennye Aleck SAILOR, PA-C   10 mg at 10/06/23 1833   oxyCODONE  (Oxy IR/ROXICODONE ) immediate release tablet 5-10 mg  5-10 mg Oral Q4H PRN McBane, Caroline N, PA-C   10 mg at 10/06/23 2235   polyethylene glycol (MIRALAX  / GLYCOLAX ) packet 17 g  17 g Oral Daily  PRN McBane, Aleck SAILOR, PA-C       rosuvastatin  (CRESTOR ) tablet 20 mg  20 mg Oral Daily McBane, Caroline N, PA-C   20 mg at 10/07/23 1013   tamsulosin  (FLOMAX ) capsule 0.4 mg  0.4 mg Oral Daily McBane, Caroline N, PA-C   0.4 mg at 10/07/23 1013   zolpidem  (AMBIEN ) tablet 5 mg  5 mg Oral QHS PRN,MR X 1 McBane, Caroline N, PA-C         Abtx:  Anti-infectives (From admission, onward)    Start     Dose/Rate Route Frequency Ordered Stop   10/07/23 1200  cefTRIAXone  (ROCEPHIN ) 2 g in sodium chloride  0.9 % 100 mL IVPB        2 g 200 mL/hr over 30 Minutes Intravenous Every 24 hours 10/06/23 1803     10/06/23 1409  tobramycin  (NEBCIN ) powder  Status:  Discontinued          As needed 10/06/23 1409 10/06/23 1643   10/06/23 1400  vancomycin  (VANCOCIN ) powder  Status:  Discontinued          As needed 10/06/23 1400 10/06/23 1643   10/06/23 1045  ceFAZolin  (ANCEF ) IVPB 2g/100 mL premix        2 g 200 mL/hr over 30 Minutes Intravenous On call to O.R. 10/06/23 1044 10/06/23 1331       REVIEW OF SYSTEMS:  Const: negative fever, negative chills, negative weight loss Eyes: negative diplopia or visual changes, negative eye pain ENT: negative coryza, negative sore throat Resp: negative cough, hemoptysis, dyspnea Cards: negative for chest pain, palpitations, lower extremity edema GU: negative for frequency, dysuria and hematuria GI: Negative for abdominal pain, diarrhea, bleeding, constipation Skin: negative for rash and pruritus Heme: negative for easy bruising and gum/nose bleeding MS: as above Neurolo:negative for headaches, dizziness, vertigo, memory problems  Psych: negative for feelings of anxiety, depression  Endocrine: hypothyroid, diabetes Allergy/Immunology- negative for any medication or food allergies ?  Objective:  VITALS:  BP 100/66 (BP Location: Left Arm)   Pulse 81   Temp (!) 97.5 F (36.4 C) (Oral)   Resp 16   Ht 5' 9 (1.753 m)   Wt 90 kg   SpO2 98%   BMI 29.30 kg/m   LDA Foley Central line Other drainage tubes PHYSICAL EXAM:  General: Alert, cooperative, no distress, appears stated age.  Head: Normocephalic, without obvious abnormality, atraumatic. Eyes: Conjunctivae clear, anicteric sclerae. Pupils are equal ENT Nares normal. No drainage or sinus tenderness. Lips, mucosa, and tongue normal. No Thrush Neck: Supple, symmetrical, no adenopathy, thyroid : non tender no carotid bruit and no JVD. Back: No CVA tenderness. Lungs: Clear to auscultation bilaterally. No Wheezing or Rhonchi. No rales. Heart: Regular rate and rhythm, no murmur, rub or gallop. Abdomen: Soft, non-tender,not distended. Bowel sounds normal. No masses Extremities: Right arm in sling Surgical dressing not removed A keloid scar seen on the back of the arm  Left shoulder surgical scar healed very well  Skin: No rashes or lesions.  Or bruising Lymph: Cervical, supraclavicular normal. Neurologic: Grossly non-focal Pertinent Labs Lab Results CBC    Component Value Date/Time   WBC 7.5 09/23/2023 1833   RBC 5.12 09/23/2023 1833   HGB 14.2 09/23/2023 1833   HCT 42.4 09/23/2023 1833   PLT 243 09/23/2023 1833   MCV 82.8 09/23/2023 1833   MCH 27.7 09/23/2023 1833   MCHC 33.5 09/23/2023 1833   RDW 14.4 09/23/2023 1833   LYMPHSABS 1.6 09/23/2023 1833   MONOABS 0.3 09/23/2023 1833   EOSABS 0.2 09/23/2023 1833   BASOSABS 0.0 09/23/2023 1833       Latest Ref Rng & Units 09/23/2023    6:33 PM 07/05/2023   11:29 AM 12/18/2022    9:42 AM  CMP  Glucose 70 - 99 mg/dL 838  875  867   BUN 8 - 23 mg/dL 17   25   Creatinine 9.38 - 1.24 mg/dL 9.13   9.07   Sodium 864 - 145 mmol/L 133   136   Potassium 3.5 - 5.1 mmol/L 3.7   4.3   Chloride 98 - 111 mmol/L 95   99   CO2 22 - 32 mmol/L 22   27   Calcium  8.9 - 10.3 mg/dL 9.7   9.6       Microbiology: Recent Results (from the past 240 hours)  Surgical pcr screen     Status: None   Collection Time: 10/01/23 12:18 PM   Specimen:  Nasal Mucosa; Nasal Swab  Result Value Ref Range Status   MRSA, PCR NEGATIVE NEGATIVE Final   Staphylococcus aureus NEGATIVE NEGATIVE Final    Comment: (NOTE) The Xpert SA Assay (FDA approved for NASAL specimens in patients 49 years of age and older), is one component of a comprehensive surveillance program. It is not intended to diagnose infection nor to guide or monitor treatment. Performed at [facility], [address] [place], [place] [address]   Aerobic/Anaerobic Culture w Gram Stain (surgical/deep wound)     Status: None (Preliminary result)   Collection Time: 10/06/23  2:04 PM   Specimen: Soft Tissue, Other  Result Value Ref Range Status   Specimen Description TISSUE  Final   Special Requests RIGHT SHOULDER 1 hold for 2wks  Final   Gram Stain NO WBC SEEN NO ORGANISMS SEEN   Final   Culture   Final    NO GROWTH < 24 HOURS Performed at [facility], [address] [place], [place] [address]    Report Status PENDING  Incomplete  Aerobic/Anaerobic Culture w Gram Stain (surgical/deep wound)     Status: None (Preliminary result)   Collection Time: 10/06/23  2:06 PM   Specimen: Soft Tissue, Other  Result Value Ref Range Status   Specimen Description TISSUE  Final   Special Requests RIGHT SHOULDER 2 hold 2wks``  Final   Gram Stain NO WBC SEEN NO ORGANISMS SEEN   Final   Culture   Final    NO GROWTH < 24 HOURS Performed at [facility], [address] [place], [place] [address]    Report Status PENDING  Incomplete  Aerobic/Anaerobic Culture w Gram Stain (surgical/deep wound)     Status: None (Preliminary result)   Collection Time: 10/06/23  2:08 PM   Specimen: Soft Tissue, Other  Result Value Ref Range Status   Specimen Description TISSUE  Final   Special Requests RIGHT SHOULDER 3 hold 2 weeks  Final   Gram Stain NO WBC SEEN NO  ORGANISMS SEEN   Final   Culture   Final    NO GROWTH < 24 HOURS Performed at [facility],  [address], [place], [place] [address]    Report Status PENDING  Incomplete    IMAGING RESULTS: I reviewed the MRI from July 2025.  Please see the report above I have personally reviewed the films ? 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  Would cover with daptomycin  and ceftriaxone . Await cultures Will need up to 12 weeks of antibiotics.  After 6 weeks he will go on p.o. antibiotic But if there is Propionibacterium acnes in the culture he may need for a longer period of time Await culture result report to decide on the duration of antibiotic  PICC line will be placed  Diabetes mellitus on insulin  in the hospital  Hypothyroidism on levothyroxine   Psoriasis on IL 16 inhibitor injection  Discussed the management with the patient and his wife at bedside This consult involved complex antimicrobial management.    I have personally spent  -75--minutes involved in face-to-face and non-face-to-face activities for this patient on the day of the visit. Professional time spent includes the following activities: Preparing to see the patient (review of tests), Obtaining and/or reviewing separately obtained history (admission/discharge record), Performing a medically appropriate examination and/or evaluation , Ordering medications/tests/procedures, referring and communicating with other health care professionals, Documenting clinical information in the EMR, Independently interpreting  results (not separately reported), Communicating results to the patient/family/caregiver, Counseling and educating the patient/family/caregiver and Care coordination (not separately reported).    ________________________________________________ Discussed with  requesting provider Note:  This document was prepared using Dragon voice recognition software and may include unintentional dictation errors.

## 2023-10-07 NOTE — Progress Notes (Incomplete)
 PHARMACY CONSULT NOTE FOR:  OUTPATIENT  PARENTERAL ANTIBIOTIC THERAPY (OPAT)  Indication: R-shoulder PJI Regimen: Daptomycin  700 mg IV every 24 hours and Rocephin  2g IV every 24 hours End date: 11/16/23  IV antibiotic discharge orders are pended. To discharging provider:  please sign these orders via discharge navigator,  Select New Orders & click on the button choice - Manage This Unsigned Work.     Thank you for allowing pharmacy to be a part of this patient's care.  Almarie Lunger, PharmD, BCPS, BCIDP Infectious Diseases Clinical Pharmacist 10/07/2023 3:52 PM   **Pharmacist phone directory can now be found on [email or web address] ([email or web address]).  Listed under [facility].

## 2023-10-07 NOTE — Progress Notes (Signed)
 PHARMACY CONSULT NOTE FOR:  OUTPATIENT  PARENTERAL ANTIBIOTIC THERAPY (OPAT)  Indication: PJI (R-shoulder) Regimen: Ceftriaxone  2g IV every 24 hours and Daptomycin  700mg  (8mg /kg) IV every 24 hours End date: 11/16/2023  IV antibiotic discharge orders are pended. To discharging provider:  please sign these orders via discharge navigator,  Select New Orders & click on the button choice - Manage This Unsigned Work.     Thank you for allowing pharmacy to be a part of this patient's care.  Dionicia Canavan, PharmD, RPh PGY1 Acute Care Pharmacy Resident [facility]  10/08/2023 9:58 AM

## 2023-10-07 NOTE — TOC Transition Note (Signed)
 Transition of Care [facility]) - Discharge Note   Patient Details  Name: Shaun Velasquez MRN: [id number] Date of Birth: [birth year]  [facility] of Care [facility]) CM/SW Contact:  Alfonse JONELLE Rex, RN Phone Number: 10/07/2023, 9:32 AM   Clinical Narrative:  Patient s/p reverse total arthroplasty of right shoulder, dc therapy OPPT, ice machine at bedside. No TOC needs.      Final next level of care: OP Rehab Barriers to Discharge: No Barriers Identified   Patient Goals and CMS Choice Patient states their goals for this hospitalization and ongoing recovery are:: return home          Discharge Placement                       Discharge Plan and Services Additional resources added to the After Visit Summary for                                       Social Drivers of Health (SDOH) Interventions SDOH Screenings   Food Insecurity: No Food Insecurity (10/06/2023)  Housing: Low Risk  (10/06/2023)  Transportation Needs: No Transportation Needs (10/06/2023)  Utilities: Not At Risk (10/06/2023)  Depression (PHQ2-9): Low Risk  (07/05/2023)  Financial Resource Strain: Low Risk  (07/05/2023)  Physical Activity: Insufficiently Active (07/05/2023)  Social Connections: Socially Integrated (10/06/2023)  Stress: No Stress Concern Present (07/05/2023)  Tobacco Use: Medium Risk (10/06/2023)     Readmission Risk Interventions    10/07/2023    9:31 AM  Readmission Risk Prevention Plan  Post Dischage Appt Complete  Medication Screening Complete  Transportation Screening Complete

## 2023-10-07 NOTE — Treatment Plan (Signed)
 Diagnosis: Prosthetic joint infection shoulder Baseline Creatinine 0.86  Culture Result: NA  No Known Allergies  OPAT Orders Ceftriaxone  2 grams Iv every 24 hours Daptomycin  700mg  ( 8mg /kg) IV every 24 hours Duration: 6 weeks End Date: 11/16/23  [facility] Care Per Protocol:  Labs weekly while on IV antibiotics: X__ CBC with differential _X_ CK _X_ CMP X__ CRP _x_ ESR  _X_ Please pull PIC at completion of IV antibiotics   Fax weekly lab results  promptly to [phone] &  [phone]  Clinic Follow Up Appt:10/17/23 at 2.30 pm with Dr.Eldon Zietlow   Call [phone] with critical value or questions

## 2023-10-08 LAB — CK: Total CK: 291 U/L (ref 49–397)

## 2023-10-08 LAB — GLUCOSE, CAPILLARY
Glucose-Capillary: 145 mg/dL — ABNORMAL HIGH (ref 70–99)
Glucose-Capillary: 264 mg/dL — ABNORMAL HIGH (ref 70–99)

## 2023-10-08 MED ORDER — ASPIRIN 81 MG PO CHEW: 81.0000 mg | CHEWABLE_TABLET | Freq: Two times a day (BID) | ORAL | 0 refills | Status: DC

## 2023-10-08 MED ORDER — OXYCODONE HCL 5 MG PO TABS: ORAL_TABLET | ORAL | 0 refills | Status: AC

## 2023-10-08 MED ORDER — CEFTRIAXONE IV (FOR PTA / DISCHARGE USE ONLY): 2.0000 g | INTRAVENOUS | 0 refills | Status: DC

## 2023-10-08 MED ORDER — ACETAMINOPHEN 500 MG PO TABS
1000.0000 mg | ORAL_TABLET | Freq: Three times a day (TID) | ORAL | 0 refills | Status: AC
Start: 2023-10-08 — End: 2023-10-22

## 2023-10-08 MED ORDER — HEPARIN SOD (PORK) LOCK FLUSH 100 UNIT/ML IV SOLN
250.0000 [IU] | INTRAVENOUS | Status: AC | PRN
  Administered 2023-10-08: 250 [IU]

## 2023-10-08 MED ORDER — MELOXICAM 15 MG PO TABS: 15.0000 mg | ORAL_TABLET | Freq: Every day | ORAL | 0 refills | Status: AC

## 2023-10-08 MED ORDER — DAPTOMYCIN IV (FOR PTA / DISCHARGE USE ONLY): 700.0000 mg | INTRAVENOUS | 0 refills | Status: DC

## 2023-10-08 MED ORDER — CHLORHEXIDINE GLUCONATE CLOTH 2 % EX PADS
6.0000 | MEDICATED_PAD | Freq: Every day | CUTANEOUS | Status: DC
Start: 2023-10-08 — End: 2023-10-08
  Administered 2023-10-08: 6 via TOPICAL

## 2023-10-08 MED ORDER — OMEPRAZOLE 20 MG PO CPDR
20.0000 mg | DELAYED_RELEASE_CAPSULE | Freq: Every day | ORAL | 0 refills | Status: AC
Start: 2023-10-08 — End: 2023-12-01

## 2023-10-08 MED ORDER — ONDANSETRON HCL 4 MG PO TABS: 4.0000 mg | ORAL_TABLET | Freq: Three times a day (TID) | ORAL | 0 refills | Status: AC | PRN

## 2023-10-08 MED ORDER — SODIUM CHLORIDE 0.9% FLUSH: 10.0000 mL | INTRAVENOUS | Status: DC | PRN

## 2023-10-08 NOTE — TOC Transition Note (Signed)
 Transition of Care [facility]) - Discharge Note   Patient Details  Name: Shaun Velasquez MRN: [id number] Date of Birth: [birth year]  [facility] CM/SW Contact:  Alfonse JONELLE Rex, RN Phone Number: 10/08/2023, 1:30 PM   Clinical Narrative:  DC to home with IV Abx through [facility]. HH RN arranged with [facility], rep-Artavia accepted referral, added to AVS. No further TOC needs identified at this time.      Final next level of care: Home w Home Health Services Barriers to Discharge: Barriers Resolved   Patient Goals and CMS Choice Patient states their goals for this hospitalization and ongoing recovery are:: return home          Discharge Placement                       Discharge Plan and Services Additional resources added to the After Visit Summary for                            [facility] Arranged: RN [facility] Agency: [facility] Date [facility] Agency Contacted: 10/08/23   Representative spoke with at [facility] Agency: [facility]  Social Drivers of Health (SDOH) Interventions SDOH Screenings   Food Insecurity: No Food Insecurity (10/06/2023)  Housing: Low Risk  (10/06/2023)  Transportation Needs: No Transportation Needs (10/06/2023)  Utilities: Not At Risk (10/06/2023)  Depression (PHQ2-9): Low Risk  (07/05/2023)  Financial Resource Strain: Low Risk  (07/05/2023)  Physical Activity: Insufficiently Active (07/05/2023)  Social Connections: Socially Integrated (10/06/2023)  Stress: No Stress Concern Present (07/05/2023)  Tobacco Use: Medium Risk (10/06/2023)     Readmission Risk Interventions    10/08/2023    1:29 PM 10/07/2023    9:31 AM  Readmission Risk Prevention Plan  Post Dischage Appt Complete Complete  Medication Screening Complete Complete  Transportation Screening Complete Complete

## 2023-10-08 NOTE — Progress Notes (Signed)
   ORTHOPAEDIC PROGRESS NOTE  s/p Procedure(s): RIGHT REVISION TOTAL SHOULDER ARTHROPLASTY  SUBJECTIVE: Doing well, mild pain controlled with oral meds  No chest pain. No SOB. No nausea/vomiting. No other complaints.  OBJECTIVE: PE: General: resting in hospital bed, NAD RUE: Dressing CDI and sling well fitting,  full and painless ROM throughout hand with DPC of 0.  Axillary nerve sensation normal.   Vitals:   10/07/23 2021 10/08/23 0545  BP: 134/84 120/74  Pulse: (!) 101 (!) 101  Resp: 18 18  Temp: 98.7 F (37.1 C) 98.9 F (37.2 C)  SpO2: 97% 95%    Opiates Today (MME): Today's  total administered Morphine Milligram Equivalents: 15 Opiates Yesterday (MME): Yesterday's total administered Morphine Milligram Equivalents: 30 Opiates Used in the last two days:  Inpatient Morphine Milligram Equivalents Per Day 9/24 - 9/26   Values displayed are in units of MME/Day    Order Start / End Date 9/24 Yesterday Today    fentaNYL  (SUBLIMAZE ) injection 50-100 mcg 9/24 - 9/24 15 of 15-30 -- --    fentaNYL  (SUBLIMAZE ) injection 25-50 mcg 9/24 - 9/24 15 of 45-90 -- --    fentaNYL  (SUBLIMAZE ) injection 9/24 - 9/24 *15 of 15 -- --    HYDROmorphone  (DILAUDID ) injection 0.5-1 mg 9/24 - No end date 0 of 20-40 0 of 60-120 0 of 60-120    oxyCODONE  (Oxy IR/ROXICODONE ) immediate release tablet 5-10 mg 9/24 - No end date 15 of 15-30 30 of 45-90 15 of 45-90    oxyCODONE  (Oxy IR/ROXICODONE ) immediate release tablet 10-15 mg 9/24 - No end date 15 of 30-45 0 of 90-135 0 of 90-135    Daily Totals  * 75 of 140-250 30 of 195-345 15 of 195-345  *One-Step medication    Stable post-op images.   ASSESSMENT: Shaun Velasquez is a 63 y.o. male POD#2  OK for discharge when ID plan in place.  PLAN: Weightbearing: NWB RUE in sling Insicional and dressing care: Reinforce dressings as needed Orthopedic device(s): Sling Showering: Post-op day #2 VTE prophylaxis: Aspirin  81 mg BID Pain control: PRN pain  medications, minimize narcotics as able Follow - up plan: 1 week in office Dispo: TBD. ID consultation today. Will need plan for discharge antibiotics.  Contact information:  Weekdays 8-5 Dr. Bonner Hair, Aleck Stalling PA-C, After hours and holidays please check [email or web address] for group call information for [facility]  Aleck Stalling, PA-C 10/07/23

## 2023-10-08 NOTE — Progress Notes (Signed)
 Peripherally Inserted Central Catheter Placement  The IV Nurse has discussed with the patient and/or persons authorized to consent for the patient, the purpose of this procedure and the potential benefits and risks involved with this procedure.  The benefits include less needle sticks, lab draws from the catheter, and the patient may be discharged home with the catheter. Risks include, but not limited to, infection, bleeding, blood clot (thrombus formation), and puncture of an artery; nerve damage and irregular heartbeat and possibility to perform a PICC exchange if needed/ordered by physician.  Alternatives to this procedure were also discussed.  Bard Power PICC patient education guide, fact sheet on infection prevention and patient information card has been provided to patient /or left at bedside.    PICC Placement Documentation  PICC Single Lumen 10/08/23 Left Basilic 47 cm 1 cm (Active)  Indication for Insertion or Continuance of Line Prolonged intravenous therapies 10/08/23 0842  Exposed Catheter (cm) 1 cm 10/08/23 0842  Site Assessment Clean, Dry, Intact 10/08/23 0842  Line Status Flushed;Saline locked;Blood return noted 10/08/23 0842  Dressing Type Transparent;Securing device 10/08/23 0842  Dressing Status Antimicrobial disc/dressing in place;Clean, Dry, Intact 10/08/23 0842  Line Care Connections checked and tightened 10/08/23 0842  Line Adjustment (NICU/IV Team Only) No 10/08/23 0842  Dressing Intervention New dressing;Adhesive placed at insertion site (IV team only) 10/08/23 0842  Dressing Change Due 10/15/23 10/08/23 0842       Jolee Na 10/08/2023, 8:43 AM

## 2023-10-08 NOTE — Discharge Instructions (Signed)
 Bonner Hair MD, MPH Aleck Stalling, PA-C [facility] [address], [address] [phone] (tel)   [phone] (fax)   POST-OPERATIVE INSTRUCTIONS - TOTAL SHOULDER REPLACEMENT    WOUND CARE You may leave the operative dressing in place until your follow-up appointment. KEEP THE INCISIONS CLEAN AND DRY. There may be a small amount of fluid/bleeding leaking at the surgical site. This is normal after surgery.  If it fills with liquid or blood please call us  immediately to change it for you. Use the provided ice machine or Ice packs as often as possible for the first 3-4 days, then as needed for pain relief.   Keep a layer of cloth or a shirt between your skin and the cooling unit to prevent frost bite as it can get very cold.  SHOWERING: - You may shower on Post-Op Day #2.  - The dressing is water  resistant but do not scrub it as it may start to peel up.   - You may remove the sling for showering - Gently pat the area dry.  - Do not soak the shoulder in water .  - Do not go swimming in the pool or ocean until your incision has completely healed (about 4-6 weeks after surgery) - KEEP THE INCISIONS CLEAN AND DRY.  EXERCISES Wear the sling at all times  You may remove the sling for showering, but keep the arm across the chest or in a secondary sling.    Accidental/Purposeful External Rotation and shoulder flexion (reaching behind you) is to be avoided at all costs for the first month. It is ok to come out of your sling if your are sitting and have assistance for eating.   Do not lift anything heavier than 1 pound until we discuss it further in clinic.  It is normal for your fingers/hand to become more swollen after surgery and discolored from bruising.   This will resolve over the first few weeks usually after surgery. Please continue to ambulate and do not stay sitting or lying for too long.  Perform foot and wrist pumps to assist in circulation.  PHYSICAL  THERAPY - No therapy for 4 weeks  REGIONAL ANESTHESIA (NERVE BLOCKS) The anesthesia team may have performed a nerve block for you this is a great tool used to minimize pain.   The block may start wearing off overnight (between 8-24 hours postop) When the block wears off, your pain may go from nearly zero to the pain you would have had postop without the block. This is an abrupt transition but nothing dangerous is happening.   This can be a challenging period but utilize your as needed pain medications to try and manage this period. We suggest you use the pain medication the first night prior to going to bed, to ease this transition.  You may take an extra dose of narcotic when this happens if needed   POST-OP MEDICATIONS- Multimodal approach to pain control In general your pain will be controlled with a combination of substances.  Prescriptions unless otherwise discussed are electronically sent to your pharmacy.  This is a carefully made plan we use to minimize narcotic use.     Meloxicam  - Anti-inflammatory medication taken on a scheduled basis Acetaminophen  - Non-narcotic pain medicine taken on a scheduled basis  Oxycodone  - This is a strong narcotic, to be used only on an "as needed" basis for SEVERE pain. Aspirin  81mg  - This medicine is used to minimize the risk of blood clots after surgery.  Omeprazole  - daily medicine to protect your stomach while taking anti-inflammatories.   Zofran  -  take as needed for nausea   FOLLOW-UP If you develop a Fever (>101.5), Redness or Drainage from the surgical incision site, please call our office to arrange for an evaluation. Please call the office to schedule a follow-up appointment for a wound check, 7-10 days post-operatively.  IF YOU HAVE ANY QUESTIONS, PLEASE FEEL FREE TO CALL OUR OFFICE.  HELPFUL INFORMATION  Your arm will be in a sling following surgery. You will be in this sling for the next 4 weeks.   You may be more comfortable  sleeping in a semi-seated position the first few nights following surgery.  Keep a pillow propped under the elbow and forearm for comfort.  If you have a recliner type of chair it might be beneficial.  If not that is fine too, but it would be helpful to sleep propped up with pillows behind your operated shoulder as well under your elbow and forearm.  This will reduce pulling on the suture lines.  When dressing, put your operative arm in the sleeve first.  When getting undressed, take your operative arm out last.  Loose fitting, button-down shirts are recommended.  In most states it is against the law to drive while your arm is in a sling. And certainly against the law to drive while taking narcotics.  You may return to work/school in the next couple of days when you feel up to it. Desk work and typing in the sling is fine.  We suggest you use the pain medication the first night prior to going to bed, in order to ease any pain when the anesthesia wears off. You should avoid taking pain medications on an empty stomach as it will make you nauseous.  You should wean off your narcotic medicines as soon as you are able.     Most patients will be off narcotics before their first postop appointment.   Do not drink alcoholic beverages or take illicit drugs when taking pain medications.  Pain medication may make you constipated.  Below are a few solutions to try in this order: Decrease the amount of pain medication if you aren't having pain. Drink lots of decaffeinated fluids. Drink prune juice and/or each dried prunes  If the first 3 don't work start with additional solutions Take Colace - an over-the-counter stool softener Take Senokot - an over-the-counter laxative Take Miralax  - a stronger over-the-counter laxative   Dental Antibiotics:  We require dental prophylaxis for 2 years after a shoulder replacement  Contact your surgeon for an antibiotic prescription, prior to your dental  procedure.   For more information including helpful videos and documents visit our website:   [email or web address]

## 2023-10-08 NOTE — Plan of Care (Signed)
  Problem: Education: Goal: Knowledge of General Education information will improve Description: Including pain rating scale, medication(s)/side effects and non-pharmacologic comfort measures Outcome: Progressing   Problem: Health Behavior/Discharge Planning: Goal: Ability to manage health-related needs will improve Outcome: Progressing   Problem: Clinical Measurements: Goal: Ability to maintain clinical measurements within normal limits will improve Outcome: Progressing Goal: Will remain free from infection Outcome: Progressing Goal: Diagnostic test results will improve Outcome: Progressing Goal: Respiratory complications will improve Outcome: Progressing Goal: Cardiovascular complication will be avoided Outcome: Progressing   Problem: Activity: Goal: Risk for activity intolerance will decrease Outcome: Progressing   Problem: Coping: Goal: Level of anxiety will decrease Outcome: Progressing   Problem: Elimination: Goal: Will not experience complications related to bowel motility Outcome: Progressing   Problem: Pain Managment: Goal: General experience of comfort will improve and/or be controlled Outcome: Progressing   Problem: Safety: Goal: Ability to remain free from injury will improve Outcome: Progressing   Problem: Skin Integrity: Goal: Risk for impaired skin integrity will decrease Outcome: Progressing   Problem: Education: Goal: Ability to describe self-care measures that may prevent or decrease complications (Diabetes Survival Skills Education) will improve Outcome: Progressing Goal: Individualized Educational Video(s) Outcome: Progressing   Problem: Coping: Goal: Ability to adjust to condition or change in health will improve Outcome: Progressing   Problem: Fluid Volume: Goal: Ability to maintain a balanced intake and output will improve Outcome: Progressing   Problem: Health Behavior/Discharge Planning: Goal: Ability to identify and utilize  available resources and services will improve Outcome: Progressing Goal: Ability to manage health-related needs will improve Outcome: Progressing   Problem: Metabolic: Goal: Ability to maintain appropriate glucose levels will improve Outcome: Progressing   Problem: Nutritional: Goal: Progress toward achieving an optimal weight will improve Outcome: Progressing   Problem: Skin Integrity: Goal: Risk for impaired skin integrity will decrease Outcome: Progressing   Problem: Tissue Perfusion: Goal: Adequacy of tissue perfusion will improve Outcome: Progressing   Problem: Education: Goal: Knowledge of the prescribed therapeutic regimen will improve Outcome: Progressing Goal: Understanding of activity limitations/precautions following surgery will improve Outcome: Progressing   Problem: Activity: Goal: Ability to tolerate increased activity will improve Outcome: Progressing   Problem: Pain Management: Goal: Pain level will decrease with appropriate interventions Outcome: Progressing

## 2023-10-11 ENCOUNTER — Telehealth: Payer: Self-pay

## 2023-10-11 ENCOUNTER — Encounter (HOSPITAL_COMMUNITY): Payer: Self-pay | Admitting: Orthopaedic Surgery

## 2023-10-11 LAB — AEROBIC/ANAEROBIC CULTURE W GRAM STAIN (SURGICAL/DEEP WOUND)
Culture: NO GROWTH
Culture: NO GROWTH
Culture: NO GROWTH
Gram Stain: NONE SEEN
Gram Stain: NONE SEEN
Gram Stain: NONE SEEN

## 2023-10-11 NOTE — Patient Instructions (Signed)
 Visit Information  Thank you for taking time to visit with me today.   Call MD for: redness, tenderness, or signs of infection (pain, swelling, redness, odor or green/yellow discharge around incision site) Call MD for: severe uncontrolled pain Call MD for: temperature >100.4   Patient verbalizes understanding of instructions and care plan provided today and agrees to view in MyChart. Active MyChart status and patient understanding of how to access instructions and care plan via MyChart confirmed with patient.     The patient has been provided with contact information for the care management team and has been advised to call with any health related questions or concerns.   Please call the care guide team at [phone] if you need to cancel or reschedule your appointment.   Please call the Suicide and Crisis Lifeline: 988 if you are experiencing a Mental Health or Behavioral Health Crisis or need someone to talk to.  Jahred Tatar J. Euriah Matlack RN, MSN [facility], [facility] RN Care Manager Direct Dial: [phone]  Fax: [phone] Website: [email or web address]

## 2023-10-11 NOTE — Transitions of Care (Post Inpatient/ED Visit) (Signed)
 10/11/2023  Name: Shaun Velasquez MRN: [id number] DOB: 1960/09/03  Today's TOC FU Call Status:   Patient's Name and Date of Birth confirmed.  Transition Care Management Follow-up Telephone Call Date of Discharge: 10/08/23 Discharge Facility: Shaun Velasquez [name]) Type of Discharge: Inpatient Admission Primary Inpatient Discharge Diagnosis:: s/p  RIGHT REVISION TOTAL SHOULDER ARTHROPLASTY How have you been since you were released from the hospital?: Better Any questions or concerns?: No  Items Reviewed: Did you receive and understand the discharge instructions provided?: Yes Medications obtained,verified, and reconciled?: Yes (Medications Reviewed) Any new allergies since your discharge?: No Dietary orders reviewed?: Yes Type of Diet Ordered:: Low sodium heart Healthy Carb modified Do you have support at home?: Yes People in Home [RPT]: spouse  Medications Reviewed Today: Medications Reviewed Today     Reviewed by Easton Fetty, RN (Case Manager) on 10/11/23 at 1018  Med List Status: <None>   Medication Order Taking? Sig Documenting Provider Last Dose Status Informant  acetaminophen  (TYLENOL ) 500 MG tablet [id number] Yes Take 2 tablets (1,000 mg total) by mouth every 8 (eight) hours for 14 days. McBane, Caroline N, PA-C  Active   albuterol  (VENTOLIN  HFA) 108 (90 Base) MCG/ACT inhaler [id number] Yes Inhale 2 puffs into the lungs every 6 (six) hours as needed for wheezing or shortness of breath. Marinda Rocky SAILOR, MD  Active Self  aspirin  (ASPIRIN  CHILDRENS) 81 MG chewable tablet [id number] Yes Chew 1 tablet (81 mg total) by mouth 2 (two) times daily. For 6 weeks for DVT prophylaxis after surgery McBane, Caroline N, PA-C  Active   cefTRIAXone  (ROCEPHIN ) IVPB [id number] Yes Inject 2 g into the vein daily. Indication:  R-shoulder PJI First Dose: Yes Last Day of Therapy:  11/16/2023 Labs - Once weekly:  CBC/D and BMP, Labs - Once weekly: ESR and CRP Method of administration: IV Push Method of  administration may be changed at the discretion of home infusion pharmacist based upon assessment of the patient and/or caregiver's ability to self-administer the medication ordered. McBane, Caroline N, PA-C  Active   daptomycin  (CUBICIN ) IVPB [id number] Yes Inject 700 mg into the vein daily. Indication:  R-shoulder PJI First Dose: Yes Last Day of Therapy:  11/16/23 Labs - Once weekly:  CBC/D, BMP, and CPK Labs - Once weekly: ESR and CRP Method of administration: IV Push Method of administration may be changed at the discretion of home infusion pharmacist based upon assessment of the patient and/or caregiver's ability to self-administer the medication ordered. McBane, Caroline N, PA-C  Active   fluticasone  (FLONASE ) 50 MCG/ACT nasal spray [id number] Yes Place 2 sprays into both nostrils daily as needed for allergies. Thedora Garnette HERO, MD  Active Self  glucose blood test strip [id number] Yes Test fasting blood sugars once a day Thedora Garnette HERO, MD  Active Self  JARDIANCE  25 MG TABS tablet [id number] Yes TAKE 1 TABLET(25 MG) BY MOUTH DAILY Thedora Garnette HERO, MD  Active Self  levothyroxine  (SYNTHROID ) 137 MCG tablet [id number] Yes Take 1 tablet (137 mcg total) by mouth daily before breakfast. Thedora Garnette HERO, MD  Active Self  levothyroxine  (SYNTHROID ) 150 MCG tablet [id number]  Take 150 mcg by mouth daily before breakfast. [provider]  Active Self  meloxicam  (MOBIC ) 15 MG tablet [id number]  Take 1 tablet (15 mg total) by mouth daily. For 2 weeks for pain and inflammation. Then take as needed McBane, Caroline N, PA-C  Active   metFORMIN  (GLUCOPHAGE ) 500 MG tablet [id number]  TAKE 2 TABLETS(1000 MG) BY MOUTH TWICE  DAILY WITH A MEAL Webb, Padonda B, FNP  Active Self  montelukast  (SINGULAIR ) 10 MG tablet [id number] Yes TAKE 1 TABLET BY MOUTH EVERYDAY AT BEDTIME Luke Orlan HERO, DO  Active Self  Multiple Vitamin (MULTIVITAMIN WITH MINERALS) TABS tablet [id number] Yes Take 1 tablet by mouth daily. [provider]  Active Self  omeprazole  (PRILOSEC) 20 MG capsule [id number] Yes Take 1 capsule (20 mg total) by mouth daily. To gastric protection while taking NSAIDs McBane, Caroline N, PA-C  Active   ondansetron  (ZOFRAN ) 4 MG tablet [id number] Yes Take 1 tablet (4 mg total) by mouth every 8 (eight) hours as needed for up to 7 days for nausea or vomiting. McBane, Caroline N, PA-C  Active   oxyCODONE  (OXY IR/ROXICODONE ) 5 MG immediate release tablet [id number] Yes Take 1-2 pills every 6 hrs as needed for severe pain, no more than 6 per day McBane, Caroline N, PA-C  Active   pioglitazone  (ACTOS ) 30 MG tablet [id number] Yes Take 1 tablet (30 mg total) by mouth daily. Thedora Garnette HERO, MD  Active Self  rosuvastatin  (CRESTOR ) 20 MG tablet [id number] Yes TAKE 1 TABLET(20 MG) BY MOUTH DAILY Thedora Garnette HERO, MD  Active Self  SKYRIZI PEN 150 MG/ML SOAJ [id number] Yes Inject 150 mg into the skin every 3 (three) months. [provider]  Active Self  tamsulosin  (FLOMAX ) 0.4 MG CAPS capsule [id number] Yes Take 0.4 mg by mouth daily. [provider]  Active Self  testosterone  cypionate (DEPOTESTOSTERONE CYPIONATE) 200 MG/ML injection [id number] Yes Inject 100 mg into the muscle once a week. [provider]  Active Self  tirzepatide  (MOUNJARO ) 5 MG/0.5ML Pen [id number] Yes Inject 5 mg into the skin once a week. Thedora Garnette HERO, MD  Active Self            Home Care and Equipment/Supplies: Were Home Health Services Ordered?: Yes Name of Home Health Agency:: [facility] Has Agency set up a time to come to your home?: Yes First Home Health Visit Date: 10/09/23 Any new equipment or medical supplies ordered?: No  Functional Questionnaire: Do you need assistance with bathing/showering or dressing?: Yes (spouse assists as needed) Do you need assistance with meal preparation?: No Do you need assistance with eating?: No Do you have difficulty maintaining continence: No Do you need assistance with  getting out of bed/getting out of a chair/moving?: No Do you have difficulty managing or taking your medications?: No  Follow up appointments reviewed: PCP Follow-up appointment confirmed?: No (patient to call) MD Provider Line [phone] Given: No Specialist Hospital Follow-up appointment confirmed?: Yes Date of Specialist follow-up appointment?: 10/15/23 Follow-Up Specialty Provider:: Dr. Cristy Do you need transportation to your follow-up appointment?: No Do you understand care options if your condition(s) worsen?: Yes-patient verbalized understanding (Call MD for: redness, tenderness, or signs of infection (pain, swelling, redness, odor or green/yellow discharge  around incision site)  Call MD for: severe uncontrolled pain  Call MD for: temperature >100.4)  SDOH Interventions Today    Flowsheet Row Most Recent Value  SDOH Interventions   Food Insecurity Interventions Intervention Not Indicated  Housing Interventions Intervention Not Indicated  Transportation Interventions Intervention Not Indicated  Utilities Interventions Intervention Not Indicated    Alexyia Guarino J. Costella Schwarz RN, MSN [facility]  [facility], [facility] RN Care Manager Direct Dial: [phone]  Fax: [phone] Website: [email or web address]

## 2023-10-12 NOTE — Discharge Summary (Signed)
 Patient ID: Shaun Velasquez MRN: [id number] DOB/AGE: 05-08-1960 63 y.o.  Admit date: 10/06/2023 Discharge date: 10/08/23 Admission Diagnoses:right infected total shoulder arthroplasty  Discharge Diagnoses:  Principal Problem:   Status post reverse total arthroplasty of right shoulder   Past Medical History:  Diagnosis Date   Allergy    Arthritis    Collapsed lung    Diabetes mellitus without complication (HCC)    Eczema    GERD (gastroesophageal reflux disease)    Heart murmur    as child   History of chickenpox    Hyperlipemia    Hypothyroidism    PONV (postoperative nausea and vomiting)    Psoriasis    Uncontrolled diabetes mellitus type 2 without complications 05/19/2015     Procedures Performed:  Right revision reverse total Shoulder Arthroplasty modifier 22 Right removal of hardware Right open shoulder synovectomy  Discharged Condition: stable  Hospital Course: Patient brought in for scheduled procedure.  Tolerated procedure well.  He was kept for monitoring overnight for pain control, medical monitoring, ID consultation, and discharge planning. He was found to be stable for DC on Friday after surgery.  Patient was instructed on specific activity restrictions and all questions were answered.  Opiates Today (MME): Today's  total administered Morphine Milligram Equivalents: 0 Opiates Yesterday (MME): Yesterday's total administered Morphine Milligram Equivalents: 0 Opiates Used in the last two days:  Inpatient Morphine Milligram Equivalents Per Day 9/24 - 9/26   Values displayed are in units of MME/Day    Order Start / End Date 9/24 9/25 9/26    fentaNYL  (SUBLIMAZE ) injection 50-100 mcg 9/24 - 9/24 15 of 15-30 -- --    fentaNYL  (SUBLIMAZE ) injection 25-50 mcg 9/24 - 9/24 15 of 45-90 -- --    fentaNYL  (SUBLIMAZE ) injection 9/24 - 9/24 *15 of 15 -- --    HYDROmorphone  (DILAUDID ) injection 0.5-1 mg 9/24 - 9/26 0 of 20-40 0 of 60-120 0 of 50-100    oxyCODONE  (Oxy  IR/ROXICODONE ) immediate release tablet 5-10 mg 9/24 - 9/26 15 of 15-30 30 of 45-90 30 of 37.5-75    oxyCODONE  (Oxy IR/ROXICODONE ) immediate release tablet 10-15 mg 9/24 - 9/26 15 of 30-45 0 of 90-135 0 of 75-112.5    Daily Totals  * 75 of 140-250 30 of 195-345 30 of 162.5-287.5  *One-Step medication     Consults: ID  Significant Diagnostic Studies: Cultures  Treatments: Surgery, PICC Line  Discharge Exam: General: resting in hospital bed, NAD RUE: Dressing CDI and sling well fitting,  full and painless ROM throughout hand with DPC of 0.  Axillary nerve sensation normal.  Disposition: Discharge disposition: 01-Home or Self Care       Discharge Instructions     [facility] pharmacist to adjust dose for Vancomycin , Aminoglycosides and other anti-infective therapies as requested by physician.   Complete by: As directed    [facility] to provide Cath Flo 2mg    Complete by: As directed    Administer for PICC line occlusion and as ordered by physician for other access device issues.   Anaphylaxis Kit: Provided to treat any anaphylactic reaction to the medication being provided to the patient if First Dose or when requested by physician   Complete by: As directed    Epinephrine  1mg /ml vial / amp: Administer 0.3mg  (0.57ml) subcutaneously once for moderate to severe anaphylaxis, nurse to call physician and pharmacy when reaction occurs and call 911 if needed for immediate care   Diphenhydramine  50mg /ml IV vial: Administer  25-50mg  IV/IM PRN for first dose reaction, rash, itching, mild reaction, nurse to call physician and pharmacy when reaction occurs   Sodium Chloride  0.9% NS 500ml IV: Administer if needed for hypovolemic blood pressure drop or as ordered by physician after call to physician with anaphylactic reaction   Call MD for:  redness, tenderness, or signs of infection (pain, swelling, redness, odor or green/yellow discharge around incision site)   Complete by:  As directed    Call MD for:  severe uncontrolled pain   Complete by: As directed    Call MD for:  temperature >100.4   Complete by: As directed    Change dressing on IV access line weekly and PRN   Complete by: As directed    Diet - low sodium heart healthy   Complete by: As directed    Flush IV access with Sodium Chloride  0.9% and Heparin  10 units/ml or 100 units/ml   Complete by: As directed    Home infusion instructions - [facility]   Complete by: As directed    Instructions: Flush IV access with Sodium Chloride  0.9% and Heparin  10units/ml or 100units/ml   Change dressing on IV access line: Weekly and PRN   Instructions Cath Flo 2mg : Administer for PICC Line occlusion and as ordered by physician for other access device   [facility] pharmacist to adjust dose for: Vancomycin , Aminoglycosides and other anti-infective therapies as requested by physician   Method of administration may be changed at the discretion of home infusion pharmacist based upon assessment of the patient and/or caregiver's ability to self-administer the medication ordered   Complete by: As directed       Allergies as of 10/08/2023   No Known Allergies      Medication List     STOP taking these medications    doxycycline  100 MG tablet Commonly known as: VIBRA -TABS       TAKE these medications    acetaminophen  500 MG tablet Commonly known as: TYLENOL  Take 2 tablets (1,000 mg total) by mouth every 8 (eight) hours for 14 days.   albuterol  108 (90 Base) MCG/ACT inhaler Commonly known as: VENTOLIN  HFA Inhale 2 puffs into the lungs every 6 (six) hours as needed for wheezing or shortness of breath.   aspirin  81 MG chewable tablet Commonly known as: Aspirin  Childrens Chew 1 tablet (81 mg total) by mouth 2 (two) times daily. For 6 weeks for DVT prophylaxis after surgery   cefTRIAXone  IVPB Commonly known as: ROCEPHIN  Inject 2 g into the vein daily. Indication:  R-shoulder  PJI First Dose: Yes Last Day of Therapy:  11/16/2023 Labs - Once weekly:  CBC/D and BMP, Labs - Once weekly: ESR and CRP Method of administration: IV Push Method of administration may be changed at the discretion of home infusion pharmacist based upon assessment of the patient and/or caregiver's ability to self-administer the medication ordered.   daptomycin  IVPB Commonly known as: CUBICIN  Inject 700 mg into the vein daily. Indication:  R-shoulder PJI First Dose: Yes Last Day of Therapy:  11/16/23 Labs - Once weekly:  CBC/D, BMP, and CPK Labs - Once weekly: ESR and CRP Method of administration: IV Push Method of administration may be changed at the discretion of home infusion pharmacist based upon assessment of the patient and/or caregiver's ability to self-administer the medication ordered.   fluticasone  50 MCG/ACT nasal spray Commonly known as: FLONASE  Place 2 sprays into both nostrils daily as needed for allergies.   glucose blood test  strip Test fasting blood sugars once a day   Jardiance  25 MG Tabs tablet Generic drug: empagliflozin  TAKE 1 TABLET(25 MG) BY MOUTH DAILY   levothyroxine  150 MCG tablet Commonly known as: SYNTHROID  Take 150 mcg by mouth daily before breakfast.   levothyroxine  137 MCG tablet Commonly known as: SYNTHROID  Take 1 tablet (137 mcg total) by mouth daily before breakfast.   meloxicam  15 MG tablet Commonly known as: MOBIC  Take 1 tablet (15 mg total) by mouth daily. For 2 weeks for pain and inflammation. Then take as needed   metFORMIN  500 MG tablet Commonly known as: GLUCOPHAGE  TAKE 2 TABLETS(1000 MG) BY MOUTH TWICE DAILY WITH A MEAL   montelukast  10 MG tablet Commonly known as: SINGULAIR  TAKE 1 TABLET BY MOUTH EVERYDAY AT BEDTIME What changed: See the new instructions.   multivitamin with minerals Tabs tablet Take 1 tablet by mouth daily.   omeprazole  20 MG capsule Commonly known as: PRILOSEC Take 1 capsule (20 mg total) by mouth daily.  To gastric protection while taking NSAIDs   ondansetron  4 MG tablet Commonly known as: Zofran  Take 1 tablet (4 mg total) by mouth every 8 (eight) hours as needed for up to 7 days for nausea or vomiting.   oxyCODONE  5 MG immediate release tablet Commonly known as: Oxy IR/ROXICODONE  Take 1-2 pills every 6 hrs as needed for severe pain, no more than 6 per day   pioglitazone  30 MG tablet Commonly known as: Actos  Take 1 tablet (30 mg total) by mouth daily.   rosuvastatin  20 MG tablet Commonly known as: CRESTOR  TAKE 1 TABLET(20 MG) BY MOUTH DAILY   Skyrizi Pen 150 MG/ML pen Generic drug: risankizumab-rzaa Inject 150 mg into the skin every 3 (three) months.   tamsulosin  0.4 MG Caps capsule Commonly known as: FLOMAX  Take 0.4 mg by mouth daily.   testosterone  cypionate 200 MG/ML injection Commonly known as: DEPOTESTOSTERONE CYPIONATE Inject 100 mg into the muscle once a week.   tirzepatide  5 MG/0.5ML Pen Commonly known as: MOUNJARO  Inject 5 mg into the skin once a week.               Discharge Care Instructions  (From admission, onward)           Start     Ordered   10/08/23 0000  Change dressing on IV access line weekly and PRN  (Home infusion instructions - [facility] )        10/08/23 1035            Follow-up Information     [facility]  Follow up.   Why: Home Health Skilled Nursing (RN) Contact information: [address] [place] [address] [phone]                  Aleck Stalling, PA-C

## 2023-10-13 MED ORDER — BUPIVACAINE HCL (PF) 0.5 % IJ SOLN
INTRAMUSCULAR | Status: DC | PRN
  Administered 2023-10-06: 10 mL via PERINEURAL

## 2023-10-13 MED ORDER — BUPIVACAINE LIPOSOME 1.3 % IJ SUSP
INTRAMUSCULAR | Status: DC | PRN
  Administered 2023-10-06: 10 mL via PERINEURAL

## 2023-10-13 NOTE — Anesthesia Procedure Notes (Signed)
 Anesthesia Regional Block: Interscalene brachial plexus block   Pre-Anesthetic Checklist: , timeout performed,  Correct Patient, Correct Site, Correct Laterality,  Correct Procedure, Correct Position, site marked,  Risks and benefits discussed,  Surgical consent,  Pre-op evaluation,  At surgeon's request and post-op pain management  Laterality: Right  Prep: chloraprep       Needles:  Injection technique: Single-shot  Needle Type: Echogenic Stimulator Needle     Needle Length: 9cm  Needle Gauge: 22     Additional Needles:   Procedures:,,,, ultrasound used (permanent image in chart),,    Narrative:  Start time: 10/06/2023 1:00 PM End time: 10/06/2023 1:07 PM Injection made incrementally with aspirations every 5 mL.  Performed by: Personally  Anesthesiologist: Corinne Garnette BRAVO, MD  Additional Notes: Functioning IV was confirmed and monitors were applied.  A 90mm 22ga echogenic stimulator needle was used. Sterile prep and drape, hand hygiene, and sterile gloves were used.  Negative aspiration and negative test dose prior to incremental administration of local anesthetic. The patient tolerated the procedure well.  Ultrasound guidance: relevent anatomy identified, needle position confirmed, local anesthetic spread visualized around nerve(s), vascular puncture avoided.  Image printed for medical record.

## 2023-10-13 NOTE — Addendum Note (Signed)
 Addendum  created 10/13/23 1555 by Corinne Garnette BRAVO, MD   Child order released for a procedure order, Clinical Note Signed, Intraprocedure Blocks edited, Intraprocedure Meds edited, SmartForm saved

## 2023-10-27 ENCOUNTER — Other Ambulatory Visit: Payer: Self-pay

## 2023-10-27 ENCOUNTER — Encounter: Payer: Self-pay | Admitting: Infectious Diseases

## 2023-10-27 ENCOUNTER — Ambulatory Visit (INDEPENDENT_AMBULATORY_CARE_PROVIDER_SITE_OTHER): Admitting: Infectious Diseases

## 2023-10-27 ENCOUNTER — Inpatient Hospital Stay: Admitting: Infectious Diseases

## 2023-10-27 VITALS — BP 123/80 | HR 84 | Temp 97.3°F | Ht 70.0 in | Wt 196.0 lb

## 2023-10-27 DIAGNOSIS — T8459XD Infection and inflammatory reaction due to other internal joint prosthesis, subsequent encounter: Secondary | ICD-10-CM

## 2023-10-27 DIAGNOSIS — Z8619 Personal history of other infectious and parasitic diseases: Secondary | ICD-10-CM | POA: Diagnosis not present

## 2023-10-27 DIAGNOSIS — E119 Type 2 diabetes mellitus without complications: Secondary | ICD-10-CM

## 2023-10-27 DIAGNOSIS — Z96619 Presence of unspecified artificial shoulder joint: Secondary | ICD-10-CM

## 2023-10-27 MED ORDER — AMOXICILLIN 500 MG PO CAPS: 1000.0000 mg | ORAL_CAPSULE | Freq: Three times a day (TID) | ORAL | 1 refills | Status: AC

## 2023-10-27 NOTE — Progress Notes (Signed)
 NAME: Shaun Velasquez  DOB: 1960-12-14  MRN: [id number]  Date/Time: 10/27/2023 2:39 PM  Subjective:   ? Shaun Velasquez is a 63 y.o. with fhistory of reverse right shoulder arthroplasty in 2016, , psoriasis on Skyrizi, diabetes mellitus, hypothyroidism, hyperlipidemia was recently in [facility] 9/24-9/26 when he underwent rt revision reverse total shoulder arthroplasty infected shoulder arthroplasty,.With removal of the hardware and right open shoulder synovectomy.  Multiple cultures were sent.  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  He is not experiencing any side effects from the antibiotics, such as rash, nausea, vomiting, or diarrhea. Pain is managed with over-the-counter medications, taking three Advils (200 mg each) and two Tylenols twice a day.  He has a history of a staphylococcal infection on the back of his neck, which was treated with doxycycline .. The infection has resolved.    He is not currently moving his shoulder much and is scheduled to start physical therapy next Wednesday.  He is still Dr. Cristy orthopedic surgeon and sutures are still present.  It to be removed next week. He has a history of diabetes and is currently taking metformin , Mounjaro , Jardiance , Actos  .   Past Medical History:  Diagnosis Date   Allergy    Arthritis    Collapsed lung    Diabetes mellitus without complication (HCC)    Eczema    GERD (gastroesophageal reflux disease)    Heart murmur    as child   History of chickenpox    Hyperlipemia    Hypothyroidism    PONV (postoperative nausea and vomiting)    Psoriasis    Uncontrolled diabetes mellitus type 2 without complications 05/19/2015  History of tickborne illness [place] Has horses Past Surgical History:   Procedure Laterality Date   CHEST TUBE INSERTION     pt had collapsed lung   COLONOSCOPY  2011   on [address], not sure name of the practice(normal)   COLONOSCOPY W/ POLYPECTOMY     CYST REMOVAL HAND     arm; 2024   ELBOW SURGERY Right 2009   EXCISION OF KELOID Right 11/27/2022   Procedure: EXCISION OF SUBCUTANEOUS CYST RIGHT UPPER ARM;  Surgeon: Signe Mitzie LABOR, MD;  Location: [facility];  Service: General;  Laterality: Right;   INCISION AND DRAINAGE PERIRECTAL ABSCESS     left shoulder replacement      2022   REVERSE SHOULDER ARTHROPLASTY Right 12/27/2014   Procedure: RIGHT REVERSE SHOULDER ARTHROPLASTY;  Surgeon: Franky Pointer, MD;  Location: [facility];  Service: Orthopedics;  Laterality: Right;   REVISION TOTAL SHOULDER TO REVERSE TOTAL SHOULDER Right 10/06/2023   Procedure: RIGHT REVISION TOTAL SHOULDER ARTHROPLASTY;  Surgeon: Cristy Bonner DASEN, MD;  Location: [facility];  Service: Orthopedics;  Laterality: Right;   ROTATOR CUFF REPAIR Right 2011   SHOULDER ARTHROSCOPY W/ LABRAL REPAIR Right    SHOULDER ARTHROSCOPY WITH ROTATOR CUFF REPAIR Left 2003   SHOULDER ARTHROSCOPY WITH SUBACROMIAL DECOMPRESSION Left    WISDOM TOOTH EXTRACTION      Social History   Socioeconomic History   Marital status: Married    Spouse name: Not on file   Number of children: 2   Years of education: Not on file   Highest education level: Bachelor's degree (e.g., BA, AB,  BS)  Occupational History   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    Comment: Chief of Staff [employer]  Tobacco Use   Smoking status: Former    Current packs/day: 0.00    Types: Cigarettes    Start date: 1981    Quit date: 1996    Years since quitting: 29.8    Passive exposure: Never   Smokeless tobacco: Never  Vaping Use   Vaping status: Never Used  Substance and Sexual Activity   Alcohol use: No   Drug use: No   Sexual activity: Yes  Other Topics Concern   Not on file  Social History Narrative   Not on file   Social Drivers  of Health   Financial Resource Strain: Low Risk  (07/05/2023)   Overall Financial Resource Strain (CARDIA)    Difficulty of Paying Living Expenses: Not very hard  Food Insecurity: No Food Insecurity (10/11/2023)   Hunger Vital Sign    Worried About Running Out of Food in the Last Year: Never true    Ran Out of Food in the Last Year: Never true  Transportation Needs: No Transportation Needs (10/11/2023)   PRAPARE - Administrator, Civil Service (Medical): No    Lack of Transportation (Non-Medical): No  Physical Activity: Insufficiently Active (07/05/2023)   Exercise Vital Sign    Days of Exercise per Week: 4 days    Minutes of Exercise per Session: 30 min  Stress: No Stress Concern Present (07/05/2023)   [employer] of Occupational Health - Occupational Stress Questionnaire    Feeling of Stress: Not at all  Social Connections: Socially Integrated (10/06/2023)   Social Connection and Isolation Panel    Frequency of Communication with Friends and Family: More than three times a week    Frequency of Social Gatherings with Friends and Family: More than three times a week    Attends Religious Services: More than 4 times per year    Active Member of [employer] or Organizations: Yes    Attends Engineer, structural: More than 4 times per year    Marital Status: Married  Catering manager Violence: Not At Risk (10/11/2023)   Humiliation, Afraid, Rape, and Kick questionnaire    Fear of Current or Ex-Partner: No    Emotionally Abused: No    Physically Abused: No    Sexually Abused: No    Family History  Problem Relation Age of Onset   Allergic rhinitis Mother    Lung cancer Mother    Asthma Sister    Cancer Sister        Skin   Colon cancer Neg Hx    Colon polyps Neg Hx    Esophageal cancer Neg Hx    Stomach cancer Neg Hx    Rectal cancer Neg Hx    No Known Allergies I? Current Outpatient Medications  Medication Sig Dispense Refill   albuterol  (VENTOLIN  HFA) 108  (90 Base) MCG/ACT inhaler Inhale 2 puffs into the lungs every 6 (six) hours as needed for wheezing or shortness of breath. 8 g 2   aspirin  (ASPIRIN  CHILDRENS) 81 MG chewable tablet Chew 1 tablet (81 mg total) by mouth 2 (two) times daily. For 6 weeks for DVT prophylaxis after surgery 84 tablet 0   cefTRIAXone  (ROCEPHIN ) IVPB Inject 2 g into the vein daily. Indication:  R-shoulder PJI First Dose: Yes Last Day of Therapy:  11/16/2023 Labs - Once weekly:  CBC/D and BMP, Labs - Once weekly: ESR and CRP Method of administration: IV  Push Method of administration may be changed at the discretion of home infusion pharmacist based upon assessment of the patient and/or caregiver's ability to self-administer the medication ordered. 39 Units 0   daptomycin  (CUBICIN ) IVPB Inject 700 mg into the vein daily. Indication:  R-shoulder PJI First Dose: Yes Last Day of Therapy:  11/16/23 Labs - Once weekly:  CBC/D, BMP, and CPK Labs - Once weekly: ESR and CRP Method of administration: IV Push Method of administration may be changed at the discretion of home infusion pharmacist based upon assessment of the patient and/or caregiver's ability to self-administer the medication ordered. 39 Units 0   fluticasone  (FLONASE ) 50 MCG/ACT nasal spray Place 2 sprays into both nostrils daily as needed for allergies. 11.1 mL 11   glucose blood test strip Test fasting blood sugars once a day 100 each 3   JARDIANCE  25 MG TABS tablet TAKE 1 TABLET(25 MG) BY MOUTH DAILY 90 tablet 3   levothyroxine  (SYNTHROID ) 137 MCG tablet Take 1 tablet (137 mcg total) by mouth daily before breakfast. 90 tablet 3   levothyroxine  (SYNTHROID ) 150 MCG tablet Take 150 mcg by mouth daily before breakfast.     meloxicam  (MOBIC ) 15 MG tablet Take 1 tablet (15 mg total) by mouth daily. For 2 weeks for pain and inflammation. Then take as needed 30 tablet 0   metFORMIN  (GLUCOPHAGE ) 500 MG tablet TAKE 2 TABLETS(1000 MG) BY MOUTH TWICE DAILY WITH A MEAL 360  tablet 3   montelukast  (SINGULAIR ) 10 MG tablet TAKE 1 TABLET BY MOUTH EVERYDAY AT BEDTIME 30 tablet 5   Multiple Vitamin (MULTIVITAMIN WITH MINERALS) TABS tablet Take 1 tablet by mouth daily.     omeprazole  (PRILOSEC) 20 MG capsule Take 1 capsule (20 mg total) by mouth daily. To gastric protection while taking NSAIDs 30 capsule 0   pioglitazone  (ACTOS ) 30 MG tablet Take 1 tablet (30 mg total) by mouth daily. 90 tablet 3   rosuvastatin  (CRESTOR ) 20 MG tablet TAKE 1 TABLET(20 MG) BY MOUTH DAILY 90 tablet 3   SKYRIZI PEN 150 MG/ML SOAJ Inject 150 mg into the skin every 3 (three) months.     tamsulosin  (FLOMAX ) 0.4 MG CAPS capsule Take 0.4 mg by mouth daily.     testosterone  cypionate (DEPOTESTOSTERONE CYPIONATE) 200 MG/ML injection Inject 100 mg into the muscle once a week.     tirzepatide  (MOUNJARO ) 5 MG/0.5ML Pen Inject 5 mg into the skin once a week. 6 mL 3   No current facility-administered medications for this visit.     Abtx:  Anti-infectives (From admission, onward)    None       REVIEW OF SYSTEMS:  Const: negative fever, negative chills, negative weight loss Eyes: negative diplopia or visual changes, negative eye pain ENT: negative coryza, negative sore throat Resp: negative cough, hemoptysis, dyspnea Cards: negative for chest pain, palpitations, lower extremity edema GU: negative for frequency, dysuria and hematuria GI: Negative for abdominal pain, diarrhea, bleeding, constipation Skin: negative for rash and pruritus Heme: negative for easy bruising and gum/nose bleeding MS: Right shoulder movement restricted. Neurolo:negative for headaches, dizziness, vertigo, memory problems  Psych: negative for feelings of anxiety, depression  Endocrine:  diabetes Allergy/Immunology- negative for any medication or food allergies ? Objective:  VITALS:  BP 123/80   Pulse 84   Temp (!) 97.3 F (36.3 C) (Temporal)   Ht 5' 10 (1.778 m)   Wt 196 lb (88.9 kg)   SpO2 98%   BMI 28.12  kg/m  LDA Left  PICC line present About 1 inch displacement PHYSICAL EXAM:  General: Alert, cooperative, no distress, appears stated age.  Head: Normocephalic, without obvious abnormality, atraumatic. Eyes: Conjunctivae clear, anicteric sclerae. Pupils are equal ENT Nares normal. No drainage or sinus tenderness. Lips, mucosa, and tongue normal. No Thrush Neck: Supple, symmetrical, no adenopathy, thyroid : non tender no carotid bruit and no JVD. Back: No CVA tenderness. Lungs: Clear to auscultation bilaterally. No Wheezing or Rhonchi. No rales. Heart: Regular rate and rhythm, no murmur, rub or gallop. Abdomen: Soft, non-tender,not distended. Bowel sounds normal. No masses Extremities: Right shoulder surgical site looks healthy Well-approximated Sutures present No erythema or tenderness or discharge   skin: No rashes or lesions. Or bruising Lymph: Cervical, supraclavicular normal. Neurologic: Grossly non-focal Pertinent Labs Lab Results 10/13 WBC 6.3, Scr 0.99, ESR 49, CRP 22,  Hb 12.6 CK 239 from 10/18/23   ? 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  He is currently getting ceftriaxone  and IV daptomycin  We can discontinue daptomycin  as of today Will continue ceftriaxone  to finish 6 weeks on 11/16/2023.  After that he will start taking amoxicillin  1 g 3 times a day for another 6 weeks.  And then after 12 weeks of antibiotics we can reduce the amoxicillin  to 500 mg 3 times a day for a prolonged period of time if not indefinitely.  Labs look okay Hemoglobin has come up to 12.6  Diabetes mellitus on multiple medications Follow-up with his PCP   History of Staph aureus skin and soft tissue infection of the nape of the neck has resolved. Discussed the management of the patient in detail Will follow him in 1 month   Note:  This document was  prepared using Dragon voice recognition software and may include unintentional dictation errors.

## 2023-10-27 NOTE — Patient Instructions (Signed)
 VISIT SUMMARY:  You came in today for a follow-up on your shoulder infection and antibiotic treatment. 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  we will stop Daptomycin  from today. You will complete this course on November 4th and then switch to oral amoxicillin . Your pain is being managed with over-the-counter medications, and you are scheduled to start physical therapy next Wednesday. Your blood glucose levels have been fluctuating due to recent hospitalization, but they are stabilizing now. Your post-surgical anemia is improving, and the cellulitis on the back of your neck is resolving.  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 You will continue intravenous ceftriaxone  until November 4th, then switch to oral amoxicillin  1 gram three times a day starting November 5th for six weeks, and then reduce to 500 mg three times a day. Long-term antibiotic therapy may be necessary to prevent reinfection. Stop Daptomycin   as of today  -CELLULITIS OF NAPE OF NECK: A bacterial skin infection on the back of your neck has resolved. You do not need to restart doxycycline  or clindamycin .  -ANEMIA FOLLOWING SURGERY: A condition where you have a lower than normal number of red blood cells, which is improving after your surgery. Your hemoglobin levels have increased from 10.7 to 12.6, indicating recovery.  -TYPE 2 DIABETES MELLITUS: A condition that affects how your body processes blood sugar. Your blood glucose levels have been fluctuating but are now stabilizing. Continue with your current diabetes management regimen.  INSTRUCTIONS:  Complete your intravenous ceftriaxone  treatment by November 4th and then start oral amoxicillin  1 gram three times a day from November 5th for six weeks, then reduce to 500 mg three times a day. Begin physical therapy  next Wednesday. Continue with your current diabetes management regimen.

## 2023-11-08 ENCOUNTER — Encounter: Payer: Self-pay | Admitting: Infectious Diseases

## 2023-11-30 ENCOUNTER — Ambulatory Visit: Admitting: Family Medicine

## 2023-11-30 ENCOUNTER — Ambulatory Visit: Payer: Self-pay | Admitting: Family Medicine

## 2023-11-30 ENCOUNTER — Encounter: Payer: Self-pay | Admitting: Family Medicine

## 2023-11-30 VITALS — BP 116/76 | HR 81 | Temp 97.9°F | Ht 70.0 in | Wt 197.0 lb

## 2023-11-30 DIAGNOSIS — E785 Hyperlipidemia, unspecified: Secondary | ICD-10-CM | POA: Diagnosis not present

## 2023-11-30 DIAGNOSIS — Z7984 Long term (current) use of oral hypoglycemic drugs: Secondary | ICD-10-CM

## 2023-11-30 DIAGNOSIS — Z23 Encounter for immunization: Secondary | ICD-10-CM

## 2023-11-30 DIAGNOSIS — E119 Type 2 diabetes mellitus without complications: Secondary | ICD-10-CM | POA: Diagnosis not present

## 2023-11-30 DIAGNOSIS — E039 Hypothyroidism, unspecified: Secondary | ICD-10-CM | POA: Diagnosis not present

## 2023-11-30 DIAGNOSIS — T8459XD Infection and inflammatory reaction due to other internal joint prosthesis, subsequent encounter: Secondary | ICD-10-CM

## 2023-11-30 DIAGNOSIS — Z7985 Long-term (current) use of injectable non-insulin antidiabetic drugs: Secondary | ICD-10-CM

## 2023-11-30 DIAGNOSIS — E291 Testicular hypofunction: Secondary | ICD-10-CM | POA: Diagnosis not present

## 2023-11-30 DIAGNOSIS — T8459XA Infection and inflammatory reaction due to other internal joint prosthesis, initial encounter: Secondary | ICD-10-CM | POA: Insufficient documentation

## 2023-11-30 DIAGNOSIS — Z96619 Presence of unspecified artificial shoulder joint: Secondary | ICD-10-CM

## 2023-11-30 DIAGNOSIS — R809 Proteinuria, unspecified: Secondary | ICD-10-CM | POA: Insufficient documentation

## 2023-11-30 LAB — LIPID PANEL
Cholesterol: 174 mg/dL (ref 0–200)
HDL: 67.5 mg/dL (ref >39.00)
LDL Cholesterol: 92 mg/dL (ref 0–99)
NonHDL: 106.05
Total CHOL/HDL Ratio: 3
Triglycerides: 71 mg/dL (ref 0.0–149.0)
VLDL: 14.2 mg/dL (ref 0.0–40.0)

## 2023-11-30 LAB — TESTOSTERONE: Testosterone: 194.85 ng/dL — ABNORMAL LOW (ref 300.00–890.00)

## 2023-11-30 LAB — CBC
HCT: 43 % (ref 39.0–52.0)
Hemoglobin: 14.3 g/dL (ref 13.0–17.0)
MCHC: 33.3 g/dL (ref 30.0–36.0)
MCV: 85.4 fl (ref 78.0–100.0)
Platelets: 209 K/uL (ref 150.0–400.0)
RBC: 5.04 Mil/uL (ref 4.22–5.81)
RDW: 16.3 % — ABNORMAL HIGH (ref 11.5–15.5)
WBC: 4.5 K/uL (ref 4.0–10.5)

## 2023-11-30 LAB — TSH: TSH: 3.97 u[IU]/mL (ref 0.35–5.50)

## 2023-11-30 LAB — URINALYSIS, ROUTINE W REFLEX MICROSCOPIC
Bilirubin Urine: NEGATIVE
Hgb urine dipstick: NEGATIVE
Ketones, ur: NEGATIVE
Leukocytes,Ua: NEGATIVE
Nitrite: NEGATIVE
RBC / HPF: NONE SEEN (ref 0–?)
Specific Gravity, Urine: 1.015 (ref 1.000–1.030)
Total Protein, Urine: NEGATIVE
Urine Glucose: >=1000 — AB
Urobilinogen, UA: 0.2 (ref 0.0–1.0)
WBC, UA: NONE SEEN (ref 0–?)
pH: 6 (ref 5.0–8.0)

## 2023-11-30 LAB — HEMOGLOBIN A1C: Hgb A1c MFr Bld: 6.5 % (ref 4.6–6.5)

## 2023-11-30 LAB — BASIC METABOLIC PANEL WITH GFR
BUN: 21 mg/dL (ref 6–23)
CO2: 25 meq/L (ref 19–32)
Calcium: 9.7 mg/dL (ref 8.4–10.5)
Chloride: 99 meq/L (ref 96–112)
Creatinine, Ser: 0.96 mg/dL (ref 0.40–1.50)
GFR: 83.49 mL/min (ref >60.00)
Glucose, Bld: 112 mg/dL — ABNORMAL HIGH (ref 70–99)
Potassium: 4.3 meq/L (ref 3.5–5.1)
Sodium: 134 meq/L — ABNORMAL LOW (ref 135–145)

## 2023-11-30 LAB — MICROALBUMIN / CREATININE URINE RATIO
Creatinine,U: 75.2 mg/dL
Microalb Creat Ratio: 69 mg/g — ABNORMAL HIGH (ref 0.0–30.0)
Microalb, Ur: 5.2 mg/dL — ABNORMAL HIGH (ref 0.0–1.9)

## 2023-11-30 MED ORDER — TESTOSTERONE CYPIONATE 200 MG/ML IM SOLN: 100.0000 mg | INTRAMUSCULAR | 5 refills | Status: AC

## 2023-11-30 NOTE — Progress Notes (Signed)
 [facility] [address] [place] [address] Dept: [phone] Dept Fax: [phone]  Chronic Care Office Visit  Subjective:    Patient ID: Shaun Velasquez, male    DOB: [birth year], 63 y.o..   MRN: [id number]  Chief Complaint  Patient presents with   Diabetes    3 month f/u DM.  No concerns.    History of Present Illness:  Patient is in today for reassessment of chronic medical conditions.   Shaun Velasquez has a history of Type 2 diabetes. He is managed on metformin  500 mg two tablets bid, empagliflozin  (Jardiance ) 25 mg daily, pioglitazone  30 mg daily, and tirzepatide  (Mounjaro ) 5 mg weekly.   Shaun Velasquez has a history of  hyperlipidemia. He is managed on rosuvastatin  20 mg daily.   Shaun Velasquez has a history of hypothyroidism, managed on levothyroxine  137 mcg daily.  Shaun Velasquez has a history of hypogonadism. He is managed on testosterone  cypionate 100 mg IM weekly. He notes he did not take his injection last week.   Shaun Velasquez underwent a right reverse total shoulder arthroplasty in 2016. He had issues with recurrent right axillary cellulitis secondary to sebaceous cysts and had excision of this in 11/2022. However, he had a recurrence of cellulitis in June and Sept. of this year. He underwent a revision of his shoulder joint replacement on 10/06/2023. Cultures had returned showing growth of Cutibacterium acnes. 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   Nocturnal leg cramps 07/05/2023   Tick bite of abdominal wall 06/21/2023   Cellulitis of right axilla 10/22/2022   Psoriasis 06/24/2020   Seborrhea 06/24/2020   Primary male hypogonadism 05/22/2016   Perennial and seasonal allergic rhinitis 08/27/2015   Overweight  (BMI 25.0-29.9) 05/30/2015   Hyperlipidemia 05/19/2015   Hypothyroidism 05/19/2015   Type 2 diabetes mellitus without complication, without long-term current use of insulin  (HCC) 05/19/2015   S/P shoulder replacement 12/27/2014   Past Surgical History:  Procedure Laterality Date   CHEST TUBE INSERTION     pt had collapsed lung   COLONOSCOPY  2011   on [address], not sure name of the practice(normal)   COLONOSCOPY W/ POLYPECTOMY     CYST REMOVAL HAND     arm; 2024   ELBOW SURGERY Right 2009   EXCISION OF KELOID Right 11/27/2022   Procedure: EXCISION OF SUBCUTANEOUS CYST RIGHT UPPER ARM;  Surgeon: Signe Mitzie LABOR, MD;  Location: [facility];  Service: General;  Laterality: Right;   INCISION AND DRAINAGE PERIRECTAL ABSCESS     left shoulder replacement      2022   REVERSE SHOULDER ARTHROPLASTY Right 12/27/2014   Procedure: RIGHT REVERSE SHOULDER ARTHROPLASTY;  Surgeon: Franky Pointer, MD;  Location: [facility];  Service: Orthopedics;  Laterality: Right;   REVISION TOTAL SHOULDER TO REVERSE TOTAL SHOULDER Right 10/06/2023   Procedure: RIGHT REVISION TOTAL SHOULDER ARTHROPLASTY;  Surgeon: Cristy Bonner DASEN, MD;  Location: [facility];  Service: Orthopedics;  Laterality: Right;   ROTATOR CUFF REPAIR Right 2011   SHOULDER ARTHROSCOPY W/ LABRAL REPAIR Right    SHOULDER ARTHROSCOPY WITH ROTATOR CUFF REPAIR Left 2003   SHOULDER ARTHROSCOPY WITH SUBACROMIAL DECOMPRESSION Left    WISDOM TOOTH EXTRACTION     Family  History  Problem Relation Age of Onset   Allergic rhinitis Mother    Lung cancer Mother    Asthma Sister    Cancer Sister        Skin   Colon cancer Neg Hx    Colon polyps Neg Hx    Esophageal cancer Neg Hx    Stomach cancer Neg Hx    Rectal cancer Neg Hx    Outpatient Medications Prior to Visit  Medication Sig Dispense Refill   albuterol  (VENTOLIN  HFA) 108 (90 Base) MCG/ACT inhaler Inhale 2 puffs into the lungs every 6 (six) hours as needed for wheezing or shortness of breath. 8 g 2    amoxicillin  (AMOXIL ) 500 MG capsule Take 2 capsules (1,000 mg total) by mouth 3 (three) times daily. 180 capsule 1   fluticasone  (FLONASE ) 50 MCG/ACT nasal spray Place 2 sprays into both nostrils daily as needed for allergies. 11.1 mL 11   glucose blood test strip Test fasting blood sugars once a day 100 each 3   JARDIANCE  25 MG TABS tablet TAKE 1 TABLET(25 MG) BY MOUTH DAILY 90 tablet 3   levothyroxine  (SYNTHROID ) 137 MCG tablet Take 1 tablet (137 mcg total) by mouth daily before breakfast. 90 tablet 3   levothyroxine  (SYNTHROID ) 150 MCG tablet Take 150 mcg by mouth daily before breakfast.     meloxicam  (MOBIC ) 15 MG tablet Take 1 tablet (15 mg total) by mouth daily. For 2 weeks for pain and inflammation. Then take as needed 30 tablet 0   metFORMIN  (GLUCOPHAGE ) 500 MG tablet TAKE 2 TABLETS(1000 MG) BY MOUTH TWICE DAILY WITH A MEAL 360 tablet 3   montelukast  (SINGULAIR ) 10 MG tablet TAKE 1 TABLET BY MOUTH EVERYDAY AT BEDTIME 30 tablet 5   Multiple Vitamin (MULTIVITAMIN WITH MINERALS) TABS tablet Take 1 tablet by mouth daily.     pioglitazone  (ACTOS ) 30 MG tablet Take 1 tablet (30 mg total) by mouth daily. 90 tablet 3   rosuvastatin  (CRESTOR ) 20 MG tablet TAKE 1 TABLET(20 MG) BY MOUTH DAILY 90 tablet 3   SKYRIZI PEN 150 MG/ML SOAJ Inject 150 mg into the skin every 3 (three) months.     tamsulosin  (FLOMAX ) 0.4 MG CAPS capsule Take 0.4 mg by mouth daily.     testosterone  cypionate (DEPOTESTOSTERONE CYPIONATE) 200 MG/ML injection Inject 100 mg into the muscle once a week.     tirzepatide  (MOUNJARO ) 5 MG/0.5ML Pen Inject 5 mg into the skin once a week. 6 mL 3   No facility-administered medications prior to visit.   No Known Allergies   Objective:   Today's Vitals   11/30/23 0827  BP: 116/76  Pulse: 81  Temp: 97.9 F (36.6 C)  TempSrc: Temporal  SpO2: 100%  Weight: 197 lb (89.4 kg)  Height: 5' 10 (1.778 m)   Body mass index is 28.27 kg/m.   General: Well developed, well nourished. No  acute distress. Psych: Alert and oriented. Normal mood and affect.  Health Maintenance Due  Topic Date Due   HIV Screening  Never done   Diabetic kidney evaluation - Urine ACR  Never done   Zoster Vaccines- Shingrix (1 of 2) Never done   Pneumococcal Vaccine: 50+ Years (2 of 2 - PCV) 10/09/2017   DTaP/Tdap/Td (2 - Td or Tdap) 06/29/2022   Influenza Vaccine  08/13/2023   COVID-19 Vaccine (4 - 2025-26 season) 09/13/2023     Assessment & Plan:   Problem List Items Addressed This Visit  Endocrine   Hypothyroidism   I will repeat his TSH today. Continue levothyroxine  137 mcg daily.      Relevant Orders   TSH   Primary male hypogonadism   I will have Shaun Velasquez return for a testosterone  level mid dose. I will check a CBC today for monitoring. Continue testosterone  cypionate 100 mg IM weekly.      Relevant Medications   testosterone  cypionate (DEPOTESTOSTERONE CYPIONATE) 200 MG/ML injection   Other Relevant Orders   CBC   Testosterone    Type 2 diabetes mellitus without complication, without long-term current use of insulin  (HCC) - Primary   I will check annual DM labs today. Continue metformin  500 mg two tablets bid, empagliflozin  (Jardiance ) 25 mg daily, pioglitazone  30 mg daily and tirzepatide  (Mounjaro ) 5 mg weekly.       Relevant Orders   Lipid panel   Microalbumin / creatinine urine ratio   Basic metabolic panel with GFR   Hemoglobin A1c   Urinalysis, Routine w reflex microscopic     Musculoskeletal and Integument   Infection of prosthetic shoulder joint   Continue amoxicillin . Continue to follow with Dr. Fayette.        Other   Hyperlipidemia   I will check lipids today. Continue rosuvastatin  20 mg daily.      Relevant Orders   Lipid panel   Other Visit Diagnoses       Need for immunization against influenza       Relevant Orders   Flu vaccine trivalent PF, 6mos and older(Flulaval,Afluria,Fluarix,Fluzone) (Completed)       Return in about 3  months (around 03/01/2024) for Reassessment.   Garnette CHRISTELLA Simpler, MD  I,Emily Lagle,acting as a scribe for Garnette CHRISTELLA Simpler, MD.,have documented all relevant documentation on the behalf of Garnette CHRISTELLA Simpler, MD.  I, Garnette CHRISTELLA Simpler, MD, have reviewed all documentation for this visit. The documentation on 11/30/2023 for the exam, diagnosis, procedures, and orders are all accurate and complete.

## 2023-11-30 NOTE — Assessment & Plan Note (Signed)
 I will have Mr. Yeakle return for a testosterone  level mid dose. I will check a CBC today for monitoring. Continue testosterone  cypionate 100 mg IM weekly.

## 2023-11-30 NOTE — Assessment & Plan Note (Signed)
 I will check lipids today. Continue rosuvastatin 20 mg daily.

## 2023-11-30 NOTE — Assessment & Plan Note (Addendum)
 I will check annual DM labs today. Continue metformin  500 mg two tablets bid, empagliflozin  (Jardiance ) 25 mg daily, pioglitazone  30 mg daily and tirzepatide  (Mounjaro ) 5 mg weekly.

## 2023-11-30 NOTE — Assessment & Plan Note (Signed)
 Continue amoxicillin . Continue to follow with Dr. Fayette.

## 2023-11-30 NOTE — Assessment & Plan Note (Signed)
 I will repeat his TSH today. Continue levothyroxine  137 mcg daily.

## 2023-11-30 NOTE — Addendum Note (Signed)
 Addended by: THEDORA GARNETTE HERO on: 11/30/2023 04:39 PM   Modules accepted: Orders

## 2023-11-30 NOTE — Assessment & Plan Note (Deleted)
 Clinically, this area remains infected. He should continue hot packs.

## 2023-12-01 ENCOUNTER — Encounter: Payer: Self-pay | Admitting: Infectious Diseases

## 2023-12-01 ENCOUNTER — Ambulatory Visit: Admitting: Infectious Diseases

## 2023-12-01 ENCOUNTER — Other Ambulatory Visit: Payer: Self-pay

## 2023-12-01 VITALS — BP 100/68 | HR 88 | Temp 98.0°F | Ht 69.5 in | Wt 198.4 lb

## 2023-12-01 DIAGNOSIS — T8450XD Infection and inflammatory reaction due to unspecified internal joint prosthesis, subsequent encounter: Secondary | ICD-10-CM

## 2023-12-01 DIAGNOSIS — E785 Hyperlipidemia, unspecified: Secondary | ICD-10-CM | POA: Diagnosis not present

## 2023-12-01 MED ORDER — AMOXICILLIN 500 MG PO CAPS
500.0000 mg | ORAL_CAPSULE | Freq: Three times a day (TID) | ORAL | 5 refills | Status: AC
Start: 2023-12-28 — End: ?

## 2023-12-01 NOTE — Progress Notes (Signed)
 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 with fhistory of reverse right shoulder arthroplasty in 2016, , psoriasis on Skyrizi, diabetes mellitus, hypothyroidism, hyperlipidemia was recently in [facility] 9/24-9/26 when he underwent rt revision reverse total shoulder arthroplasty infected shoulder arthroplasty,.With removal of the hardware and right open shoulder synovectomy.  Multiple cultures were sent.  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  He is not experiencing any side effects from the antibiotics, such as rash, nausea, vomiting, or diarrhea. Pain is managed with over-the-counter medications, taking three Advils (200 mg each) and two Tylenols twice a day.  He has a history of a staphylococcal infection on the back of his neck, which was treated with doxycycline .SABRA The infection has resolved.    He is not currently moving his shoulder much and is scheduled to start physical therapy next Wednesday.  He is still Dr. Cristy orthopedic surgeon and sutures are still present.  It to be removed next week. He has a history of diabetes and is currently taking metformin , Mounjaro , Jardiance , Actos  .   Past Medical History:  Diagnosis Date   Allergy    Arthritis 2003   Collapsed lung    Diabetes mellitus without complication (HCC) 2017   Eczema    GERD (gastroesophageal reflux disease) 2012   Heart murmur    as child    History of chickenpox    Hyperlipemia    Hypothyroidism    PONV (postoperative nausea and vomiting)    Psoriasis    Uncontrolled diabetes mellitus type 2 without complications 05/19/2015  History of tickborne illness [place] Has horses Past Surgical History:  Procedure Laterality Date   CHEST TUBE INSERTION     pt had collapsed lung   COLONOSCOPY  2011   on [address], not sure name of the practice(normal)   COLONOSCOPY W/ POLYPECTOMY     CYST REMOVAL HAND     arm; 2024   ELBOW SURGERY Right 2009   EXCISION OF KELOID Right 11/27/2022   Procedure: EXCISION OF SUBCUTANEOUS CYST RIGHT UPPER ARM;  Surgeon: Signe Mitzie LABOR, MD;  Location: [facility];  Service: General;  Laterality: Right;   INCISION AND DRAINAGE PERIRECTAL ABSCESS     JOINT REPLACEMENT  2016   Also, 2022 and revision 2025   left shoulder replacement      2022   REVERSE SHOULDER ARTHROPLASTY Right 12/27/2014   Procedure: RIGHT REVERSE SHOULDER ARTHROPLASTY;  Surgeon: Franky Pointer, MD;  Location: [facility];  Service: Orthopedics;  Laterality: Right;   REVISION TOTAL SHOULDER TO REVERSE TOTAL SHOULDER Right 10/06/2023   Procedure: RIGHT REVISION TOTAL SHOULDER ARTHROPLASTY;  Surgeon: Cristy Bonner DASEN, MD;  Location: [facility];  Service: Orthopedics;  Laterality: Right;   ROTATOR CUFF REPAIR Right 2011   SHOULDER ARTHROSCOPY W/ LABRAL REPAIR Right    SHOULDER ARTHROSCOPY WITH ROTATOR CUFF REPAIR Left 2003   SHOULDER ARTHROSCOPY WITH  SUBACROMIAL DECOMPRESSION Left    WISDOM TOOTH EXTRACTION      Social History   Socioeconomic History   Marital status: Married    Spouse name: Not on file   Number of children: 2   Years of education: Not on file   Highest education level: Bachelor's degree (e.g., BA, AB, BS)  Occupational History   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  Tobacco Use   Smoking status: Former    Current packs/day: 0.00    Types: Cigarettes    Start date: 1981     Quit date: 1996    Years since quitting: 29.9    Passive exposure: Never   Smokeless tobacco: Never  Vaping Use   Vaping status: Never Used  Substance and Sexual Activity   Alcohol use: No   Drug use: No   Sexual activity: Yes  Other Topics Concern   Not on file  Social History Narrative   Not on file   Social Drivers of Health   Financial Resource Strain: Low Risk  (07/05/2023)   Overall Financial Resource Strain (CARDIA)    Difficulty of Paying Living Expenses: Not very hard  Food Insecurity: No Food Insecurity (10/11/2023)   Hunger Vital Sign    Worried About Running Out of Food in the Last Year: Never true    Ran Out of Food in the Last Year: Never true  Transportation Needs: No Transportation Needs (10/11/2023)   PRAPARE - Administrator, Civil Service (Medical): No    Lack of Transportation (Non-Medical): No  Physical Activity: Insufficiently Active (07/05/2023)   Exercise Vital Sign    Days of Exercise per Week: 4 days    Minutes of Exercise per Session: 30 min  Stress: No Stress Concern Present (07/05/2023)   [employer] of Occupational Health - Occupational Stress Questionnaire    Feeling of Stress: Not at all  Social Connections: Socially Integrated (10/06/2023)   Social Connection and Isolation Panel    Frequency of Communication with Friends and Family: More than three times a week    Frequency of Social Gatherings with Friends and Family: More than three times a week    Attends Religious Services: More than 4 times per year    Active Member of [employer] or Organizations: Yes    Attends Engineer, Structural: More than 4 times per year    Marital Status: Married  Catering Manager Violence: Not At Risk (10/11/2023)   Humiliation, Afraid, Rape, and Kick questionnaire    Fear of Current or Ex-Partner: No    Emotionally Abused: No    Physically Abused: No    Sexually Abused: No    Family History  Problem Relation Age of Onset   Allergic  rhinitis Mother    Lung cancer Mother    Cancer Mother    Asthma Sister    Cancer Sister        Skin   Colon cancer Neg Hx    Colon polyps Neg Hx    Esophageal cancer Neg Hx    Stomach cancer Neg Hx    Rectal cancer Neg Hx    No Known Allergies I? Current Outpatient Medications  Medication Sig Dispense Refill   albuterol  (VENTOLIN  HFA) 108 (90 Base) MCG/ACT inhaler Inhale 2 puffs into the lungs every 6 (six) hours as needed for wheezing or shortness of breath. 8 g 2   amoxicillin  (AMOXIL ) 500 MG capsule Take 2 capsules (1,000 mg total) by  mouth 3 (three) times daily. 180 capsule 1   Docusate Sodium  (DSS) 100 MG CAPS TAKE 1 CAPSULE BY MOUTH 2 TIMES DAILY. OKAY TO DECREASE OR STOP IF HAVING LOOSE BOWEL MOVEMENTS     fluticasone  (FLONASE ) 50 MCG/ACT nasal spray Place 2 sprays into both nostrils daily as needed for allergies. 11.1 mL 11   glucose blood test strip Test fasting blood sugars once a day 100 each 3   JARDIANCE  25 MG TABS tablet TAKE 1 TABLET(25 MG) BY MOUTH DAILY 90 tablet 3   levothyroxine  (SYNTHROID ) 137 MCG tablet Take 1 tablet (137 mcg total) by mouth daily before breakfast. 90 tablet 3   meloxicam  (MOBIC ) 15 MG tablet Take 1 tablet (15 mg total) by mouth daily. For 2 weeks for pain and inflammation. Then take as needed 30 tablet 0   metFORMIN  (GLUCOPHAGE ) 500 MG tablet TAKE 2 TABLETS(1000 MG) BY MOUTH TWICE DAILY WITH A MEAL 360 tablet 3   montelukast  (SINGULAIR ) 10 MG tablet TAKE 1 TABLET BY MOUTH EVERYDAY AT BEDTIME 30 tablet 5   Multiple Vitamin (MULTIVITAMIN WITH MINERALS) TABS tablet Take 1 tablet by mouth daily.     omeprazole  (PRILOSEC) 20 MG capsule Take 1 capsule (20 mg total) by mouth daily. To gastric protection while taking NSAIDs 30 capsule 0   pioglitazone  (ACTOS ) 30 MG tablet Take 1 tablet (30 mg total) by mouth daily. 90 tablet 3   rosuvastatin  (CRESTOR ) 20 MG tablet TAKE 1 TABLET(20 MG) BY MOUTH DAILY 90 tablet 3   SKYRIZI PEN 150 MG/ML SOAJ Inject 150 mg  into the skin every 3 (three) months.     tamsulosin  (FLOMAX ) 0.4 MG CAPS capsule Take 0.4 mg by mouth daily.     testosterone  cypionate (DEPOTESTOSTERONE CYPIONATE) 200 MG/ML injection Inject 0.5 mLs (100 mg total) into the muscle once a week. 10 mL 5   tirzepatide  (MOUNJARO ) 5 MG/0.5ML Pen Inject 5 mg into the skin once a week. 6 mL 3   No current facility-administered medications for this visit.     Abtx:  Anti-infectives (From admission, onward)    None       REVIEW OF SYSTEMS:  Const: negative fever, negative chills, negative weight loss Eyes: negative diplopia or visual changes, negative eye pain ENT: negative coryza, negative sore throat Resp: negative cough, hemoptysis, dyspnea Cards: negative for chest pain, palpitations, lower extremity edema GU: negative for frequency, dysuria and hematuria GI: Negative for abdominal pain, diarrhea, bleeding, constipation Skin: negative for rash and pruritus Heme: negative for easy bruising and gum/nose bleeding MS: Right shoulder movement improved a lot Neurolo:negative for headaches, dizziness, vertigo, memory problems  Psych: negative for feelings of anxiety, depression  Endocrine:  diabetes Allergy/Immunology- negative for any medication or food allergies ? Objective:  VITALS:  BP 100/68   Pulse 88   Temp 98 F (36.7 C) (Temporal)   Ht 5' 9.5 (1.765 m)   Wt 198 lb 6.6 oz (90 kg)   BMI 28.88 kg/m   PHYSICAL EXAM:  General: Alert, cooperative, no distress, appears stated age.  Head: Normocephalic, without obvious abnormality, atraumatic. Eyes: Conjunctivae clear, anicteric sclerae. Pupils are equal ENT Nares normal. No drainage or sinus tenderness. Lips, mucosa, and tongue normal. No Thrush Neck: Supple, symmetrical, no adenopathy, thyroid : non tender no carotid bruit and no JVD. Back: No CVA tenderness. Lungs: Clear to auscultation bilaterally. No Wheezing or Rhonchi. No rales. Heart: Regular rate and rhythm, no  murmur, rub or gallop. Abdomen: Soft, non-tender,not distended. Bowel sounds  normal. No masses Extremities: Right shoulder surgical site healed completely-  Range of movt almost full skin: No rashes or lesions. Or bruising Lymph: Cervical, supraclavicular normal. Neurologic: Grossly non-focal Pertinent Labs Lab Results 11/18  WBC 4.5, Scr 0.96,  Hb 14.3 10/8 ESR 49, CRP 22,     ? 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  He completed 6 weeks of ceftriaxone  on 11/16/23 and has been on 1 gram of amoxicillin  since 11/5 for 6 weeks until 12/27/23.  SABRA  And then after that we can reduce the amoxicillin  to 500 mg 3 times a day for a prolonged period of time if not indefinitely.  Will get ESR and CRP today  Diabetes mellitus on multiple medications Saw PCP yesterday  HLD on crestor    History of Staph aureus skin and soft tissue infection of the nape of the neck has resolved. Discussed the management of the patient in detail Will follow him in 4 month   Note:  This document was prepared using Dragon voice recognition software and may include unintentional dictation errors.

## 2023-12-01 NOTE — Patient Instructions (Addendum)
 You are here for follow up of the rt shoulder infection you will complete 1 gram TID on 12/28/23- after that you will take 500mg  TID for an indefinite period of time- will discuss that the next time I see you

## 2023-12-02 ENCOUNTER — Ambulatory Visit: Payer: Self-pay | Admitting: Infectious Diseases

## 2023-12-02 LAB — SEDIMENTATION RATE: Sed Rate: 6 mm/h (ref 0–20)

## 2023-12-02 LAB — C-REACTIVE PROTEIN: CRP: <3 mg/L (ref <8.0)

## 2023-12-27 ENCOUNTER — Other Ambulatory Visit: Payer: Self-pay | Admitting: Family Medicine

## 2023-12-27 DIAGNOSIS — E039 Hypothyroidism, unspecified: Secondary | ICD-10-CM

## 2023-12-28 ENCOUNTER — Other Ambulatory Visit: Payer: Self-pay | Admitting: Family Medicine

## 2023-12-28 DIAGNOSIS — E119 Type 2 diabetes mellitus without complications: Secondary | ICD-10-CM

## 2024-01-16 ENCOUNTER — Other Ambulatory Visit: Payer: Self-pay | Admitting: Family Medicine

## 2024-01-16 DIAGNOSIS — E119 Type 2 diabetes mellitus without complications: Secondary | ICD-10-CM

## 2024-02-18 ENCOUNTER — Encounter: Payer: Self-pay | Admitting: Infectious Diseases

## 2024-03-01 ENCOUNTER — Ambulatory Visit: Admitting: Family Medicine

## 2024-03-30 ENCOUNTER — Ambulatory Visit: Admitting: Infectious Diseases
